# Patient Record
Sex: Male | Born: 1940
Health system: Southern US, Community
[De-identification: ages and names within clinical notes are randomized; demographics above are authoritative.]

## PROBLEM LIST (undated history)

## (undated) DIAGNOSIS — C4491 Basal cell carcinoma of skin, unspecified: Secondary | ICD-10-CM

## (undated) DIAGNOSIS — Z87891 Personal history of nicotine dependence: Secondary | ICD-10-CM

## (undated) DIAGNOSIS — N529 Male erectile dysfunction, unspecified: Secondary | ICD-10-CM

## (undated) DIAGNOSIS — I10 Essential (primary) hypertension: Secondary | ICD-10-CM

## (undated) DIAGNOSIS — N200 Calculus of kidney: Secondary | ICD-10-CM

## (undated) DIAGNOSIS — K219 Gastro-esophageal reflux disease without esophagitis: Secondary | ICD-10-CM

## (undated) DIAGNOSIS — M1990 Unspecified osteoarthritis, unspecified site: Secondary | ICD-10-CM

## (undated) DIAGNOSIS — E785 Hyperlipidemia, unspecified: Secondary | ICD-10-CM

## (undated) HISTORY — PX: COLONOSCOPY: SHX174

## (undated) HISTORY — DX: Male erectile dysfunction, unspecified: N52.9

## (undated) HISTORY — DX: Unspecified osteoarthritis, unspecified site: M19.90

## (undated) HISTORY — DX: Personal history of nicotine dependence: Z87.891

## (undated) HISTORY — PX: KNEE ARTHROSCOPY: SUR90

## (undated) HISTORY — PX: UPPER GASTROINTESTINAL ENDOSCOPY: SHX188

## (undated) HISTORY — DX: Calculus of kidney: N20.0

## (undated) HISTORY — PX: APPENDECTOMY: SHX54

## (undated) HISTORY — DX: Hyperlipidemia, unspecified: E78.5

## (undated) HISTORY — DX: Essential (primary) hypertension: I10

## (undated) HISTORY — DX: Basal cell carcinoma of skin, unspecified: C44.91

## (undated) HISTORY — DX: Gastro-esophageal reflux disease without esophagitis: K21.9

---

## 2002-12-26 ENCOUNTER — Ambulatory Visit (HOSPITAL_COMMUNITY): Admission: RE | Admit: 2002-12-26 | Discharge: 2002-12-26 | Payer: Self-pay | Admitting: Gastroenterology

## 2006-05-31 ENCOUNTER — Ambulatory Visit: Payer: Self-pay | Admitting: Family Medicine

## 2006-10-03 ENCOUNTER — Ambulatory Visit: Payer: Self-pay | Admitting: Family Medicine

## 2007-01-24 ENCOUNTER — Ambulatory Visit: Payer: Self-pay | Admitting: Family Medicine

## 2007-05-24 ENCOUNTER — Ambulatory Visit: Payer: Self-pay | Admitting: Family Medicine

## 2007-10-01 ENCOUNTER — Ambulatory Visit: Payer: Self-pay | Admitting: Family Medicine

## 2008-01-08 ENCOUNTER — Ambulatory Visit: Payer: Self-pay | Admitting: Family Medicine

## 2008-04-07 ENCOUNTER — Ambulatory Visit: Payer: Self-pay | Admitting: Family Medicine

## 2008-07-08 ENCOUNTER — Ambulatory Visit: Payer: Self-pay | Admitting: Family Medicine

## 2008-11-02 ENCOUNTER — Ambulatory Visit: Payer: Self-pay | Admitting: Family Medicine

## 2009-02-11 DEATH — deceased

## 2009-02-17 ENCOUNTER — Ambulatory Visit: Payer: Self-pay | Admitting: Family Medicine

## 2009-07-20 ENCOUNTER — Ambulatory Visit: Payer: Self-pay | Admitting: Family Medicine

## 2009-08-25 ENCOUNTER — Ambulatory Visit: Payer: Self-pay | Admitting: Family Medicine

## 2009-10-25 ENCOUNTER — Ambulatory Visit: Payer: Self-pay | Admitting: Family Medicine

## 2009-10-26 ENCOUNTER — Ambulatory Visit: Payer: Self-pay | Admitting: Vascular Surgery

## 2009-11-03 ENCOUNTER — Ambulatory Visit: Payer: Self-pay | Admitting: Family Medicine

## 2010-02-23 ENCOUNTER — Ambulatory Visit: Payer: Self-pay | Admitting: Family Medicine

## 2010-06-24 ENCOUNTER — Ambulatory Visit: Payer: Self-pay | Admitting: Family Medicine

## 2010-07-14 DEATH — deceased

## 2010-09-28 ENCOUNTER — Emergency Department (HOSPITAL_COMMUNITY): Admission: EM | Admit: 2010-09-28 | Discharge: 2010-09-28 | Payer: Self-pay | Admitting: Family Medicine

## 2010-10-26 ENCOUNTER — Ambulatory Visit: Payer: Self-pay | Admitting: Family Medicine

## 2011-02-21 ENCOUNTER — Encounter (INDEPENDENT_AMBULATORY_CARE_PROVIDER_SITE_OTHER): Payer: 59 | Admitting: Family Medicine

## 2011-02-21 DIAGNOSIS — E785 Hyperlipidemia, unspecified: Secondary | ICD-10-CM

## 2011-02-21 DIAGNOSIS — I10 Essential (primary) hypertension: Secondary | ICD-10-CM

## 2011-02-21 DIAGNOSIS — E119 Type 2 diabetes mellitus without complications: Secondary | ICD-10-CM

## 2011-03-31 NOTE — Op Note (Signed)
   NAME:  Jim Clarke, Jim Clarke                           ACCOUNT NO.:  [id number]   MEDICAL RECORD NO.:  [id number]                   PATIENT TYPE:  AMB   LOCATION:  ENDO                                 FACILITY:  [facility]   PHYSICIAN:  John C. Madilyn Fireman, M.D.                 DATE OF BIRTH:  11-03-1941   DATE OF PROCEDURE:  12/26/2002  DATE OF DISCHARGE:                                 OPERATIVE REPORT   PROCEDURE PERFORMED:  Colonoscopy.   ENDOSCOPIST:  Barrie Folk, M.D.   INDICATIONS FOR PROCEDURE:  Family history of colon cancer in a first degree  relative.   DESCRIPTION OF PROCEDURE:  The patient was placed in the left lateral  decubitus position and placed on the pulse monitor with continuous low-flow  oxygen delivered by nasal cannula.  The patient was sedated with  100 mcg of  IV fentanyl and 10 mg of IV Versed.  The Olympus video colonoscope was  inserted into the rectum and advanced to the cecum, confirmed by  transillumination of McBurney's point and visualization of the ileocecal  valve and appendiceal orifice.  The prep was excellent.  The cecum,  ascending, transverse, descending and sigmoid colon all appeared normal with  no masses, polyps, diverticula or other mucosal abnormalities.  The rectum  likewise appeared normal and retroflex view of the anus revealed no obvious  internal hemorrhoids.  The colonoscope was then withdrawn and the patient  returned to the recovery room in stable condition.  He tolerated the  procedure well.  There were no immediate complications.   IMPRESSION:  Normal colonoscopy.   PLAN:  Based on his family history, will repeat study in 5 years.                                                John C. Madilyn Fireman, M.D.    JCH/MEDQ  D:  12/26/2002  T:  12/26/2002  Job:  [id number]   cc:   Sharlot Gowda, M.D.  [address] [place], [place] [address]  Fax: [phone]

## 2011-06-26 ENCOUNTER — Encounter: Payer: Self-pay | Admitting: Family Medicine

## 2011-06-27 ENCOUNTER — Telehealth: Payer: Self-pay | Admitting: Family Medicine

## 2011-06-27 ENCOUNTER — Telehealth: Payer: Self-pay

## 2011-06-27 ENCOUNTER — Ambulatory Visit (INDEPENDENT_AMBULATORY_CARE_PROVIDER_SITE_OTHER): Payer: 59 | Admitting: Family Medicine

## 2011-06-27 ENCOUNTER — Encounter: Payer: Self-pay | Admitting: Family Medicine

## 2011-06-27 DIAGNOSIS — E785 Hyperlipidemia, unspecified: Secondary | ICD-10-CM

## 2011-06-27 DIAGNOSIS — E1169 Type 2 diabetes mellitus with other specified complication: Secondary | ICD-10-CM

## 2011-06-27 DIAGNOSIS — E119 Type 2 diabetes mellitus without complications: Secondary | ICD-10-CM

## 2011-06-27 DIAGNOSIS — I152 Hypertension secondary to endocrine disorders: Secondary | ICD-10-CM | POA: Insufficient documentation

## 2011-06-27 DIAGNOSIS — Z79899 Other long term (current) drug therapy: Secondary | ICD-10-CM

## 2011-06-27 DIAGNOSIS — I10 Essential (primary) hypertension: Secondary | ICD-10-CM

## 2011-06-27 DIAGNOSIS — N529 Male erectile dysfunction, unspecified: Secondary | ICD-10-CM | POA: Insufficient documentation

## 2011-06-27 DIAGNOSIS — M720 Palmar fascial fibromatosis [Dupuytren]: Secondary | ICD-10-CM

## 2011-06-27 DIAGNOSIS — E1159 Type 2 diabetes mellitus with other circulatory complications: Secondary | ICD-10-CM | POA: Insufficient documentation

## 2011-06-27 MED ORDER — NIACIN ER (ANTIHYPERLIPIDEMIC) 750 MG PO TBCR: 750.0000 mg | EXTENDED_RELEASE_TABLET | Freq: Every day | ORAL | Status: DC

## 2011-06-27 MED ORDER — ATORVASTATIN CALCIUM 80 MG PO TABS: 80.0000 mg | ORAL_TABLET | Freq: Every day | ORAL | Status: DC

## 2011-06-27 MED ORDER — LISINOPRIL 10 MG PO TABS: 10.0000 mg | ORAL_TABLET | Freq: Every day | ORAL | Status: DC

## 2011-06-27 MED ORDER — METFORMIN HCL 850 MG PO TABS: 850.0000 mg | ORAL_TABLET | Freq: Two times a day (BID) | ORAL | Status: DC

## 2011-06-27 NOTE — Telephone Encounter (Signed)
Jim Clarke called he said what he ment about the niaspan was there something similar to it  He said please don't call a RX in for niaspan to [facility] please write an RX so he can find some where cheaper

## 2011-06-27 NOTE — Telephone Encounter (Signed)
Left message for pt that there is no gen. For nispan

## 2011-06-27 NOTE — Telephone Encounter (Signed)
Let him know there is no generic for Niaspan

## 2011-06-27 NOTE — Progress Notes (Signed)
  Subjective:    Patient ID: Jim Clarke, male    DOB: 12/06/1940, 70 y.o.   MRN: [id number]  HPI He is here for recheck. He does state he checks his blood sugars and they run in the low 100s before meals and in the 180 range after meals. He has had an eye exam. He continues on medications listed in the chart. Presently he is on no is the medications. He does not smoke but does drink socially. His exercise is minimal .He would like some of his medications readjusted to help with cost. He also complains of some lesions in the palm of both hands.   Review of Systems  negative except as above    Objective:   Physical Exam Hemoglobin A1c is 8.1. Exam of his feet shows poor pulses. Ankle reflexes are diminished. Skin is normal. Sensation and vibratory is normal. Early contractures are noted on the palmar surface of both hands.       Assessment & Plan:  Diabetes. Hypertension. Hyperlipidemia. ED. Early Dupuytren's contracture. Recommend followup on the hands if he notes more Contracture causing difficulty with full use of his hands. I will switch him to metformin 850 twice a day and lisinopril. He is not interested in any EEG medications. Encouraged him to get his blood sugars at least 30 points lower. New glucometer was given. Recheck here 4 months

## 2011-06-27 NOTE — Telephone Encounter (Signed)
Just wanted to make sure you got this. 

## 2011-06-27 NOTE — Telephone Encounter (Signed)
OK 

## 2011-06-27 NOTE — Patient Instructions (Signed)
Work on getting those blood sugars down for least 30 points lower on average. Make some changes in your carbohydrate intake(white food)

## 2011-06-28 ENCOUNTER — Telehealth: Payer: Self-pay | Admitting: Family Medicine

## 2011-06-28 LAB — COMPREHENSIVE METABOLIC PANEL
Albumin: 3.5 g/dL (ref 3.5–5.2)
BUN: 16 mg/dL (ref 6–23)
CO2: 26 mEq/L (ref 19–32)
Calcium: 9 mg/dL (ref 8.4–10.5)
Chloride: 99 mEq/L (ref 96–112)
Glucose, Bld: 153 mg/dL — ABNORMAL HIGH (ref 70–99)
Potassium: 3.9 mEq/L (ref 3.5–5.3)
Sodium: 135 mEq/L (ref 135–145)
Total Protein: 6.3 g/dL (ref 6.0–8.3)

## 2011-06-28 LAB — CBC WITH DIFFERENTIAL/PLATELET
Eosinophils Absolute: 0.1 10*3/uL (ref 0.0–0.7)
Eosinophils Relative: 2 % (ref 0–5)
Hemoglobin: 14.9 g/dL (ref 13.0–17.0)
Lymphocytes Relative: 29 % (ref 12–46)
Lymphs Abs: 1.6 10*3/uL (ref 0.7–4.0)
MCH: 33.9 pg (ref 26.0–34.0)
MCV: 99.1 fL (ref 78.0–100.0)
Monocytes Relative: 7 % (ref 3–12)
Neutrophils Relative %: 61 % (ref 43–77)
RBC: 4.39 MIL/uL (ref 4.22–5.81)

## 2011-06-28 LAB — LIPID PANEL
Cholesterol: 103 mg/dL (ref 0–200)
HDL: 35 mg/dL — ABNORMAL LOW (ref >39)
LDL Cholesterol: 47 mg/dL (ref 0–99)
Triglycerides: 104 mg/dL (ref <150)

## 2011-06-28 NOTE — Telephone Encounter (Signed)
No equivalent med is available

## 2011-06-28 NOTE — Telephone Encounter (Signed)
Left message there is nothing compretable

## 2011-06-28 NOTE — Progress Notes (Signed)
Mailed results as pt request

## 2011-07-10 ENCOUNTER — Other Ambulatory Visit: Payer: Self-pay

## 2011-07-10 MED ORDER — PRAVASTATIN SODIUM 40 MG PO TABS: 40.0000 mg | ORAL_TABLET | Freq: Every evening | ORAL | Status: DC

## 2011-07-18 ENCOUNTER — Telehealth: Payer: Self-pay | Admitting: Family Medicine

## 2011-07-18 NOTE — Telephone Encounter (Signed)
Had to send notes per medicare to [facility]

## 2011-07-24 ENCOUNTER — Telehealth: Payer: Self-pay | Admitting: Family Medicine

## 2011-07-24 MED ORDER — NIACIN ER (ANTIHYPERLIPIDEMIC) 750 MG PO TBCR: 750.0000 mg | EXTENDED_RELEASE_TABLET | Freq: Every day | ORAL | Status: DC

## 2011-07-24 NOTE — Telephone Encounter (Signed)
PT STATES HE HAS BEEN TESTING HIS BS THREE TIMES A DAY.  FIRST THING IN THE AM, 2 HOURS AFTER LUNCH AND RIGHT BEFORE BED.  HE STATES THAT THE LAST TIME HE WAS IN HIS BS WERE HIGH AND YOU GAVE HIM A NEW METER AND TOLD HIM TO TEST.  THE PHARMACY NOW WANTS A LETTER STATED WHY IT IS NECESSARY TO TEST THREE TIMES A DAY.  pT STATES HE IS TRYING TO KEEP A CLOSE CHECK ON HIS BS SO HE CAN KEEP IN UNDER CONTROL.   PT ALSO NEEDS A REFILL SENT TO [facility] FOR NIASPAN. NEEDS 90 DAY SUPPLY. THIS PHARM IS FOR THIS MEDICATION ONLY. FOR RIGHT NOW ANYWAY.

## 2011-07-24 NOTE — Telephone Encounter (Signed)
I called the niacin in. Let him know that he can cut back on his blood sugar readings to one per day but alternate when he tests them

## 2011-07-25 ENCOUNTER — Telehealth: Payer: Self-pay

## 2011-07-25 ENCOUNTER — Telehealth: Payer: Self-pay | Admitting: Family Medicine

## 2011-07-25 NOTE — Telephone Encounter (Signed)
Called pt to inform him to alternate times when testing and just do it 1 time a day talked to his wife and him

## 2011-07-25 NOTE — Telephone Encounter (Signed)
Pt called and wanted to know if form was ready from yesterday.  Please advise

## 2011-08-07 ENCOUNTER — Telehealth: Payer: Self-pay | Admitting: Family Medicine

## 2011-08-08 ENCOUNTER — Other Ambulatory Visit: Payer: Self-pay

## 2011-08-08 NOTE — Telephone Encounter (Signed)
Was looking to see if jcl had aswered the ? For pt

## 2011-08-09 NOTE — Telephone Encounter (Signed)
DONE PER JCL-LM

## 2011-08-14 ENCOUNTER — Other Ambulatory Visit: Payer: Self-pay | Admitting: Family Medicine

## 2011-08-14 MED ORDER — NIACIN ER (ANTIHYPERLIPIDEMIC) 750 MG PO TBCR: 750.0000 mg | EXTENDED_RELEASE_TABLET | Freq: Every day | ORAL | Status: DC

## 2011-09-26 ENCOUNTER — Telehealth: Payer: Self-pay | Admitting: Family Medicine

## 2011-09-26 MED ORDER — PRAVASTATIN SODIUM 40 MG PO TABS: 40.0000 mg | ORAL_TABLET | Freq: Every evening | ORAL | Status: AC

## 2011-09-26 NOTE — Telephone Encounter (Signed)
Pravachol called in.

## 2011-10-27 ENCOUNTER — Encounter: Payer: Medicare Other | Admitting: Family Medicine

## 2011-11-08 ENCOUNTER — Telehealth: Payer: Self-pay | Admitting: Internal Medicine

## 2011-11-08 MED ORDER — GLUCOSE BLOOD VI STRP: ORAL_STRIP | Status: DC

## 2011-11-08 NOTE — Telephone Encounter (Signed)
Sent in

## 2011-11-08 NOTE — Telephone Encounter (Signed)
refill request came in from [facility] for one touch ultra blue test strip # 50, they need the exact direction so they can have the right days supple. pt is out and insurance wont pay until 11/17/11. wants to know if you can have a rx for #100 with diagnosis code

## 2011-12-14 ENCOUNTER — Ambulatory Visit (INDEPENDENT_AMBULATORY_CARE_PROVIDER_SITE_OTHER): Payer: Self-pay | Admitting: Family Medicine

## 2011-12-14 VITALS — BP 114/70 | HR 102 | Ht 66.5 in | Wt 200.0 lb

## 2011-12-14 DIAGNOSIS — J209 Acute bronchitis, unspecified: Secondary | ICD-10-CM

## 2011-12-14 DIAGNOSIS — H1030 Unspecified acute conjunctivitis, unspecified eye: Secondary | ICD-10-CM

## 2011-12-14 DIAGNOSIS — N529 Male erectile dysfunction, unspecified: Secondary | ICD-10-CM

## 2011-12-14 DIAGNOSIS — H1033 Unspecified acute conjunctivitis, bilateral: Secondary | ICD-10-CM

## 2011-12-14 MED ORDER — AMOXICILLIN 875 MG PO TABS: 875.0000 mg | ORAL_TABLET | Freq: Two times a day (BID) | ORAL | Status: AC

## 2011-12-14 MED ORDER — VARDENAFIL HCL 20 MG PO TABS: 20.0000 mg | ORAL_TABLET | Freq: Every day | ORAL | Status: AC | PRN

## 2011-12-14 NOTE — Progress Notes (Signed)
  Subjective:    Patient ID: Jim Clarke, male    DOB: [birth year], 71 y.o.   MRN: [id number]  HPI He complains of a several week history of cough, congestion but no sore throat, earache. In the last several days he has also had some drainage from both eyes as well as now redness in both eyes. He also has erectile dysfunction and would like to try another ED medicine.  Review of Systems     Objective:   Physical Exam alert and in no distress. Tympanic membranes and canals are normal. Throat is clear. Tonsils are normal. Neck is supple without adenopathy or thyromegaly. Cardiac exam shows a regular sinus rhythm without murmurs or gallops. Lungs are clear to auscultation. Both conjunctivae are injected.       Assessment & Plan:   1. Conjunctivitis, acute, bilateral   2. Bronchitis, acute   3. ED (erectile dysfunction)    I will place him on Amoxil. Also wrote a prescription for Levitra. He will call if further difficulty.

## 2011-12-14 NOTE — Patient Instructions (Signed)
Take all the antibiotic and if not totally back to normal call me 

## 2011-12-20 ENCOUNTER — Telehealth: Payer: Self-pay | Admitting: Internal Medicine

## 2011-12-21 ENCOUNTER — Other Ambulatory Visit: Payer: Self-pay

## 2011-12-21 MED ORDER — GLUCOSE BLOOD VI STRP: ORAL_STRIP | Status: DC

## 2011-12-21 NOTE — Telephone Encounter (Signed)
Sent in 90 days  

## 2011-12-21 NOTE — Telephone Encounter (Signed)
Sent in for 90 days strips

## 2012-01-29 ENCOUNTER — Telehealth: Payer: Self-pay | Admitting: Family Medicine

## 2012-01-29 MED ORDER — METFORMIN HCL 1000 MG PO TABS: 1000.0000 mg | ORAL_TABLET | Freq: Two times a day (BID) | ORAL | Status: DC

## 2012-01-29 NOTE — Telephone Encounter (Signed)
Metformin 90 day supply called in

## 2012-01-29 NOTE — Telephone Encounter (Signed)
Pt needs refill on metformin. Pt takes 1000 mg twice a day. [facility] uses [facility] [address]. Please call in 90 days worth much cheaper for him that way.

## 2012-05-28 ENCOUNTER — Encounter (HOSPITAL_COMMUNITY): Payer: Self-pay | Admitting: *Deleted

## 2012-05-28 ENCOUNTER — Emergency Department (HOSPITAL_COMMUNITY)
Admission: EM | Admit: 2012-05-28 | Discharge: 2012-05-28 | Disposition: A | Discharge status: Home / Self Care | Payer: Medicare Other | Attending: Emergency Medicine | Admitting: Emergency Medicine

## 2012-05-28 DIAGNOSIS — I10 Essential (primary) hypertension: Secondary | ICD-10-CM | POA: Insufficient documentation

## 2012-05-28 DIAGNOSIS — E119 Type 2 diabetes mellitus without complications: Secondary | ICD-10-CM | POA: Insufficient documentation

## 2012-05-28 DIAGNOSIS — Z79899 Other long term (current) drug therapy: Secondary | ICD-10-CM | POA: Insufficient documentation

## 2012-05-28 DIAGNOSIS — K529 Noninfective gastroenteritis and colitis, unspecified: Secondary | ICD-10-CM

## 2012-05-28 DIAGNOSIS — R5381 Other malaise: Secondary | ICD-10-CM | POA: Insufficient documentation

## 2012-05-28 DIAGNOSIS — R112 Nausea with vomiting, unspecified: Secondary | ICD-10-CM | POA: Insufficient documentation

## 2012-05-28 DIAGNOSIS — R109 Unspecified abdominal pain: Secondary | ICD-10-CM | POA: Insufficient documentation

## 2012-05-28 DIAGNOSIS — K219 Gastro-esophageal reflux disease without esophagitis: Secondary | ICD-10-CM | POA: Insufficient documentation

## 2012-05-28 DIAGNOSIS — E785 Hyperlipidemia, unspecified: Secondary | ICD-10-CM | POA: Insufficient documentation

## 2012-05-28 LAB — CARDIAC PANEL(CRET KIN+CKTOT+MB+TROPI)
CK, MB: 2.7 ng/mL (ref 0.3–4.0)
Troponin I: <0.3 ng/mL (ref <0.30)

## 2012-05-28 LAB — CBC WITH DIFFERENTIAL/PLATELET
Basophils Relative: 0 % (ref 0–1)
Eosinophils Absolute: 0 10*3/uL (ref 0.0–0.7)
Eosinophils Relative: 0 % (ref 0–5)
Hemoglobin: 13.9 g/dL (ref 13.0–17.0)
MCH: 33.7 pg (ref 26.0–34.0)
MCHC: 34.9 g/dL (ref 30.0–36.0)
Monocytes Relative: 8 % (ref 3–12)
Neutrophils Relative %: 85 % — ABNORMAL HIGH (ref 43–77)
Platelets: 126 10*3/uL — ABNORMAL LOW (ref 150–400)

## 2012-05-28 LAB — URINALYSIS, ROUTINE W REFLEX MICROSCOPIC
Hgb urine dipstick: NEGATIVE
Leukocytes, UA: NEGATIVE
Nitrite: NEGATIVE
Protein, ur: NEGATIVE mg/dL
Specific Gravity, Urine: 1.018 (ref 1.005–1.030)
Urobilinogen, UA: 0.2 mg/dL (ref 0.0–1.0)

## 2012-05-28 LAB — GLUCOSE, CAPILLARY: Glucose-Capillary: 127 mg/dL — ABNORMAL HIGH (ref 70–99)

## 2012-05-28 LAB — COMPREHENSIVE METABOLIC PANEL
Albumin: 3.8 g/dL (ref 3.5–5.2)
Alkaline Phosphatase: 50 U/L (ref 39–117)
BUN: 29 mg/dL — ABNORMAL HIGH (ref 6–23)
Calcium: 9.4 mg/dL (ref 8.4–10.5)
Potassium: 4.4 mEq/L (ref 3.5–5.1)
Total Protein: 6.6 g/dL (ref 6.0–8.3)

## 2012-05-28 LAB — LIPASE, BLOOD: Lipase: 54 U/L (ref 11–59)

## 2012-05-28 LAB — LACTIC ACID, PLASMA
Lactic Acid, Venous: 2.7 mmol/L — ABNORMAL HIGH (ref 0.5–2.2)
Lactic Acid, Venous: 1.4 mmol/L (ref 0.5–2.2)

## 2012-05-28 MED ORDER — SODIUM CHLORIDE 0.9 % IV BOLUS (SEPSIS)
1000.0000 mL | Freq: Once | INTRAVENOUS | Status: AC
  Administered 2012-05-28: 1000 mL via INTRAVENOUS

## 2012-05-28 MED ORDER — SPHYGMOMANOMETER MISC: 1.0000 | Freq: Every morning | Status: AC

## 2012-05-28 MED ORDER — ONDANSETRON HCL 4 MG/2ML IJ SOLN: 4.0000 mg | Freq: Once | INTRAMUSCULAR | Status: DC

## 2012-05-28 MED ORDER — ONDANSETRON HCL 4 MG PO TABS: 4.0000 mg | ORAL_TABLET | Freq: Four times a day (QID) | ORAL | Status: AC

## 2012-05-28 NOTE — ED Notes (Signed)
Family at bedside. 

## 2012-05-28 NOTE — ED Notes (Signed)
Pt. Ambulate with assit when he stood up he was alittle  Unsteady.but walkin to cdu he was steady

## 2012-05-28 NOTE — ED Notes (Signed)
Pt woke this am with c/o nausea/vomiting x 3, c/o stomach cramping, BP 90/46, CBG 95 per EMS. IV started per EMS, #20g left [facility]

## 2012-05-28 NOTE — ED Notes (Signed)
Report received, assumed care.  

## 2012-05-28 NOTE — ED Provider Notes (Signed)
History     CSN: [id number]  Arrival date & time 05/28/12  4098   First MD Initiated Contact with Patient 05/28/12 [phone]      Chief Complaint  Patient presents with  . Emesis    (Consider location/radiation/quality/duration/timing/severity/associated sxs/prior treatment) HPI Comments: Patient presents from home after episode of weakness, nausea, vomiting, diarrhea stomach cramping this morning. Patient states he woke feeling well and as he was leaving his house he began to feel lightheaded, nauseated and sweaty. He vomited once, had some abdominal cramping and 3 loose bowel movements. Denies any blood in the stool. No chest pain or shortness of breath. No focal weakness, numbness, tingling. Patient is a non-insulin-dependent diabetic and felt like her sugar was low.  He checked it and it was 95.  He states he had loose stools but is not: Diarrhea. Denies any blood in stool. His abdominal cramping has resolved. His nausea and vomiting have resolved.  The history is provided by the patient, the EMS personnel and the spouse.    Past Medical History  Diagnosis Date  . Arthritis   . Hypertension   . GERD (gastroesophageal reflux disease)   . Smoker   . Dyslipidemia   . Calcium oxalate renal stones   . ED (erectile dysfunction)   . Diabetes mellitus     Past Surgical History  Procedure Date  . Appendectomy   . Knee arthroscopy     RIGHT  (CARTER MD)    Family History  Problem Relation Age of Onset  . Arthritis Mother   . Mental illness Mother   . Seizures Mother   . Arthritis Father   . Cancer Father   . Diabetes Father     History  Substance Use Topics  . Smoking status: Former Games developer  . Smokeless tobacco: Never Used  . Alcohol Use: 0.6 oz/week    1 Cans of beer per week      Review of Systems  Constitutional: Positive for activity change, appetite change and fatigue. Negative for fever.  HENT: Negative for congestion and rhinorrhea.   Respiratory: Negative for  cough, chest tightness and shortness of breath.   Cardiovascular: Negative for chest pain.  Gastrointestinal: Positive for nausea, vomiting, abdominal pain and diarrhea.  Genitourinary: Negative for dysuria and hematuria.  Musculoskeletal: Negative for back pain.  Skin: Negative for rash.  Neurological: Positive for weakness. Negative for dizziness, syncope and light-headedness.    Allergies  Sulfa antibiotics  Home Medications   Current Outpatient Rx  Name Route Sig Dispense Refill  . ASPIRIN 81 MG PO TABS Oral Take 81 mg by mouth daily.      Marland Kitchen GLIPIZIDE 5 MG PO TABS Oral Take 5 mg by mouth daily.    Marland Kitchen LISINOPRIL 10 MG PO TABS Oral Take 1 tablet (10 mg total) by mouth daily. 90 tablet 4  . METFORMIN HCL 1000 MG PO TABS Oral Take 1 tablet (1,000 mg total) by mouth 2 (two) times daily with a meal. 180 tablet 1  . MULTI-VITAMIN/MINERALS PO TABS Oral Take 1 tablet by mouth daily.      Marland Kitchen NIACIN ER (ANTIHYPERLIPIDEMIC) 750 MG PO TBCR Oral Take 750 mg by mouth 2 (two) times daily.    Marland Kitchen PRAVASTATIN SODIUM 40 MG PO TABS Oral Take 1 tablet (40 mg total) by mouth every evening. 30 tablet 5  . GLUCOSE BLOOD VI STRP  Use as instructed pt is to test two times daily 250.00 300 each prn    One  touch ultra    BP 95/43  Pulse 87  Temp 97.7 F (36.5 C) (Oral)  Resp 16  Ht 5\' 7"  (1.702 m)  Wt 187 lb (84.823 kg)  BMI 29.29 kg/m2  SpO2 96%  Physical Exam  Constitutional: He is oriented to person, place, and time. He appears well-developed and well-nourished. No distress.  HENT:  Head: Normocephalic and atraumatic.  Mouth/Throat: Oropharynx is clear and moist. No oropharyngeal exudate.  Eyes: Conjunctivae are normal. Pupils are equal, round, and reactive to light.  Neck: Normal range of motion. Neck supple.  Cardiovascular: Normal rate, regular rhythm and normal heart sounds.   Pulmonary/Chest: Breath sounds normal. No respiratory distress.  Abdominal: Soft. There is no tenderness. There is  no rebound and no guarding.  Musculoskeletal: Normal range of motion. He exhibits no edema and no tenderness.  Neurological: He is alert and oriented to person, place, and time. No cranial nerve deficit.       5 out of 5 strength throughout, no cranial nerve deficit, no ataxia on finger to Nose, no nystagmus  Skin: Skin is warm.    ED Course  Procedures (including critical care time)  Labs Reviewed  CBC WITH DIFFERENTIAL - Abnormal; Notable for the following:    WBC 14.2 (*)     RBC 4.13 (*)     Platelets 126 (*)     Neutrophils Relative 85 (*)     Neutro Abs 12.1 (*)     Lymphocytes Relative 6 (*)     Monocytes Absolute 1.2 (*)     All other components within normal limits  COMPREHENSIVE METABOLIC PANEL - Abnormal; Notable for the following:    Glucose, Bld 127 (*)     BUN 29 (*)     GFR calc non Af Amer 61 (*)     GFR calc Af Amer 71 (*)     All other components within normal limits  LACTIC ACID, PLASMA - Abnormal; Notable for the following:    Lactic Acid, Venous 2.7 (*)     All other components within normal limits  URINALYSIS, ROUTINE W REFLEX MICROSCOPIC - Abnormal; Notable for the following:    Ketones, ur 15 (*)     All other components within normal limits  GLUCOSE, CAPILLARY - Abnormal; Notable for the following:    Glucose-Capillary 127 (*)     All other components within normal limits  CARDIAC PANEL(CRET KIN+CKTOT+MB+TROPI)  LIPASE, BLOOD  LACTIC ACID, PLASMA  CARDIAC PANEL(CRET KIN+CKTOT+MB+TROPI)   No results found.   No diagnosis found.    MDM  Nausea vomiting abdominal cramping and loose stools. Now resolved. No chest pain or shortness of breath. Patient normoglycemic.  ACS on differential the low suspicion given diarrhea and abdominal cramping. EKG nonischemic with PVCs. Labs remarkable for leukocytosis, ketones in urine Orthostatic vitals positive. Patient states his normal blood pressure is in high 80-90 range. There's been no change in his  medications. He denies any chest pain, dizziness, lightheadedness, nausea or vomiting.  Continue IVF, repeat lactate, cardiac enzymes in CDU.   Date: 05/28/2012  Rate: 86  Rhythm: normal sinus rhythm and premature ventricular contractions (PVC)  QRS Axis: normal  Intervals: normal  ST/T Wave abnormalities: normal  Conduction Disutrbances:none  Narrative Interpretation:   Old EKG Reviewed: none available    Glynn Octave, MD 05/28/12 1631

## 2012-05-28 NOTE — ED Notes (Signed)
Patient ambulated from pod a to cdu . Gait steady. Pt talkative and laughing with tech ida.

## 2012-05-28 NOTE — ED Notes (Signed)
MEAL ORDERED 

## 2012-05-28 NOTE — ED Notes (Signed)
Checked patient blood sugar it was 127 notified [employer] of [facility]

## 2012-05-28 NOTE — ED Provider Notes (Signed)
Medical screening examination/treatment/procedure(s) were performed by non-physician practitioner and as supervising physician I was immediately available for consultation/collaboration.  Cheri Guppy, MD 05/28/12 1550

## 2012-05-28 NOTE — ED Notes (Addendum)
Sudden onset n/v, diaphoresis, "stomach cramping", BM x3. Denies diarrhea.  Reports CBG 95 this am but normally it runs in the 150's. States did not take diabetes meds this morning

## 2012-05-28 NOTE — ED Provider Notes (Signed)
Feeling better, tolerating PO fluid. Orthostatics, lactic acid, and second trop are negative. Discussed discharge and patient and family are comfortable.   Rodena Medin, PA-C 05/28/12 1322

## 2012-05-29 ENCOUNTER — Encounter: Payer: Self-pay | Admitting: Medical

## 2012-05-29 ENCOUNTER — Ambulatory Visit (INDEPENDENT_AMBULATORY_CARE_PROVIDER_SITE_OTHER): Payer: Medicare Other | Admitting: Medical

## 2012-05-29 VITALS — BP 110/58 | HR 60 | Temp 98.1°F | Resp 16 | Wt 196.0 lb

## 2012-05-29 DIAGNOSIS — I959 Hypotension, unspecified: Secondary | ICD-10-CM

## 2012-05-29 DIAGNOSIS — R110 Nausea: Secondary | ICD-10-CM

## 2012-05-29 DIAGNOSIS — K5289 Other specified noninfective gastroenteritis and colitis: Secondary | ICD-10-CM

## 2012-05-29 DIAGNOSIS — K529 Noninfective gastroenteritis and colitis, unspecified: Secondary | ICD-10-CM

## 2012-05-29 NOTE — Progress Notes (Signed)
Subjective: Here for hospital f/u.  Prior to yesterday morning had been in his normal state of health.  Went to the ED yesterday after he was walking down the stairs in his house, and suddenly felt sweats, nausea, and chills. He had a few episodes of vomiting and loose stools yesterday.   He felt so bad that EMS was called.  After 2.5 liters of IV fluids, they discharged him home with planned f/u here today.   He feels fine today.  Denies any similar symptoms today.   He is concerned about his BP medication Lisinopril 10mg  begin too strong.  He notes a 50lb weight loss in the last year intentional through diet and exercise.  He has been checking his BP at the [facility] often in the past weeks and routinely getting 80-90 SBP, and 50-60 DBPs.  He checked his glucose yesterday when he felt the episode of sweats and chills, but sugar was 95.  He has been checking glucose twice daily, morning fasting and either after lunch or dinner.  Typically gets 120-160s in the mornings.  2 hours post prandial 90-160.  He has not taken his BP medication today and thinks it should be changed.  No other c/o.  In general he exercises 5 days per week with 30 min on the stationary bike, 1 hour of treadmill with no chest pain, DOE, SOB, or palpitations.  No prior heart problems.   No other c/o.  The following portions of the patient's history were reviewed and updated as appropriate: allergies, current medications, past family history, past medical history, past social history, past surgical history and problem list.  Past Medical History  Diagnosis Date  . Arthritis   . Hypertension   . GERD (gastroesophageal reflux disease)   . Smoker   . Dyslipidemia   . Calcium oxalate renal stones   . ED (erectile dysfunction)   . Diabetes mellitus     Allergies  Allergen Reactions  . Sulfa Antibiotics    Review of Systems Constitutional: -fever, -chills, -sweats, -unexpected -weight change,-fatigue ENT: -runny nose, -ear pain,  -sore throat Cardiology:  -chest pain, -palpitations, -edema Respiratory: -cough, -shortness of breath, -wheezing Gastroenterology: -abdominal pain, -nausea, -vomiting, -diarrhea, -constipation Musculoskeletal: -arthralgias, -myalgias, -joint swelling, -back pain Ophthalmology: -vision changes Urology: -dysuria, -difficulty urinating, -hematuria, -urinary frequency, -urgency Neurology: -headache, -weakness, -tingling, -numbness       Objective:   Physical Exam  General appearance: alert, no distress, WD/WN Eyes: mild pallor of conjunctiva Skin; slight decreased skin turgor Oral cavity: MMM, no lesions Neck: supple, no lymphadenopathy, no thyromegaly, no masses Heart: RRR, normal S1, S2, no murmurs Lungs: CTA bilaterally, no wheezes, rhonchi, or rales Abdomen: +bs, soft, non tender, non distended, no masses, no hepatomegaly, no splenomegaly Pulses: 2+ symmetric, upper and lower extremities, normal cap refill Ext: no edema   Assessment and Plan :     Encounter Diagnoses  Name Primary?  . Hypotension Yes  . Gastroenteritis   . Nausea    Reviewed hospital ED notes from yesterday, labs from yesterday.   Advised that he may have just had a 24 hours viral gastroenteritis.  His nausea has resolved, he feels fine today.  I advised that given recent low BP readings in the last month or so, and given intentional 50+ lb weight loss in the last year, we will back off to Lisinopril 5mg  daily or 1/2 of the 10mg  daily.  Advised if any new symptoms in the next week, call or return.  Otherwise,  hydrate well, check BP and glucose, log these numbers and recheck in 16mo.

## 2012-05-30 ENCOUNTER — Telehealth: Payer: Self-pay | Admitting: Medical

## 2012-05-30 NOTE — Telephone Encounter (Signed)
If he normally takes the 5 mg pill, bats flying. We just need to document this so check with him to verify this.

## 2012-05-30 NOTE — Telephone Encounter (Signed)
Called pt to let him know its ok

## 2012-05-30 NOTE — Telephone Encounter (Signed)
Left message for pt to call me back if he wants 5mg  pill called in

## 2012-06-12 ENCOUNTER — Encounter: Payer: Self-pay | Admitting: Medical

## 2012-06-27 ENCOUNTER — Encounter: Payer: Self-pay | Admitting: Internal Medicine

## 2012-07-01 ENCOUNTER — Encounter: Payer: Self-pay | Admitting: Medical

## 2012-07-01 ENCOUNTER — Ambulatory Visit (INDEPENDENT_AMBULATORY_CARE_PROVIDER_SITE_OTHER): Payer: Medicare Other | Admitting: Medical

## 2012-07-01 VITALS — BP 118/70 | HR 78 | Temp 98.0°F | Wt 190.0 lb

## 2012-07-01 DIAGNOSIS — R05 Cough: Secondary | ICD-10-CM

## 2012-07-01 DIAGNOSIS — E785 Hyperlipidemia, unspecified: Secondary | ICD-10-CM

## 2012-07-01 DIAGNOSIS — I10 Essential (primary) hypertension: Secondary | ICD-10-CM

## 2012-07-01 DIAGNOSIS — E118 Type 2 diabetes mellitus with unspecified complications: Secondary | ICD-10-CM | POA: Insufficient documentation

## 2012-07-01 DIAGNOSIS — R059 Cough, unspecified: Secondary | ICD-10-CM

## 2012-07-01 DIAGNOSIS — E119 Type 2 diabetes mellitus without complications: Secondary | ICD-10-CM

## 2012-07-01 DIAGNOSIS — E1169 Type 2 diabetes mellitus with other specified complication: Secondary | ICD-10-CM | POA: Insufficient documentation

## 2012-07-01 DIAGNOSIS — H6120 Impacted cerumen, unspecified ear: Secondary | ICD-10-CM

## 2012-07-01 NOTE — Progress Notes (Signed)
Subjective:   HPI  Jim Clarke is a 71 y.o. male who presents for general recheck.  He has been taking Lisinopril 5mg  without c/o.  Feeling fine . He notes 52lb weight loss over the past year intentionally.  He uses stationary bike daily and 60 min walking or running daily.  Eats healthy, no white bread.  He brought his recent BP and sugar readings with him.  BP numbers fine, but lately due to stressors - family member with health scare, his numbers were elevated some.  Overall though he tries to maintaining a healthy lifestyle.  He goes to the [place] [facility] next month for labs.  Here mainly for recheck on BP and cholesterol and diabetes.  No other c/o.  The following portions of the patient's history were reviewed and updated as appropriate: allergies, current medications, past family history, past medical history, past social history, past surgical history and problem list.  Past Medical History  Diagnosis Date  . Arthritis   . Hypertension   . GERD (gastroesophageal reflux disease)   . Dyslipidemia   . Calcium oxalate renal stones   . ED (erectile dysfunction)   . Diabetes mellitus   . Former smoker     quit years ago as of 2013    Allergies  Allergen Reactions  . Sulfa Antibiotics      Review of Systems ROS reviewed and was negative other than noted in HPI or above.    Objective:   Physical Exam  General appearance: alert, no distress, WD/WN HEENT: normocephalic, sclerae anicteric, impacted cermun in both ear canals, nares patent, no discharge or erythema, pharynx normal Oral cavity: MMM, no lesions Neck: supple, no lymphadenopathy, no thyromegaly, no masses Heart: RRR, normal S1, S2, no murmurs Lungs: CTA bilaterally, no wheezes, rhonchi, or rales Abdomen: +bs, soft, non tender, non distended, no masses, no hepatomegaly, no splenomegaly Pulses: 2+ symmetric, upper and lower extremities, normal cap refill Ext: no edema   Assessment and Plan :     Encounter  Diagnoses  Name Primary?  . Essential hypertension, benign Yes  . Type II or unspecified type diabetes mellitus without mention of complication, not stated as uncontrolled   . Hyperlipidemia   . Cough   . Impacted cerumen     HTN - controlled, doing well on Lisinopril 5mg  daily. C/t his exercise regimen and eat healthy diabetic low fat diet.   DM type II - reviewed home numbers.  C/t Metformin 1000 BID, c/t Glipizide 5mg  daily.    Hyperlipidemia - taking Niaspan and Pravastatin.  He will have labs in early September at the [place].  Script for labs given.  Cough - begin Zyrtec OTC. If cough lingering another 2-3 wk, may need CXR.  Impacted cerumen - offered ear lavage, but he will use home remedy

## 2012-08-22 ENCOUNTER — Telehealth: Payer: Self-pay | Admitting: Internal Medicine

## 2012-08-22 MED ORDER — GLUCOSE BLOOD VI STRP: ORAL_STRIP | Status: AC

## 2012-08-22 NOTE — Telephone Encounter (Signed)
Sent test strips in 

## 2012-08-22 NOTE — Telephone Encounter (Signed)
Refill request for one touch ultra blue test strep to [facility]

## 2012-12-24 ENCOUNTER — Other Ambulatory Visit: Payer: Self-pay | Admitting: Family Medicine

## 2013-02-20 ENCOUNTER — Telehealth: Payer: Self-pay | Admitting: Family Medicine

## 2013-02-20 MED ORDER — ONETOUCH ULTRA 2 W/DEVICE KIT: 1.0000 | PACK | Freq: Every day | Status: DC

## 2013-02-20 NOTE — Telephone Encounter (Signed)
SENT IN NEW METER

## 2013-02-26 ENCOUNTER — Telehealth: Payer: Self-pay | Admitting: Family Medicine

## 2013-02-26 NOTE — Telephone Encounter (Signed)
Pt called and states he has been trying to get meter, lancets and strips for 4 days now.  I called [facility] [phone] spoke with Shawna Orleans and gave her dx code and she will refill.

## 2013-07-07 NOTE — Progress Notes (Signed)
[facility] HAS BEEN GOING THERE FOR HIS REGULAR APPOINTMENT TO FU ON HIS BP SINCE 07/01/12. PT CAME HERE IN July 2013 AND AUGUST 2013 BECAUSE HE WAS HOSPITALIZED AND NEEDED TO SEE HIS LOCAL DOCTOR. PT DOES PLAN TO SCHEDULE A CPE WITH OUR SOON. HE WILL CALL BACK TO SCHEDULE THAT APPT

## 2013-07-28 ENCOUNTER — Telehealth: Payer: Self-pay | Admitting: Family Medicine

## 2013-07-28 MED ORDER — GLUCOSE BLOOD VI STRP: 1.0000 | ORAL_STRIP | Freq: Two times a day (BID) | Status: DC

## 2013-07-28 NOTE — Telephone Encounter (Signed)
Refills on test strips sent to the pharmacy. CLS

## 2013-07-28 NOTE — Telephone Encounter (Signed)
Pt called and needs refill for the One Touch Ultra II test strips #300 he test twice daily to [facility] [address]

## 2013-07-31 ENCOUNTER — Telehealth: Payer: Self-pay | Admitting: Medical

## 2013-07-31 NOTE — Telephone Encounter (Signed)
Done

## 2013-07-31 NOTE — Telephone Encounter (Signed)
Pharmacy is requesting a new script with correct sig, and diagnosis code for Medicare on One Touch Ultra Blue Test Strips #300

## 2013-08-07 ENCOUNTER — Encounter: Payer: Self-pay | Admitting: Family Medicine

## 2013-08-07 ENCOUNTER — Ambulatory Visit (INDEPENDENT_AMBULATORY_CARE_PROVIDER_SITE_OTHER): Payer: Medicare Other | Admitting: Family Medicine

## 2013-08-07 VITALS — BP 120/70 | HR 81 | Wt 179.0 lb

## 2013-08-07 DIAGNOSIS — E1159 Type 2 diabetes mellitus with other circulatory complications: Secondary | ICD-10-CM

## 2013-08-07 DIAGNOSIS — J209 Acute bronchitis, unspecified: Secondary | ICD-10-CM

## 2013-08-07 DIAGNOSIS — E1169 Type 2 diabetes mellitus with other specified complication: Secondary | ICD-10-CM

## 2013-08-07 DIAGNOSIS — I10 Essential (primary) hypertension: Secondary | ICD-10-CM

## 2013-08-07 DIAGNOSIS — E785 Hyperlipidemia, unspecified: Secondary | ICD-10-CM

## 2013-08-07 DIAGNOSIS — I152 Hypertension secondary to endocrine disorders: Secondary | ICD-10-CM

## 2013-08-07 DIAGNOSIS — E119 Type 2 diabetes mellitus without complications: Secondary | ICD-10-CM

## 2013-08-07 DIAGNOSIS — Z23 Encounter for immunization: Secondary | ICD-10-CM

## 2013-08-07 MED ORDER — AMOXICILLIN 875 MG PO TABS: 875.0000 mg | ORAL_TABLET | Freq: Two times a day (BID) | ORAL | Status: DC

## 2013-08-07 NOTE — Progress Notes (Signed)
  Subjective:    Patient ID: Jim Clarke, male    DOB: 03-15-1941, 72 y.o.   MRN: [id number]  HPI Margo Aye he has a nine-day history with nasal congestion and dry cough followed by a productive cough, sore throat no fever, chills or earache. He does not smoke and does not have allergies. Goes to the [place] and has been followed there. He has lost over 40 pounds to try and get his diabetes/hypertension and lipids under better control. They have readjusted his medications significantly. Especially in regard to his hypertensive meds and his lipids.   Review of Systems     Objective:   Physical Exam alert and in no distress. Tympanic membranes and canals are normal. Throat is clear. Tonsils are normal. Neck is supple without adenopathy or thyromegaly. Cardiac exam shows a regular sinus rhythm without murmurs or gallops. Lungs are clear to auscultation.        Assessment & Plan:  Type II or unspecified type diabetes mellitus without mention of complication, not stated as uncontrolled  Hypertension associated with diabetes  Hyperlipidemia  Diabetes mellitus  Need for prophylactic vaccination and inoculation against influenza - Plan: Flu Vaccine QUAD 36+ mos IM  Acute bronchitis - Plan: amoxicillin (AMOXIL) 875 MG tablet  I congratulated him on the blood work that he is doing with all the weight loss and change in his medications. He will continue to be followed by the [place]. I will place him on Amoxil. He is to call me if he has further difficulties.

## 2013-08-07 NOTE — Patient Instructions (Addendum)
Try NyQuil for cough and if that doesn't work call back. Take all the antibiotic and if not totally back to normal let me know. Use Afrin at night to help with breathing.

## 2013-11-21 ENCOUNTER — Telehealth: Payer: Self-pay | Admitting: Internal Medicine

## 2013-11-21 MED ORDER — GLUCOSE BLOOD VI STRP: 1.0000 | ORAL_STRIP | Freq: Two times a day (BID) | Status: DC

## 2013-11-21 NOTE — Telephone Encounter (Signed)
Pt needs a refill on one touch ultra test strips

## 2013-11-24 ENCOUNTER — Telehealth: Payer: Self-pay | Admitting: Internal Medicine

## 2013-11-24 MED ORDER — GLUCOSE BLOOD VI STRP
1.0000 | ORAL_STRIP | Freq: Two times a day (BID) | Status: DC
Start: 2013-11-24 — End: 2014-09-29

## 2013-11-24 NOTE — Telephone Encounter (Signed)
Refill request for one touch ultra blue test strips to [facility]

## 2013-11-24 NOTE — Telephone Encounter (Signed)
Strips sent into pharm. Pt aware

## 2013-12-11 ENCOUNTER — Telehealth: Payer: Self-pay | Admitting: Family Medicine

## 2013-12-11 NOTE — Telephone Encounter (Signed)
lm

## 2014-01-05 ENCOUNTER — Telehealth: Payer: Self-pay | Admitting: Family Medicine

## 2014-01-05 NOTE — Telephone Encounter (Signed)
bcbs chart review

## 2014-01-26 LAB — HM DIABETES EYE EXAM

## 2014-02-16 ENCOUNTER — Encounter: Payer: Self-pay | Admitting: Family Medicine

## 2014-02-17 ENCOUNTER — Encounter: Payer: Self-pay | Admitting: Internal Medicine

## 2014-03-10 ENCOUNTER — Ambulatory Visit (INDEPENDENT_AMBULATORY_CARE_PROVIDER_SITE_OTHER): Payer: Medicare Other | Admitting: Family Medicine

## 2014-03-10 ENCOUNTER — Encounter: Payer: Self-pay | Admitting: Family Medicine

## 2014-03-10 VITALS — BP 138/74 | HR 72 | Temp 97.7°F | Wt 198.0 lb

## 2014-03-10 DIAGNOSIS — I951 Orthostatic hypotension: Secondary | ICD-10-CM

## 2014-03-10 DIAGNOSIS — H8110 Benign paroxysmal vertigo, unspecified ear: Secondary | ICD-10-CM

## 2014-03-10 DIAGNOSIS — IMO0002 Reserved for concepts with insufficient information to code with codable children: Secondary | ICD-10-CM

## 2014-03-10 MED ORDER — MECLIZINE HCL 12.5 MG PO TABS
12.5000 mg | ORAL_TABLET | Freq: Two times a day (BID) | ORAL | Status: DC | PRN
Start: 2014-03-10 — End: 2015-01-06

## 2014-03-10 NOTE — Progress Notes (Signed)
   Subjective:    Patient ID: Jim Clarke, male    DOB: [birth year], 73 y.o.   MRN: [id number]  HPI  Mr. AIKEN WITHEM is a very pleasant 73 y.o. yo male who has a PMH significant for HTN, type II diabetes and hyperlipidemia. He presents today for acute onset vertigo.  The patient was in his usual state of health until he got up from bench pressing yesterday afternoon and had an acute episode of dizziness. He also had two more episodes of dizziness, one late last night and another this morning after exiting the shower. The patient describes the sensation as one of not being able to get his balance. The symptoms last a few seconds to a minute.   The dizziness is made worse with moving his head side to side or looking up suddenly while standing. The dizziness resolves after a couple seconds to a minute. After the dizziness resolves, movement of the head causes no symptoms. The most recent episode of dizziness occurred this morning and he still having some residual difficulties.  He denies any sensation of spinning, loss of consciousness, history of heart disease or rapid heart rate. The patient notes some nausea and sweating with the last episode of dizziness this morning.   The patient has been diagnosed with vertigo in the past and states that this feels exactly like that and feels distinctly different from his episodes of hypoglycemia which he has also experienced. The patient also checked his blood sugar after the episode of dizziness this morning and it was 100.  [facility] Glyburide to 5mg  in the pm from 10mg , based on his most recent HgA1c of 5.8  Review of Systems is negative except per HPI.   Objective:   Physical Exam  Heart: RRR, No M/R/G Lungs: CTAB, No W /R Neurologic: Head tilt does not illicit dizziness.EOMI.Cerebellar: FTN wnl. Dizziness not elicited with moving her head in any direction.    Assessment & Plan:  Postural  hypotension  Positional vertigo - Plan: meclizine (ANTIVERT) 12.5 MG tablet  Given the patient's benign symptomology, quick onset and short duration with a lack of neurologic findings, this most likely represents benign postural hypotension and some positional vertigo. The patient was counciled on standing up more slowly, pausing a second or two before further movement and sitting on the side of the bed prior to standing up after laying down. Meclizine also prescribed for as needed dizziness relief.

## 2014-03-10 NOTE — Patient Instructions (Signed)
Go slow with position changes. When you get out of bed, set on the edge of the bed then stand and walk.

## 2014-05-21 ENCOUNTER — Telehealth: Payer: Self-pay

## 2014-05-21 NOTE — Telephone Encounter (Signed)
LEFT MESSAGE THAT HE WAY PAST DUE ON DIABETES CHECK TO PLEASE CALL AND MAKE APPOINTMENT UNLESS HE IS SEEING ENDOCRINOLOGY BUT WE DO NEED TO KNOW THIS

## 2014-06-01 ENCOUNTER — Telehealth: Payer: Self-pay | Admitting: Internal Medicine

## 2014-06-01 NOTE — Telephone Encounter (Signed)
[facility]

## 2014-08-18 LAB — HM DIABETES EYE EXAM

## 2014-09-03 ENCOUNTER — Encounter: Payer: Self-pay | Admitting: Family Medicine

## 2014-09-07 ENCOUNTER — Encounter: Payer: Self-pay | Admitting: Family Medicine

## 2014-09-29 ENCOUNTER — Other Ambulatory Visit: Payer: Self-pay

## 2014-09-29 ENCOUNTER — Telehealth: Payer: Self-pay | Admitting: Internal Medicine

## 2014-09-29 MED ORDER — GLUCOSE BLOOD VI STRP: 1.0000 | ORAL_STRIP | Freq: Two times a day (BID) | Status: DC

## 2014-09-29 NOTE — Telephone Encounter (Signed)
i have sent in test strips with DX code E11.9

## 2014-09-29 NOTE — Telephone Encounter (Signed)
Take care of this 

## 2014-09-29 NOTE — Telephone Encounter (Signed)
Pt called stating that he needs to one ultra test strips. He test twice a day. [facility] but didn't have a diagnose code on it so they wont fill it for him and the doctor is out this week from the office. Pt needs this refilled with icd-10 code on it. Send to [facility]. Pt had A1c checked in august at [place] and it was 5.8%

## 2014-11-13 HISTORY — PX: CATARACT EXTRACTION W/ INTRAOCULAR LENS  IMPLANT, BILATERAL: SHX1307

## 2014-11-19 ENCOUNTER — Other Ambulatory Visit (INDEPENDENT_AMBULATORY_CARE_PROVIDER_SITE_OTHER): Payer: Medicare HMO

## 2014-11-19 DIAGNOSIS — Z23 Encounter for immunization: Secondary | ICD-10-CM

## 2015-01-06 ENCOUNTER — Ambulatory Visit (INDEPENDENT_AMBULATORY_CARE_PROVIDER_SITE_OTHER): Payer: Medicare HMO | Admitting: Family Medicine

## 2015-01-06 ENCOUNTER — Encounter: Payer: Self-pay | Admitting: Family Medicine

## 2015-01-06 VITALS — BP 118/80 | HR 78 | Wt 204.0 lb

## 2015-01-06 DIAGNOSIS — M7022 Olecranon bursitis, left elbow: Secondary | ICD-10-CM

## 2015-01-06 NOTE — Progress Notes (Signed)
   Subjective:    Patient ID: Jim Clarke, male    DOB: 1941/09/02, 74 y.o.   MRN: [id number]  HPI  He complains of swelling in the left elbow area. He has been working out on [employer] and applying a lot of pressure to that area. No pain, tenderness.   Review of Systems     Objective:   Physical Exam  full motion of the left elbow. The  Olecranon bursa is larger but it is not hot red or tender.      Assessment & Plan:   Olecranon bursitis, left  I explained that the inflammation is probably from irritation applying direct pressure over that. Explained that there is no intervention necessary. He was comfortable with that.

## 2015-01-28 ENCOUNTER — Other Ambulatory Visit: Payer: Self-pay | Admitting: *Deleted

## 2015-01-28 ENCOUNTER — Encounter: Payer: Self-pay | Admitting: Family Medicine

## 2015-01-28 ENCOUNTER — Ambulatory Visit (INDEPENDENT_AMBULATORY_CARE_PROVIDER_SITE_OTHER): Payer: Medicare HMO | Admitting: Family Medicine

## 2015-01-28 VITALS — BP 140/80 | HR 64 | Ht 67.0 in | Wt 209.4 lb

## 2015-01-28 DIAGNOSIS — M7022 Olecranon bursitis, left elbow: Secondary | ICD-10-CM

## 2015-01-28 DIAGNOSIS — L255 Unspecified contact dermatitis due to plants, except food: Secondary | ICD-10-CM

## 2015-01-28 DIAGNOSIS — L237 Allergic contact dermatitis due to plants, except food: Secondary | ICD-10-CM

## 2015-01-28 MED ORDER — ATORVASTATIN CALCIUM 20 MG PO TABS: 20.0000 mg | ORAL_TABLET | Freq: Every day | ORAL | Status: DC

## 2015-01-28 MED ORDER — METHYLPREDNISOLONE ACETATE 40 MG/ML IJ SUSP
40.0000 mg | Freq: Once | INTRAMUSCULAR | Status: AC
  Administered 2015-01-28: 40 mg via INTRAMUSCULAR

## 2015-01-28 NOTE — Progress Notes (Signed)
Chief Complaint  Patient presents with  . Poison Oak    patient states that he has poison oak. Just had eye surgery last month and he is worried that it may spread to his eyes. Would like a shot today if possible.    He was helping his daughter in her yard 5 days ago.  He first noticed the rash 3 nights ago on both forearms, and has also noticed it on his face.  He is worried about it getting to his eyes, as he had cataract surgery 1 month ago. It has been spreading more on the arms and face (started on forehead, spread to nose and cheeks), but not spreading elsewhere on the body.  He used topical benadryl cream and calamine lotion.  It helps just a little.  Daughter also got rash, and went to doctor and was diagnosed with poison oak. He believes he saw poison oak leaves when clearing the yard.  Sugars have been well controlled with his current regimen for diabetes.  He recently saw Dr. Redmond School for his left elbow. He states there is still some swelling at the elbow.  Denies any pain.  PMH, PSH, SH reviewed.  Outpatient Encounter Prescriptions as of 01/28/2015  Medication Sig  . aspirin 81 MG tablet Take 81 mg by mouth daily.    Marland Kitchen atorvastatin (LIPITOR) 20 MG tablet Take 1 tablet (20 mg total) by mouth daily.  . Blood Glucose Monitoring Suppl (ONE TOUCH ULTRA 2) W/DEVICE KIT 1 each by Does not apply route daily. Pt testing twice daily  . Blood Pressure Monitoring (SPHYGMOMANOMETER) MISC 1 Device by Does not apply route every morning.  Marland Kitchen glipiZIDE (GLUCOTROL) 5 MG tablet Take 5 mg by mouth daily.   Marland Kitchen glucose blood (ONE TOUCH ULTRA TEST) test strip 1 each by Other route 2 (two) times daily. Use as instructed Patient is to test bid [id number]:E11.9  . glucose blood test strip Use as instructed pt is to test two times daily 250.00  . metFORMIN (GLUCOPHAGE) 1000 MG tablet Take 1 tablet (1,000 mg total) by mouth 2 (two) times daily with a meal.  . Multiple Vitamins-Minerals (MULTIVITAMIN WITH MINERALS)  tablet Take 1 tablet by mouth daily.    . [DISCONTINUED] atorvastatin (LIPITOR) 20 MG tablet Take 20 mg by mouth daily.   Allergies  Allergen Reactions  . Sulfa Antibiotics    ROS: no fevers, chills, URI symptoms, nausea, vomiting, diarrhea, joint pains (just knee pain from arthritis), chest pain, palpitations, shortness of breath. No headaches, dizziness, edema or other concerns, just the rash as noted in HPI.  PHYSICAL EXAM: BP 140/80 mmHg  Pulse 64  Ht $R'5\' 7"'fI$  (1.702 m)  Wt 209 lb 6.4 oz (94.983 kg)  BMI 32.79 kg/m2 Well developed, pleasant male in no distress Skin: curvilinear pattern of vesicles on both forearms, L>R.   Face: areas involved include nose, especially at R nares; some vesicles also noted on forehead and cheeks.  Eyes are clear, OP is clear. L elbow--slight inflammation of olecranon bursa--nontender, no erythema or warmth Neuro: alert and oriented. Cranial nerves intact. Normal strength, gait Psych: normal mood, affect, hygiene and grooming  ASSESSMENT/PLAN:  Plant allergic contact dermatitis - Plan: methylPREDNISolone acetate (DEPO-MEDROL) injection 40 mg  Olecranon bursitis, left - mild, without significant inflammation.  likely will further improve with steroids given for dermatitis. avoid direct pressure   Plant dermatitis (pt suspects poison oak). Pt prefers injection.  Given ongoing spread on face, steroids are indicated, rather than topical.  Given his diabetes, and fairly mild course so far, will use low dose ($RemoveB'40mg'EMLwhmop$  depo medrol).  Advised to contact us for additional oral steroids if needed.  Risks and side effects of steroids (including elevated sugars) were reviewed in detail.  Your sugars will go up from the steroids--this is expected, and should be temporary. As long as you don't feel bad, don't worry about it. The steroids might also help with the inflammation at your elbow (and other arthritis pain, temporarily). It may also cause you to have insomnia,  mood changes, and weight gain/hunger.  You may continue to use calamine lotion, or hydrocortisone cream as needed for itching. You can also use oral antihistamine such as claritin, zyrtec or benadryl

## 2015-01-28 NOTE — Patient Instructions (Addendum)
  Your sugars will go up from the steroids--this is expected, and should be temporary. As long as you don't feel bad, don't worry about it. The steroids might also help with the inflammation at your elbow (and other arthritis pain, temporarily). It may also cause you to have insomnia, mood changes, and weight gain/hunger.  You may continue to use calamine lotion, or hydrocortisone cream as needed for itching. You can also use oral antihistamine such as claritin, zyrtec or benadryl  Poison [facility] is an inflammation of the skin (contact dermatitis). It is caused by contact with the allergens on the leaves of the oak (toxicodendron) plants. Depending on your sensitivity, the rash may consist simply of redness and itching, or it may also progress to blisters which may break open (rupture). These must be well cared for to prevent secondary germ (bacterial) infection as these infections can lead to scarring. The eyes may also get puffy. The puffiness is worst in the morning and gets better as the day progresses. Healing is best accomplished by keeping any open areas dry, clean, covered with a bandage, and covered with an antibacterial ointment if needed. Without secondary infection, this dermatitis usually heals without scarring within 2 to 3 weeks without treatment. HOME CARE INSTRUCTIONS When you have been exposed to poison oak, it is very important to thoroughly wash with soap and water as soon as the exposure has been discovered. You have about one half hour to remove the plant resin before it will cause the rash. This cleaning will quickly destroy the oil or antigen on the skin (the antigen is what causes the rash). Wash aggressively under the fingernails as any plant resin still there will continue to spread the rash. Do not rub skin vigorously when washing affected area. Poison oak cannot spread if no oil from the plant remains on your body. Rash that has progressed to weeping sores (lesions) will  not spread the rash unless you have not washed thoroughly. It is also important to clean any clothes you have been wearing as they may carry active allergens which will spread the rash, even several days later. Avoidance of the plant in the future is the best measure. Poison oak plants can be recognized by the number of leaves. Generally, poison oak has three leaves with flowering branches on a single stem. Diphenhydramine may be purchased over the counter and used as needed for itching. Do not drive with this medication if it makes you drowsy. Ask your caregiver about medication for children. SEEK IMMEDIATE MEDICAL CARE IF:   Open areas of the rash develop.  You notice redness extending beyond the area of the rash.  There is a pus like discharge.  There is increased pain.  Other signs of infection develop (such as fever). Document Released: 05/06/2003 Document Revised: 01/22/2012 Document Reviewed: 09/15/2009 [facility] Patient Information 2015 [place], [place]. This information is not intended to replace advice given to you by your health care provider. Make sure you discuss any questions you have with your health care provider.

## 2015-02-01 ENCOUNTER — Encounter: Payer: Self-pay | Admitting: Family Medicine

## 2015-02-01 ENCOUNTER — Ambulatory Visit (INDEPENDENT_AMBULATORY_CARE_PROVIDER_SITE_OTHER): Payer: Medicare HMO | Admitting: Family Medicine

## 2015-02-01 VITALS — BP 130/78 | HR 72 | Temp 98.5°F | Ht 67.0 in | Wt 207.6 lb

## 2015-02-01 DIAGNOSIS — L255 Unspecified contact dermatitis due to plants, except food: Secondary | ICD-10-CM | POA: Diagnosis not present

## 2015-02-01 MED ORDER — PREDNISONE 20 MG PO TABS: ORAL_TABLET | ORAL | Status: DC

## 2015-02-01 MED ORDER — FLUOCINONIDE 0.05 % EX CREA: 1.0000 "application " | TOPICAL_CREAM | Freq: Two times a day (BID) | CUTANEOUS | Status: DC

## 2015-02-01 MED ORDER — MUPIROCIN CALCIUM 2 % EX CREA: 1.0000 "application " | TOPICAL_CREAM | Freq: Three times a day (TID) | CUTANEOUS | Status: DC

## 2015-02-01 NOTE — Progress Notes (Signed)
Chief Complaint  Patient presents with  . Poison Oak    shot that was given did not help at all-spreading. Shot did not make sugars go up. Also states that his daughter has this as well and she was given dos pak and it did not help her.    He was seen 3/17 with poison oak.  Areas of involvement were limited to the forearms and face.  He was given a low dose of steroid ($RemoveBef'40mg'WSGTHaRnSM$  depo medrol) due to his diabetes, limited involvement.  The face lesions are much better--no longer itchy, and less red, not spreading. However, the forearms continue to have very itchy rash, spreading further up the arm, and now has a new area behind both legs, worse on the left, and has two areas on his abdomen as well.  He notes some intermittent drainage/weeping from behind the knee.  He admits there was one night where it was very itchy, and he might have scratched at it.  He has been using alcohol after washing with soap and water, and using calamine lotion, which helps with the itching.  DM--Max sugar was 161 2 hrs after eating a fried meal.  Fasting high was 126.  PMH, PSH, SH reviewed. Current Outpatient Prescriptions on File Prior to Visit  Medication Sig Dispense Refill  . aspirin 81 MG tablet Take 81 mg by mouth daily.      Marland Kitchen atorvastatin (LIPITOR) 20 MG tablet Take 1 tablet (20 mg total) by mouth daily. 90 tablet 0  . Blood Glucose Monitoring Suppl (ONE TOUCH ULTRA 2) W/DEVICE KIT 1 each by Does not apply route daily. Pt testing twice daily    . Blood Pressure Monitoring (SPHYGMOMANOMETER) MISC 1 Device by Does not apply route every morning. 1 each 0  . glipiZIDE (GLUCOTROL) 5 MG tablet Take 5 mg by mouth daily.     Marland Kitchen glucose blood (ONE TOUCH ULTRA TEST) test strip 1 each by Other route 2 (two) times daily. Use as instructed Patient is to test bid DX:E11.9 300 each 1  . glucose blood test strip Use as instructed pt is to test two times daily 250.00 300 each prn  . metFORMIN (GLUCOPHAGE) 1000 MG tablet Take 1  tablet (1,000 mg total) by mouth 2 (two) times daily with a meal. 180 tablet 1  . Multiple Vitamins-Minerals (MULTIVITAMIN WITH MINERALS) tablet Take 1 tablet by mouth daily.       No current facility-administered medications on file prior to visit.   Allergies  Allergen Reactions  . Sulfa Antibiotics    ROS: no fevers, chills, headaches, dizziness, chest pain, URI symptoms, shortness of breath or other concerns, except as noted in HPI   PHYSICAL EXAM: BP 130/78 mmHg  Pulse 72  Temp(Src) 98.5 F (36.9 C) (Tympanic)  Ht $R'5\' 7"'bh$  (1.702 m)  Wt 207 lb 9.6 oz (94.167 kg)  BMI 32.51 kg/m2 Well developed, pleasant male, in no distress Skin:  Left popliteal fossa--large patch of erythema, excoriations, dry (with calamine lotion covering). +scabbing.  No streaks or significant soft tissue swelling. Forearms--mostly distally (near wrist) there is significant rash--erythema, papules.  Left mid- forearm there is a linear area of erythema that is raised.  No true vesicles are present currently. Face, just a small hint of redness on his cheek and forehead--significantly improved.  ASSESSMENT/PLAN:  Plant dermatitis - Plan: fluocinonide cream (LIDEX) 0.05 %, mupirocin cream (BACTROBAN) 2 %, predniSONE (DELTASONE) 20 MG tablet  Poison oak--with further spread. Face has improved.  Area  behind L knee almost looks eczematous. Concern for possible early infection due to scratching/excoriations on the back of his left knee.   bactroban to scabbed areas, especially behind the left knee. Use fluocinonide cream twice daily to affected areas of leg, abdomen and forearms. Do not use on face. If rash isn't healing, or if new areas keep popping up, then start the prednisone prescription.   Written prescription for steroid taper with $RemoveBe'20mg'WRqUDFZAG$  prednisone tablets (2/2/1.5/1.5/1/1/1/1/0.5/05./0.5/0.5)--to start in a few days if not improving with the topical strong steroid alone. Advised of risks/side effects, and  to expect high blood sugars.

## 2015-02-01 NOTE — Patient Instructions (Signed)
Use the bactroban to scabbed areas, especially behind the left knee. This is an antibiotic. Use fluocinonide cream twice daily to affected areas of leg, abdomen and forearms. Do not use on face. This is a steroid cream--use sparingly, wash your hands after applying.    If rash isn't healing, or if new areas keep popping up, then start the prednisone prescription. Take as directed on the second sheet given.

## 2015-08-23 ENCOUNTER — Other Ambulatory Visit: Payer: Self-pay

## 2015-08-23 ENCOUNTER — Telehealth: Payer: Self-pay | Admitting: Family Medicine

## 2015-08-23 MED ORDER — GLIPIZIDE 5 MG PO TABS: 5.0000 mg | ORAL_TABLET | Freq: Every day | ORAL | Status: DC

## 2015-08-23 MED ORDER — METFORMIN HCL 1000 MG PO TABS: 1000.0000 mg | ORAL_TABLET | Freq: Two times a day (BID) | ORAL | Status: DC

## 2015-08-23 MED ORDER — ATORVASTATIN CALCIUM 20 MG PO TABS: 20.0000 mg | ORAL_TABLET | Freq: Every day | ORAL | Status: DC

## 2015-08-23 NOTE — Telephone Encounter (Signed)
[facility] I explained we have no lab work on him since 2013  [facility]  For each of his meds from now on

## 2015-08-23 NOTE — Telephone Encounter (Signed)
Dr.Lalonde I have put those labs in the blue folder

## 2015-08-23 NOTE — Telephone Encounter (Signed)
Pt called requesting a refill for his RX metformin,atorvastatin,glipizide, [facility] [place] pt can be reached at [phone] [facility],

## 2015-08-23 NOTE — Telephone Encounter (Signed)
Pt dropped labs off, said also that  Needs diagnosis code when you put the refill in so the insurance will pay for the RX thanks so much, said if you need anything else to please give him a call

## 2015-08-29 NOTE — Telephone Encounter (Signed)
It looks like he will need a 90 day supply on these so call them in and give the pharmacy whatever diagnostic code they need

## 2015-08-31 DIAGNOSIS — C44519 Basal cell carcinoma of skin of other part of trunk: Secondary | ICD-10-CM | POA: Diagnosis not present

## 2015-08-31 DIAGNOSIS — Z85828 Personal history of other malignant neoplasm of skin: Secondary | ICD-10-CM | POA: Diagnosis not present

## 2015-08-31 DIAGNOSIS — D485 Neoplasm of uncertain behavior of skin: Secondary | ICD-10-CM | POA: Diagnosis not present

## 2015-08-31 DIAGNOSIS — L570 Actinic keratosis: Secondary | ICD-10-CM | POA: Diagnosis not present

## 2015-08-31 DIAGNOSIS — L821 Other seborrheic keratosis: Secondary | ICD-10-CM | POA: Diagnosis not present

## 2015-08-31 DIAGNOSIS — C4491 Basal cell carcinoma of skin, unspecified: Secondary | ICD-10-CM

## 2015-08-31 DIAGNOSIS — Z85820 Personal history of malignant melanoma of skin: Secondary | ICD-10-CM | POA: Diagnosis not present

## 2015-08-31 HISTORY — DX: Basal cell carcinoma of skin, unspecified: C44.91

## 2015-09-02 ENCOUNTER — Encounter: Payer: Self-pay | Admitting: Family Medicine

## 2015-09-03 DIAGNOSIS — E113291 Type 2 diabetes mellitus with mild nonproliferative diabetic retinopathy without macular edema, right eye: Secondary | ICD-10-CM | POA: Diagnosis not present

## 2015-09-03 DIAGNOSIS — E119 Type 2 diabetes mellitus without complications: Secondary | ICD-10-CM | POA: Diagnosis not present

## 2015-09-03 LAB — HM DIABETES EYE EXAM

## 2015-09-08 ENCOUNTER — Encounter: Payer: Self-pay | Admitting: Family Medicine

## 2015-09-09 ENCOUNTER — Encounter: Payer: Self-pay | Admitting: Family Medicine

## 2015-09-10 ENCOUNTER — Encounter: Payer: Self-pay | Admitting: Internal Medicine

## 2015-10-01 DIAGNOSIS — Z23 Encounter for immunization: Secondary | ICD-10-CM | POA: Diagnosis not present

## 2015-10-09 DIAGNOSIS — J209 Acute bronchitis, unspecified: Secondary | ICD-10-CM | POA: Diagnosis not present

## 2015-10-21 DIAGNOSIS — R69 Illness, unspecified: Secondary | ICD-10-CM | POA: Diagnosis not present

## 2015-11-25 ENCOUNTER — Other Ambulatory Visit: Payer: Medicare HMO

## 2015-11-25 DIAGNOSIS — R69 Illness, unspecified: Secondary | ICD-10-CM | POA: Diagnosis not present

## 2015-11-25 MED ORDER — ONETOUCH ULTRASOFT LANCETS MISC: Status: AC

## 2015-11-25 MED ORDER — GLUCOSE BLOOD VI STRP: 1.0000 | ORAL_STRIP | Freq: Two times a day (BID) | Status: AC

## 2015-11-25 MED ORDER — ONETOUCH ULTRA 2 W/DEVICE KIT: 1.0000 | PACK | Freq: Every day | Status: DC

## 2016-01-07 DIAGNOSIS — R69 Illness, unspecified: Secondary | ICD-10-CM | POA: Diagnosis not present

## 2016-02-02 ENCOUNTER — Telehealth: Payer: Self-pay | Admitting: Family Medicine

## 2016-02-02 NOTE — Telephone Encounter (Signed)
Called and spoke to pt. Informed that he needed a CPE. Last one was in 2012. Pt stated he was busy and would call me back.

## 2016-02-11 ENCOUNTER — Ambulatory Visit (INDEPENDENT_AMBULATORY_CARE_PROVIDER_SITE_OTHER): Payer: Medicare HMO | Admitting: Family Medicine

## 2016-02-11 ENCOUNTER — Encounter: Payer: Self-pay | Admitting: Family Medicine

## 2016-02-11 VITALS — BP 116/76 | HR 86 | Resp 14 | Ht 67.0 in | Wt 216.2 lb

## 2016-02-11 DIAGNOSIS — E1169 Type 2 diabetes mellitus with other specified complication: Secondary | ICD-10-CM

## 2016-02-11 DIAGNOSIS — E785 Hyperlipidemia, unspecified: Secondary | ICD-10-CM | POA: Diagnosis not present

## 2016-02-11 DIAGNOSIS — I152 Hypertension secondary to endocrine disorders: Secondary | ICD-10-CM

## 2016-02-11 DIAGNOSIS — E1159 Type 2 diabetes mellitus with other circulatory complications: Secondary | ICD-10-CM | POA: Diagnosis not present

## 2016-02-11 DIAGNOSIS — I10 Essential (primary) hypertension: Secondary | ICD-10-CM | POA: Diagnosis not present

## 2016-02-11 DIAGNOSIS — M1990 Unspecified osteoarthritis, unspecified site: Secondary | ICD-10-CM | POA: Diagnosis not present

## 2016-02-11 DIAGNOSIS — E118 Type 2 diabetes mellitus with unspecified complications: Secondary | ICD-10-CM

## 2016-02-11 NOTE — Progress Notes (Signed)
Jim Clarke is a 75 y.o. male who presents for annual wellness visit and follow-up on chronic medical conditions.  He has the following concerns:He does have underlying diabetes and is followed for that as well as his high blood pressure, lipids. His last hemoglobin A1c was 7.1.He has had recent eye exam and does check his feet regularly. He does not smoke. He has had bilateral cataract surgery. He also has remote history of smoking and has had a limited CT of his chest done. They also did an ultrasound for AAA and found nothing. He has had ABIs done as well. He does complain of left knee pain and recently was seen in a different clinic. They recommended injections, knee brace and routine follow-up on this. He is not happy with that. He does have underlying ED but presently is not treating this. His medications were reviewed.   Immunization History  Administered Date(s) Administered  . Influenza Whole 10/01/2007, 08/25/2009  . Influenza, High Dose Seasonal PF 11/19/2014  . Influenza,inj,Quad PF,36+ Mos 08/07/2013  . Pneumococcal Conjugate-13 05/17/2006  . Zoster 05/17/2006   Last colonoscopy: 2010 Last PSA: 2014 Dentist:11/2015 Ophtho:09/2015 Exercise:Not currently exercising Due to knee pain.  Other doctors caring for patient include: Dr Naaman Plummer at [facility] who is mainly taking care of all his medical needs.   Depression screen:  See questionnaire below.     Depression screen PHQ 2/9 02/11/2016  Decreased Interest 0  Down, Depressed, Hopeless 0  PHQ - 2 Score 0    Fall Screen: See Questionaire below.   Fall Risk  02/11/2016  Falls in the past year? No    ADL screen:  See questionnaire below.  Functional Status Survey: Is the patient deaf or have difficulty hearing?: No Does the patient have difficulty seeing, even when wearing glasses/contacts?: No Does the patient have difficulty concentrating, remembering, or making decisions?: No Does the patient have difficulty walking or climbing  stairs?: No Does the patient have difficulty dressing or bathing?: No Does the patient have difficulty doing errands alone such as visiting a doctor's office or shopping?: No   End of Life Discussion:  Patient does not have a living will and medical power of attorney. Information given.   Review of Systems Negative PHYSICAL EXAM:  BP 116/76 mmHg  Pulse 86  Resp 14  Ht 5\' 7"  (1.702 m)  Wt 216 lb 3.2 oz (98.068 kg)  BMI 33.85 kg/m2  General Appearance: Alert, cooperative, no distress, appears stated age Head: Normocephalic, without obvious abnormality, atraumatic Psych: Normal mood, affect, hygiene and grooming  ASSESSMENT/PLAN: Arthritis - Plan: DG Knee Complete 4 Views Left  Type 2 diabetes mellitus with complication, without long-term current use of insulin ([place])  Hypertension associated with diabetes ([place])  Hyperlipidemia associated with type 2 diabetes mellitus ([facility])  Hyperlipidemia LDL goal <70    Discussed PSA screening (risks/benefits), recommended at least 30 minutes of aerobic activity at least 5 days/week; proper sunscreen use reviewed; healthy diet and alcohol recommendations (less than or equal to 2 drinks/day) reviewed; regular seatbelt use; changing batteries in smoke detectors. Immunization recommendations discussed.  Colonoscopy recommendations reviewed.   Medicare Attestation I have personally reviewed: The patient's medical and social history Their use of alcohol, tobacco or illicit drugs Their current medications and supplements The patient's functional ability including ADLs,fall risks, home safety risks, cognitive, and hearing and visual impairment Diet and physical activities Evidence for depression or mood disorders Overall he is being cared for quite well at the [place]. I  will get follow-up x-ray on his knee. Discussed possible interventions including having a partial knee replacement based on his symptoms. The patient's weight, height, and BMI have  been recorded in the chart.  I have made referrals, counseling, and provided education to the patient based on review of the above and I have provided the patient with a written personalized care plan for preventive services.     [name], MD   02/11/2016

## 2016-02-11 NOTE — Patient Instructions (Signed)
  Jim Clarke , Thank you for taking time to come for your Medicare Wellness Visit. I appreciate your ongoing commitment to your health goals. Please review the following plan we discussed and let me know if I can assist you in the future.   These are the goals we discussed: X-ray on the end follow-up pending results of this. Also continue the excellent care through the [place].  This is a list of the screening recommended for you and due dates:  Health Maintenance  Topic Date Due  . Complete foot exam   04/04/1952  . Tetanus Vaccine  04/04/1961  . Pneumonia vaccines (2 of 2 - PPSV23) 05/18/2007  . Hemoglobin A1C  12/28/2011  . Urine Protein Check  06/26/2012  . Flu Shot  06/14/2015  . Eye exam for diabetics  09/02/2016  . Colon Cancer Screening  10/27/2018  . Shingles Vaccine  Completed

## 2016-04-12 DIAGNOSIS — R69 Illness, unspecified: Secondary | ICD-10-CM | POA: Diagnosis not present

## 2016-06-07 DIAGNOSIS — I10 Essential (primary) hypertension: Secondary | ICD-10-CM | POA: Diagnosis not present

## 2016-06-07 DIAGNOSIS — E118 Type 2 diabetes mellitus with unspecified complications: Secondary | ICD-10-CM | POA: Diagnosis not present

## 2016-06-07 DIAGNOSIS — E7800 Pure hypercholesterolemia, unspecified: Secondary | ICD-10-CM | POA: Diagnosis not present

## 2016-06-07 DIAGNOSIS — Z0000 Encounter for general adult medical examination without abnormal findings: Secondary | ICD-10-CM | POA: Diagnosis not present

## 2016-08-07 DIAGNOSIS — R69 Illness, unspecified: Secondary | ICD-10-CM | POA: Diagnosis not present

## 2016-08-30 DIAGNOSIS — Z85828 Personal history of other malignant neoplasm of skin: Secondary | ICD-10-CM | POA: Diagnosis not present

## 2016-09-04 DIAGNOSIS — E119 Type 2 diabetes mellitus without complications: Secondary | ICD-10-CM | POA: Diagnosis not present

## 2016-09-25 DIAGNOSIS — Z23 Encounter for immunization: Secondary | ICD-10-CM | POA: Diagnosis not present

## 2016-09-27 ENCOUNTER — Telehealth: Payer: Self-pay | Admitting: Family Medicine

## 2016-09-27 NOTE — Telephone Encounter (Signed)
Pt blood pressure cuff quit working and states that if you write a rx that his insurance will pay for it.  He will pick up in the morning.  Pt ph [phone]

## 2016-11-08 DIAGNOSIS — R69 Illness, unspecified: Secondary | ICD-10-CM | POA: Diagnosis not present

## 2016-12-28 ENCOUNTER — Telehealth: Payer: Self-pay | Admitting: Family Medicine

## 2016-12-28 NOTE — Telephone Encounter (Signed)
Called pt to set up his Medicare Well Visit. His last one was 02/11/2016. Pt states he will call back to make.

## 2017-02-01 ENCOUNTER — Ambulatory Visit (INDEPENDENT_AMBULATORY_CARE_PROVIDER_SITE_OTHER): Payer: Medicare HMO | Admitting: Family Medicine

## 2017-02-01 ENCOUNTER — Encounter: Payer: Self-pay | Admitting: Family Medicine

## 2017-02-01 ENCOUNTER — Ambulatory Visit
Admission: RE | Admit: 2017-02-01 | Discharge: 2017-02-01 | Disposition: A | Discharge status: Home / Self Care | Payer: Self-pay | Source: Ambulatory Visit | Attending: Family Medicine | Admitting: Family Medicine

## 2017-02-01 VITALS — BP 110/60 | HR 73 | Wt 213.0 lb

## 2017-02-01 DIAGNOSIS — M1990 Unspecified osteoarthritis, unspecified site: Secondary | ICD-10-CM

## 2017-02-01 DIAGNOSIS — R040 Epistaxis: Secondary | ICD-10-CM | POA: Diagnosis not present

## 2017-02-01 DIAGNOSIS — M25562 Pain in left knee: Secondary | ICD-10-CM | POA: Diagnosis not present

## 2017-02-01 DIAGNOSIS — G8929 Other chronic pain: Secondary | ICD-10-CM

## 2017-02-01 DIAGNOSIS — I7090 Unspecified atherosclerosis: Secondary | ICD-10-CM

## 2017-02-01 DIAGNOSIS — M1712 Unilateral primary osteoarthritis, left knee: Secondary | ICD-10-CM | POA: Diagnosis not present

## 2017-02-01 NOTE — Progress Notes (Signed)
   Subjective:    Patient ID: Jim Clarke, male    DOB: 05/12/1941, 76 y.o.   MRN: [id number]  HPI He is here for evaluation of 2 issues. He has noted since he had URI symptoms several months ago, that he is having occasional bleeding from the right nares especially when he gets up in the morning. No sneezing, itchy watery eyes, rhinorrhea at the present time. Also he complains of a several year history of left knee pain and notes that this is especially getting worse in the last several months. He was recently seen at [facility] area he was told that he needed a brace as well as a series of injections. He is here for further consult concerning that.   Review of Systems     Objective:   Physical Exam Alert and in no distress. Nasal mucosa is slightly erythematous on the right but no evidence of recent bleeding.M of his mouth was normal. Left knee exam does show an effusion. Negative anterior drawer. McMurray's testing negative. Medial and lateral collateral ligaments intact. Questionable lipping is noted. 3 does show degenerative changes as well as atherosclerosis.      Assessment & Plan:  Chronic pain of left knee  Epistaxis, recurrent  Atherosclerosis Recommend conservative care for the epistaxis and if recurs again, let me look. He is also to schedule a follow-up appointment for injection with steroids. I explained to the course of therapy for his knee in regard to steroid injections followed by further joint injections based on his response and potentially ending up needing joint replacement. Will also discuss the diagnosis of atherosclerosis with him.

## 2017-02-06 ENCOUNTER — Ambulatory Visit (INDEPENDENT_AMBULATORY_CARE_PROVIDER_SITE_OTHER): Payer: Medicare HMO | Admitting: Family Medicine

## 2017-02-06 DIAGNOSIS — M06062 Rheumatoid arthritis without rheumatoid factor, left knee: Secondary | ICD-10-CM

## 2017-02-06 DIAGNOSIS — E785 Hyperlipidemia, unspecified: Secondary | ICD-10-CM

## 2017-02-06 DIAGNOSIS — E1169 Type 2 diabetes mellitus with other specified complication: Secondary | ICD-10-CM

## 2017-02-06 DIAGNOSIS — M25562 Pain in left knee: Secondary | ICD-10-CM | POA: Diagnosis not present

## 2017-02-06 MED ORDER — LIDOCAINE HCL 2 % IJ SOLN
3.0000 mL | Freq: Once | INTRAMUSCULAR | Status: AC
  Administered 2017-02-06: 60 mg via INTRADERMAL

## 2017-02-06 MED ORDER — TRIAMCINOLONE ACETONIDE 40 MG/ML IJ SUSP
40.0000 mg | Freq: Once | INTRAMUSCULAR | Status: AC
  Administered 2017-02-06: 40 mg via INTRAMUSCULAR

## 2017-02-06 MED ORDER — ATORVASTATIN CALCIUM 20 MG PO TABS: 20.0000 mg | ORAL_TABLET | Freq: Every day | ORAL | 2 refills | Status: AC

## 2017-02-06 NOTE — Progress Notes (Signed)
   Subjective:    Patient ID: Jim Clarke, male    DOB: 08-22-1941, 76 y.o.   MRN: [id number]  HPI He is here for follow-up on recent x-rays did show evidence of degenerative joint disease. He does have medial compartment narrowing and does have significant discomfort with this. He would like to have an injection. So goes to the [place] and would like a refill on his atorvastatin. His last blood work in November was apparently quite good.  Review of Systems     Objective:   Physical Exam Alert and in no distress otherwise not examined.       Assessment & Plan:  Hyperlipidemia associated with type 2 diabetes mellitus ([place]) - Plan: atorvastatin (LIPITOR) 20 MG tablet  Left knee pain, unspecified chronicity - Plan: lidocaine (XYLOCAINE) 2 % (with pres) injection 60 mg, triamcinolone acetonide (KENALOG-40) injection 40 mg  Rheumatoid arthritis involving left knee with negative rheumatoid factor (HCC) The left knee was prepped with Betadine. The joint was identified. 40 mg of Kenalog and 3 mL of Xylocaine was injected into the joint space minimal discomfort. He did note an improvement in his symptoms within the next several minutes. He will let me know how long this lasts. Discussed the fact that the longer lasts, the best would be for him. With continued difficulty I will refer to orthopedics for further injection of other materials.

## 2017-02-07 DIAGNOSIS — R69 Illness, unspecified: Secondary | ICD-10-CM | POA: Diagnosis not present

## 2017-02-20 ENCOUNTER — Ambulatory Visit: Payer: Self-pay | Admitting: Family Medicine

## 2017-03-08 ENCOUNTER — Telehealth: Payer: Self-pay | Admitting: Family Medicine

## 2017-03-08 NOTE — Telephone Encounter (Signed)
Left  Message with his spouse. Pt needs a medicare well visit. Last one was 02/11/2016.

## 2017-04-20 ENCOUNTER — Ambulatory Visit (INDEPENDENT_AMBULATORY_CARE_PROVIDER_SITE_OTHER): Payer: Medicare HMO | Admitting: Family Medicine

## 2017-04-20 ENCOUNTER — Encounter: Payer: Self-pay | Admitting: Family Medicine

## 2017-04-20 VITALS — BP 130/80 | Wt 210.0 lb

## 2017-04-20 DIAGNOSIS — M25462 Effusion, left knee: Secondary | ICD-10-CM

## 2017-04-20 DIAGNOSIS — M1990 Unspecified osteoarthritis, unspecified site: Secondary | ICD-10-CM | POA: Diagnosis not present

## 2017-04-20 NOTE — Progress Notes (Signed)
   Subjective:    Patient ID: Jim Clarke, male    DOB: October 19, 1941, 76 y.o.   MRN: [id number]  HPI He is here for evaluation of left knee pain and swelling. He states that this occurred after he did a lot of morning of a yard. Prior to this his knee was doing quite nicely and since mowing the yard, he did note swelling and discomfort the next day. Approximately 2 months ago he did get a knee injection which did help until this last episode.   Review of Systems     Objective:   Physical Exam Alert and in no distress. Left knee exam does show an effusion. McMurray's testing was uncomfortable medially. Negative anterior drawer.       Assessment & Plan:  Arthritis  Effusion of left knee I explained that this is essentially an overuse injury and recommended conservative care. Explained that I did not really want to give him a shot at the present time but would do it down the road if continued difficulty. Also discussed the fact that eventually he will need to be seen by orthopedics and discuss partial versus total knee replacement.

## 2017-04-20 NOTE — Patient Instructions (Signed)
Take 2 Aleve twice a day for the next 10 days. You can also take Tylenol with it for pain relief. It hurts don't do it

## 2017-06-26 DIAGNOSIS — H43813 Vitreous degeneration, bilateral: Secondary | ICD-10-CM | POA: Diagnosis not present

## 2017-06-26 DIAGNOSIS — E119 Type 2 diabetes mellitus without complications: Secondary | ICD-10-CM | POA: Diagnosis not present

## 2017-06-26 DIAGNOSIS — D3131 Benign neoplasm of right choroid: Secondary | ICD-10-CM | POA: Diagnosis not present

## 2017-07-11 DIAGNOSIS — R69 Illness, unspecified: Secondary | ICD-10-CM | POA: Diagnosis not present

## 2017-08-27 DIAGNOSIS — L720 Epidermal cyst: Secondary | ICD-10-CM | POA: Diagnosis not present

## 2017-08-27 DIAGNOSIS — Z85820 Personal history of malignant melanoma of skin: Secondary | ICD-10-CM | POA: Diagnosis not present

## 2017-08-27 DIAGNOSIS — L570 Actinic keratosis: Secondary | ICD-10-CM | POA: Diagnosis not present

## 2017-08-27 DIAGNOSIS — Z85828 Personal history of other malignant neoplasm of skin: Secondary | ICD-10-CM | POA: Diagnosis not present

## 2017-08-27 DIAGNOSIS — L821 Other seborrheic keratosis: Secondary | ICD-10-CM | POA: Diagnosis not present

## 2017-08-27 DIAGNOSIS — D485 Neoplasm of uncertain behavior of skin: Secondary | ICD-10-CM | POA: Diagnosis not present

## 2017-08-27 DIAGNOSIS — D225 Melanocytic nevi of trunk: Secondary | ICD-10-CM | POA: Diagnosis not present

## 2017-10-09 ENCOUNTER — Encounter: Payer: Self-pay | Admitting: Family Medicine

## 2017-10-09 ENCOUNTER — Ambulatory Visit: Payer: Medicare HMO | Admitting: Family Medicine

## 2017-10-09 VITALS — BP 112/68 | HR 92 | Temp 98.2°F | Wt 202.6 lb

## 2017-10-09 DIAGNOSIS — J209 Acute bronchitis, unspecified: Secondary | ICD-10-CM

## 2017-10-09 MED ORDER — AMOXICILLIN 875 MG PO TABS: 875.0000 mg | ORAL_TABLET | Freq: Two times a day (BID) | ORAL | 0 refills | Status: DC

## 2017-10-09 NOTE — Patient Instructions (Signed)
She take all the antibiotic and if not totally back to normal when you finish call me

## 2017-10-09 NOTE — Progress Notes (Signed)
   Subjective:    Patient ID: Jim Clarke, male    DOB: 04-21-1941, 76 y.o.   MRN: [id number]  HPI He complains of a 12-day history of chest congestion followed by cough has now become productive but no shortness of breath or chest pain.  No fever, chills, sore throat or earache.  He does not smoke.  He does have underlying diabetes handled at the [place].  His last A1c was 7.   Review of Systems     Objective:   Physical Exam Alert and in no distress. Tympanic membranes and canals are normal. Pharyngeal area is normal. Neck is supple without adenopathy or thyromegaly. Cardiac exam shows a regular sinus rhythm without murmurs or gallops. Lungs are clear to auscultation.        Assessment & Plan:  Acute bronchitis, unspecified organism - Plan: amoxicillin (AMOXIL) 875 MG tablet  I encouraged him to call me if not entirely better when he finishes the antibiotic. At the end of the encounter he mentions difficulty with knee pain.  He has had previous injections.  I asked him to return here if his symptoms worsen for possible reinjection.

## 2017-10-10 ENCOUNTER — Telehealth: Payer: Self-pay | Admitting: Family Medicine

## 2017-10-10 MED ORDER — HYDROCOD POLST-CPM POLST ER 10-8 MG/5ML PO SUER: 5.0000 mL | Freq: Two times a day (BID) | ORAL | 0 refills | Status: DC | PRN

## 2017-10-10 NOTE — Telephone Encounter (Signed)
[facility] pt's wife called and stated that cough medicine that was called in for him is not covered by his insurance and is too expensive. She would like something else call in to a DIFFERENT PHARMACY. Please send to [facility] [address]. She can be reached at [phone].

## 2017-10-10 NOTE — Telephone Encounter (Signed)
Let him know that I called the medication in. 

## 2017-10-10 NOTE — Telephone Encounter (Signed)
Medicare does not pay for any cough medicines, I called [facility] & cash price is $8.26.  Wife informed

## 2017-10-10 NOTE — Telephone Encounter (Signed)
Pt coughed all night and the coughing kept him awake. Requesting a script for cough med that he can take given the fact that he is a diabetic.

## 2017-10-10 NOTE — Telephone Encounter (Signed)
Call in Phenergan with codeine. 1 teaspoon every 4 hours. 4 ounces

## 2017-10-10 NOTE — Telephone Encounter (Signed)
Pt aware rx called in.

## 2017-10-11 MED ORDER — PROMETHAZINE-CODEINE 6.25-10 MG/5ML PO SYRP: 5.0000 mL | ORAL_SOLUTION | Freq: Four times a day (QID) | ORAL | 0 refills | Status: DC | PRN

## 2017-10-28 DIAGNOSIS — R69 Illness, unspecified: Secondary | ICD-10-CM | POA: Diagnosis not present

## 2018-01-02 DIAGNOSIS — R69 Illness, unspecified: Secondary | ICD-10-CM | POA: Diagnosis not present

## 2018-02-25 ENCOUNTER — Ambulatory Visit (INDEPENDENT_AMBULATORY_CARE_PROVIDER_SITE_OTHER): Payer: Medicare HMO | Admitting: Medical

## 2018-02-25 ENCOUNTER — Encounter: Payer: Self-pay | Admitting: Medical

## 2018-02-25 ENCOUNTER — Ambulatory Visit
Admission: RE | Admit: 2018-02-25 | Discharge: 2018-02-25 | Disposition: A | Discharge status: Home / Self Care | Payer: Medicare HMO | Source: Ambulatory Visit | Attending: Medical | Admitting: Medical

## 2018-02-25 VITALS — BP 118/62 | HR 105 | Temp 98.2°F | Ht 66.5 in | Wt 198.8 lb

## 2018-02-25 DIAGNOSIS — R509 Fever, unspecified: Secondary | ICD-10-CM

## 2018-02-25 DIAGNOSIS — R062 Wheezing: Secondary | ICD-10-CM

## 2018-02-25 DIAGNOSIS — Z87891 Personal history of nicotine dependence: Secondary | ICD-10-CM | POA: Diagnosis not present

## 2018-02-25 DIAGNOSIS — R059 Cough, unspecified: Secondary | ICD-10-CM

## 2018-02-25 DIAGNOSIS — R05 Cough: Secondary | ICD-10-CM

## 2018-02-25 LAB — BASIC METABOLIC PANEL
BUN/Creatinine Ratio: 13 (ref 10–24)
BUN: 26 mg/dL (ref 8–27)
CALCIUM: 8.8 mg/dL (ref 8.6–10.2)
CHLORIDE: 92 mmol/L — AB (ref 96–106)
CO2: 22 mmol/L (ref 20–29)
Creatinine, Ser: 2.07 mg/dL — ABNORMAL HIGH (ref 0.76–1.27)
GFR calc non Af Amer: 30 mL/min/{1.73_m2} — ABNORMAL LOW (ref >59)
GFR, EST AFRICAN AMERICAN: 35 mL/min/{1.73_m2} — AB (ref >59)
Glucose: 297 mg/dL — ABNORMAL HIGH (ref 65–99)
Potassium: 4.2 mmol/L (ref 3.5–5.2)
Sodium: 133 mmol/L — ABNORMAL LOW (ref 134–144)

## 2018-02-25 LAB — CBC WITH DIFFERENTIAL/PLATELET
BASOS: 0 %
Basophils Absolute: 0 10*3/uL (ref 0.0–0.2)
EOS (ABSOLUTE): 0 10*3/uL (ref 0.0–0.4)
EOS: 0 %
HEMATOCRIT: 43.3 % (ref 37.5–51.0)
Hemoglobin: 14.5 g/dL (ref 13.0–17.7)
IMMATURE GRANS (ABS): 0 10*3/uL (ref 0.0–0.1)
IMMATURE GRANULOCYTES: 0 %
LYMPHS: 7 %
Lymphocytes Absolute: 0.8 10*3/uL (ref 0.7–3.1)
MCH: 33 pg (ref 26.6–33.0)
MCHC: 33.5 g/dL (ref 31.5–35.7)
MCV: 99 fL — AB (ref 79–97)
Monocytes Absolute: 1.3 10*3/uL — ABNORMAL HIGH (ref 0.1–0.9)
Monocytes: 12 %
NEUTROS PCT: 81 %
Neutrophils Absolute: 9.2 10*3/uL — ABNORMAL HIGH (ref 1.4–7.0)
PLATELETS: 157 10*3/uL (ref 150–379)
RBC: 4.39 x10E6/uL (ref 4.14–5.80)
RDW: 13 % (ref 12.3–15.4)
WBC: 11.4 10*3/uL — ABNORMAL HIGH (ref 3.4–10.8)

## 2018-02-25 MED ORDER — AZITHROMYCIN 250 MG PO TABS: ORAL_TABLET | ORAL | 0 refills | Status: DC

## 2018-02-25 MED ORDER — PROMETHAZINE-CODEINE 6.25-10 MG/5ML PO SYRP: 5.0000 mL | ORAL_SOLUTION | Freq: Four times a day (QID) | ORAL | 0 refills | Status: DC | PRN

## 2018-02-25 MED ORDER — ALBUTEROL SULFATE HFA 108 (90 BASE) MCG/ACT IN AERS: 2.0000 | INHALATION_SPRAY | Freq: Four times a day (QID) | RESPIRATORY_TRACT | 0 refills | Status: DC | PRN

## 2018-02-25 NOTE — Patient Instructions (Signed)
Recommendations:  Begin Albuterol inhaler, 2 puffs every 4 -6 hours for wheezing, shortness of breath, or cough spells  You can use the Promethazine DM cough syrup.   I sent this to the pharmacy  Begin Zpak antibiotic  Go for chest xray today  We will also check your labs for blood counts and lytes  We will call with lab results tomorrow, and will hopefully get chest xray results today  Drink plenty of water throughout the day  If not much improved in the next 2-3 days, call back

## 2018-02-25 NOTE — Progress Notes (Signed)
Subjective:  Jim Clarke is a 77 y.o. male who presents for  Chief Complaint  Patient presents with  . Acute Visit    had to go to ER with daughter, coughing, congestion, fever, sneezing   Symptoms include 1 week  hx/o cough, congestion.   Was around sick people in the ED a week ago.  He had taken daughter to the ED.   Head stopped up, yellow thick nasal discharge, cough, feels wheezy and SOB, has had mild fever 101 as well.  Has sore throat.    No ear pain.  Sweats, but no body aches .  Has had some chills.   Using tylenol and some cough syrup.   Patient is not a smoker.  No hx/o asthma, no prior inhaler use.  Was outside mowing last week in the pollen.  Former smoker, but quit 2013.  He is concerned about the fact he has had a few different similar episodes of cough and congestion in recent months.  No other aggravating or relieving factors.  No other complaint.    Past Medical History:  Diagnosis Date  . Arthritis   . Calcium oxalate renal stones   . Diabetes mellitus   . Dyslipidemia   . ED (erectile dysfunction)   . Former smoker    quit years ago as of 2013  . GERD (gastroesophageal reflux disease)   . Hypertension   . Superficial basal cell carcinoma 08/31/15    Current Outpatient Medications on File Prior to Visit  Medication Sig Dispense Refill  . aspirin 81 MG tablet Take 81 mg by mouth daily.      Marland Kitchen atorvastatin (LIPITOR) 20 MG tablet Take 1 tablet (20 mg total) by mouth daily. 90 tablet 2  . Blood Glucose Monitoring Suppl (ONE TOUCH ULTRA 2) w/Device KIT 1 each by Does not apply route daily. Pt testing twice daily 1 each 0  . Blood Pressure Monitoring (SPHYGMOMANOMETER) MISC 1 Device by Does not apply route every morning. 1 each 0  . glipiZIDE (GLUCOTROL) 5 MG tablet Take 1 tablet (5 mg total) by mouth daily. 90 tablet 0  . glucose blood (ONE TOUCH ULTRA TEST) test strip 1 each by Other route 2 (two) times daily. Use as instructed Patient is to test bid [id number] 300 each  1  . glucose blood test strip Use as instructed pt is to test two times daily 250.00 300 each prn  . Lancets (ONETOUCH ULTRASOFT) lancets Use as instructed 100 each 12  . lisinopril (PRINIVIL,ZESTRIL) 5 MG tablet     . magnesium 30 MG tablet Take 30 mg by mouth 2 (two) times daily.    . metFORMIN (GLUCOPHAGE) 1000 MG tablet Take 1 tablet (1,000 mg total) by mouth 2 (two) times daily with a meal. 180 tablet 0  . Multiple Vitamins-Minerals (MULTIVITAMIN WITH MINERALS) tablet Take 1 tablet by mouth daily.       No current facility-administered medications on file prior to visit.     ROS as in subjective   Objective: BP 118/62 (BP Location: Right Arm, Patient Position: Sitting, Cuff Size: Normal)   Pulse (!) 105   Temp 98.2 F (36.8 C) (Oral)   Ht 5' 6.5" (1.689 m)   Wt 198 lb 12.8 oz (90.2 kg)   SpO2 93%   BMI 31.61 kg/m   General appearance: Alert, well developed, well nourished, no distress  Skin: warm, no rash                           Head: mild sinus tenderness,                            Eyes: conjunctiva pink, corneas clear                            Ears: flat left tympanic membrane, flat right tympanic membrane, external ear canals normal                          Nose: septum midline, turbinates swollen, with erythema and clear discharge             Mouth/throat: MMM, tongue normal, mild pharyngeal erythema                           Neck: supple, no adenopathy, no thyromegaly, non tender                         Lungs: decreased breath sounds, mild exp wheezes, no rales, few rhonchi        Assessment  Encounter Diagnoses  Name Primary?  . Cough Yes  . Wheezing   . Fever and chills   . Former smoker       Plan: Discussed symptoms and exam findings.   Discussed usual time frame to see improvement. Discussed possible complications or symptoms that would prompt call back or recheck within the next few days.     Medications prescribed:   Begin Promethazine DM for cough suppression if OTC cough medication isn't helping.   Caution advised as this may cause drowsiness.  Complete the course of Zpak antibiotic prescribed today.    Go for CXR, labs today.  Specific home care recommendations discussed:  Pain/fever relief: You may use over-the-counter Tylenol for pain or fever  Drink extra fluids. Fluids help thin the mucus so your sinuses can drain more easily.   Begin Albuterol inaler as discussed  Patient was advised to call or return if worse or not improving in the next few days.    Patient voiced understanding of diagnosis, recommendations, and treatment plan.  Sora was seen today for acute visit.  Diagnoses and all orders for this visit:  Cough -     Pulse oximetry (single); Future -     DG Chest 2 View; Future -     CBC with Differential/Platelet -     Basic metabolic panel  Wheezing -     Pulse oximetry (single); Future -     DG Chest 2 View; Future -     CBC with Differential/Platelet -     Basic metabolic panel  Fever and chills -     DG Chest 2 View; Future -     CBC with Differential/Platelet -     Basic metabolic panel  Former smoker -     DG Chest 2 View; Future -     CBC with Differential/Platelet -     Basic metabolic panel  Other orders -     promethazine-codeine (PHENERGAN WITH CODEINE) 6.25-10 MG/5ML syrup; Take 5 mLs by mouth every 6 (six) hours as needed for cough. -     azithromycin (ZITHROMAX)  250 MG tablet; 2 tablets day 1, then 1 tablet days 2-4

## 2018-02-25 NOTE — Addendum Note (Signed)
Addended by: Carlena Hurl on: 02/25/2018 10:18 AM   Modules accepted: Orders

## 2018-02-26 ENCOUNTER — Other Ambulatory Visit: Payer: Self-pay | Admitting: Medical

## 2018-03-04 ENCOUNTER — Encounter: Payer: Self-pay | Admitting: Family Medicine

## 2018-03-04 ENCOUNTER — Ambulatory Visit (INDEPENDENT_AMBULATORY_CARE_PROVIDER_SITE_OTHER): Payer: Medicare HMO | Admitting: Family Medicine

## 2018-03-04 ENCOUNTER — Telehealth: Payer: Self-pay

## 2018-03-04 VITALS — BP 140/62 | HR 60 | Temp 97.4°F | Ht 67.0 in | Wt 196.2 lb

## 2018-03-04 DIAGNOSIS — E118 Type 2 diabetes mellitus with unspecified complications: Secondary | ICD-10-CM

## 2018-03-04 DIAGNOSIS — J209 Acute bronchitis, unspecified: Secondary | ICD-10-CM

## 2018-03-04 LAB — POCT GLYCOSYLATED HEMOGLOBIN (HGB A1C): HEMOGLOBIN A1C: 7.8

## 2018-03-04 LAB — GLUCOSE, POCT (MANUAL RESULT ENTRY): POC GLUCOSE: 201 mg/dL — AB (ref 70–99)

## 2018-03-04 MED ORDER — PROMETHAZINE-CODEINE 6.25-10 MG/5ML PO SYRP: 5.0000 mL | ORAL_SOLUTION | Freq: Four times a day (QID) | ORAL | 0 refills | Status: DC | PRN

## 2018-03-04 MED ORDER — AMOXICILLIN 875 MG PO TABS: 875.0000 mg | ORAL_TABLET | Freq: Two times a day (BID) | ORAL | 0 refills | Status: DC

## 2018-03-04 NOTE — Progress Notes (Signed)
   Subjective:    Patient ID: Jim Clarke, male    DOB: 1941/05/22, 77 y.o.   MRN: [id number]  HPI He was seen on April 15 and treated with a Z-Pak.  He states that he is roughly 50% better but still having difficulty with a cough mainly in the evening.  He also states that his blood sugar earlier today was quite elevated.  He is concerned over that.  He usually gets his care through the [place] and states his last hemoglobin A1c was around 7.5.   Review of Systems     Objective:   Physical Exam Alert and in no distress. Tympanic membranes and canals are normal. Pharyngeal area is normal. Neck is supple without adenopathy or thyromegaly. Cardiac exam shows a regular sinus rhythm without murmurs or gallops. Lungs are clear to auscultation. A1c is 7.8.  Blood sugars 201.       Assessment & Plan:  Acute bronchitis, unspecified organism - Plan: amoxicillin (AMOXIL) 875 MG tablet, DISCONTINUED: promethazine-codeine (PHENERGAN WITH CODEINE) 6.25-10 MG/5ML syrup  Type 2 diabetes mellitus with complication, without long-term current use of insulin (HCC) - Plan: POCT glycosylated hemoglobin (Hb A1C), POCT glucose (manual entry) I reassured him that his diabetes is doing fairly well.  I will switch him to Amoxil which he states works much better than azithromycin.

## 2018-03-04 NOTE — Telephone Encounter (Signed)
Patients wife called stating [facility] [address] because she cane get it for $8 there. Please advise. Pharmacy has been updated to where medication should go.

## 2018-03-07 ENCOUNTER — Ambulatory Visit: Payer: Medicare HMO | Admitting: Family Medicine

## 2018-03-19 ENCOUNTER — Telehealth: Payer: Self-pay | Admitting: Family Medicine

## 2018-03-19 NOTE — Telephone Encounter (Signed)
Left message for pt to call. Needs a medicare well visit. Please advise Juliann Pulse if appt made.

## 2018-04-03 DIAGNOSIS — R69 Illness, unspecified: Secondary | ICD-10-CM | POA: Diagnosis not present

## 2018-04-18 ENCOUNTER — Telehealth: Payer: Self-pay | Admitting: Family Medicine

## 2018-04-18 NOTE — Telephone Encounter (Signed)
Left message for pt to call. Needs a medicare well visit.

## 2018-05-27 ENCOUNTER — Telehealth: Payer: Self-pay | Admitting: Family Medicine

## 2018-05-27 NOTE — Telephone Encounter (Signed)
Called pt and offered an appt for annual wellness visit. Pt declined to schedule. States he will call back.

## 2018-06-26 DIAGNOSIS — R69 Illness, unspecified: Secondary | ICD-10-CM | POA: Diagnosis not present

## 2018-06-27 DIAGNOSIS — E119 Type 2 diabetes mellitus without complications: Secondary | ICD-10-CM | POA: Diagnosis not present

## 2018-06-27 LAB — HM DIABETES EYE EXAM

## 2018-07-25 ENCOUNTER — Ambulatory Visit (INDEPENDENT_AMBULATORY_CARE_PROVIDER_SITE_OTHER): Payer: Medicare HMO | Admitting: Family Medicine

## 2018-07-25 ENCOUNTER — Encounter: Payer: Self-pay | Admitting: Family Medicine

## 2018-07-25 DIAGNOSIS — J209 Acute bronchitis, unspecified: Secondary | ICD-10-CM | POA: Diagnosis not present

## 2018-07-25 MED ORDER — PROMETHAZINE-CODEINE 6.25-10 MG/5ML PO SYRP: 5.0000 mL | ORAL_SOLUTION | Freq: Four times a day (QID) | ORAL | 0 refills | Status: DC | PRN

## 2018-07-25 MED ORDER — AMOXICILLIN-POT CLAVULANATE 875-125 MG PO TABS: 1.0000 | ORAL_TABLET | Freq: Two times a day (BID) | ORAL | 0 refills | Status: DC

## 2018-07-25 NOTE — Progress Notes (Signed)
   Subjective:    Patient ID: Jim Clarke, male    DOB: Aug 26, 1941, 77 y.o.   MRN: [id number]  HPI [place] of a 6-week history started with URI symptoms of runny nose, slight sore throat and sneezing.  He then developed chest congestion, cough that is intermittently productive and PND but no fever, chills and now no sore throat earache.  He does not smoke and has no underlying allergies   Review of Systems     Objective:   Physical Exam Alert and in no distress. Tympanic membranes and canals are normal. Pharyngeal area is normal. Neck is supple without adenopathy or thyromegaly. Cardiac exam shows a regular sinus rhythm without murmurs or gallops. Lungs show scattered rhonchi he will call if not entirely better when he finishes the antibiotic.      Assessment & Plan:  Acute bronchitis, unspecified organism - Plan: amoxicillin-clavulanate (AUGMENTIN) 875-125 MG tablet, promethazine-codeine (PHENERGAN WITH CODEINE) 6.25-10 MG/5ML syrup He will call if not entirely better when he finishes the antibiotic.

## 2018-08-05 ENCOUNTER — Telehealth: Payer: Self-pay | Admitting: Family Medicine

## 2018-08-05 MED ORDER — LEVOFLOXACIN 500 MG PO TABS: 500.0000 mg | ORAL_TABLET | Freq: Every day | ORAL | 0 refills | Status: DC

## 2018-08-05 NOTE — Telephone Encounter (Signed)
Called pt and advised. [place]

## 2018-08-05 NOTE — Telephone Encounter (Signed)
Pt says he has taken the antibiotic that he was given and now he is only about 40% better

## 2018-08-05 NOTE — Telephone Encounter (Signed)
Let him know that I called a different medication in and if this does not take care of it, he needs to set up another appointment

## 2018-08-14 DIAGNOSIS — R69 Illness, unspecified: Secondary | ICD-10-CM | POA: Diagnosis not present

## 2018-08-15 DIAGNOSIS — K648 Other hemorrhoids: Secondary | ICD-10-CM | POA: Diagnosis not present

## 2018-08-15 DIAGNOSIS — Z800 Family history of malignant neoplasm of digestive organs: Secondary | ICD-10-CM | POA: Diagnosis not present

## 2018-08-15 DIAGNOSIS — K635 Polyp of colon: Secondary | ICD-10-CM | POA: Diagnosis not present

## 2018-08-15 DIAGNOSIS — K5730 Diverticulosis of large intestine without perforation or abscess without bleeding: Secondary | ICD-10-CM | POA: Diagnosis not present

## 2018-08-15 DIAGNOSIS — Z1211 Encounter for screening for malignant neoplasm of colon: Secondary | ICD-10-CM | POA: Diagnosis not present

## 2018-08-15 LAB — HM COLONOSCOPY

## 2018-08-16 ENCOUNTER — Ambulatory Visit (INDEPENDENT_AMBULATORY_CARE_PROVIDER_SITE_OTHER): Payer: Medicare HMO | Admitting: Family Medicine

## 2018-08-16 ENCOUNTER — Encounter: Payer: Self-pay | Admitting: Family Medicine

## 2018-08-16 ENCOUNTER — Ambulatory Visit
Admission: RE | Admit: 2018-08-16 | Discharge: 2018-08-16 | Disposition: A | Discharge status: Home / Self Care | Payer: Medicare HMO | Source: Ambulatory Visit | Attending: Family Medicine | Admitting: Family Medicine

## 2018-08-16 DIAGNOSIS — J209 Acute bronchitis, unspecified: Secondary | ICD-10-CM | POA: Diagnosis not present

## 2018-08-16 DIAGNOSIS — R05 Cough: Secondary | ICD-10-CM | POA: Diagnosis not present

## 2018-08-16 MED ORDER — AMOXICILLIN-POT CLAVULANATE 875-125 MG PO TABS: 1.0000 | ORAL_TABLET | Freq: Two times a day (BID) | ORAL | 0 refills | Status: DC

## 2018-08-16 MED ORDER — ALBUTEROL SULFATE HFA 108 (90 BASE) MCG/ACT IN AERS: 2.0000 | INHALATION_SPRAY | Freq: Four times a day (QID) | RESPIRATORY_TRACT | 0 refills | Status: DC | PRN

## 2018-08-16 NOTE — Progress Notes (Signed)
   Subjective:    Patient ID: Jim Clarke, male    DOB: 1941/04/03, 77 y.o.   MRN: [id number]  HPI He is here for consult concerning continued difficulty with cough and congestion.  In mid April he was given a Z-Pak which helped only about 50%.  He was then switched to amoxicillin but still had difficulty.  September 12 he was switched to Augmentin however instead of taking it twice per day he was only taking it once per day but does state that it has helped with the cough and chest congestion but he is only roughly 60 to 65% better. No fever, chills, sore throat, earache. Review of Systems     Objective:   Physical Exam Alert and in no distress. Tympanic membranes and canals are normal. Pharyngeal area is normal. Neck is supple without adenopathy or thyromegaly. Cardiac exam shows a regular sinus rhythm without murmurs or gallops. Lungs show scattered rhonchi        Assessment & Plan:  Acute bronchitis, unspecified organism - Plan: DG Chest 2 View, amoxicillin-clavulanate (AUGMENTIN) 875-125 MG tablet, albuterol (PROVENTIL HFA;VENTOLIN HFA) 108 (90 Base) MCG/ACT inhaler Recommend he check the prescription labeling to see if NSAID daily or twice daily.  If it is daily then the pharmacy gave him the wrong dosing regimen.  If not then he made a mistake and nothing further needs to be done other than taking it twice per day.  Also gave him Ventolin to help with the coughing.  Since he has had it for this long, a chest x-ray is certainly reasonable

## 2018-08-20 DIAGNOSIS — K635 Polyp of colon: Secondary | ICD-10-CM | POA: Diagnosis not present

## 2018-08-27 ENCOUNTER — Other Ambulatory Visit: Payer: Self-pay

## 2018-08-27 ENCOUNTER — Telehealth: Payer: Self-pay | Admitting: Family Medicine

## 2018-08-27 DIAGNOSIS — D225 Melanocytic nevi of trunk: Secondary | ICD-10-CM | POA: Diagnosis not present

## 2018-08-27 DIAGNOSIS — Z85820 Personal history of malignant melanoma of skin: Secondary | ICD-10-CM | POA: Diagnosis not present

## 2018-08-27 DIAGNOSIS — D1801 Hemangioma of skin and subcutaneous tissue: Secondary | ICD-10-CM | POA: Diagnosis not present

## 2018-08-27 DIAGNOSIS — R059 Cough, unspecified: Secondary | ICD-10-CM

## 2018-08-27 DIAGNOSIS — L720 Epidermal cyst: Secondary | ICD-10-CM | POA: Diagnosis not present

## 2018-08-27 DIAGNOSIS — L821 Other seborrheic keratosis: Secondary | ICD-10-CM | POA: Diagnosis not present

## 2018-08-27 DIAGNOSIS — D692 Other nonthrombocytopenic purpura: Secondary | ICD-10-CM | POA: Diagnosis not present

## 2018-08-27 DIAGNOSIS — L723 Sebaceous cyst: Secondary | ICD-10-CM | POA: Diagnosis not present

## 2018-08-27 DIAGNOSIS — Z85828 Personal history of other malignant neoplasm of skin: Secondary | ICD-10-CM | POA: Diagnosis not present

## 2018-08-27 DIAGNOSIS — R05 Cough: Secondary | ICD-10-CM

## 2018-08-27 DIAGNOSIS — R062 Wheezing: Secondary | ICD-10-CM

## 2018-08-27 DIAGNOSIS — D485 Neoplasm of uncertain behavior of skin: Secondary | ICD-10-CM | POA: Diagnosis not present

## 2018-08-27 NOTE — Telephone Encounter (Signed)
He says he has tried 3 different antibiotics and has a negative chest x-ray, go ahead and refer him to pulmonary.

## 2018-08-27 NOTE — Telephone Encounter (Signed)
Pt called and states he is no better he is still congested in his chest, head and is still coughing, and has clear and yellow mucous, he has finished taking his antibiotic, that was given, states this is his 3rd round, please advise what is next, if he needs another anabiotic  pt can be reached at   [phone] and pt uses [place], [place] - [address]

## 2018-08-27 NOTE — Telephone Encounter (Signed)
Order put in to [place]. [place]

## 2018-08-30 ENCOUNTER — Telehealth: Payer: Self-pay

## 2018-08-30 DIAGNOSIS — K635 Polyp of colon: Secondary | ICD-10-CM | POA: Insufficient documentation

## 2018-08-30 NOTE — Telephone Encounter (Signed)
Pt called and wanted to know about his referral to pulmonary and was advised  By VM that we have no way of knowing when they will pick it up and a call will be placed Monday to see how far he is on their list. [facility]

## 2018-09-02 ENCOUNTER — Encounter: Payer: Self-pay | Admitting: Family Medicine

## 2018-09-09 ENCOUNTER — Encounter: Payer: Self-pay | Admitting: Pulmonary Disease

## 2018-09-09 ENCOUNTER — Ambulatory Visit: Payer: Medicare HMO | Admitting: Pulmonary Disease

## 2018-09-09 ENCOUNTER — Other Ambulatory Visit (INDEPENDENT_AMBULATORY_CARE_PROVIDER_SITE_OTHER): Payer: Medicare HMO

## 2018-09-09 VITALS — BP 124/74 | HR 86 | Ht 66.0 in | Wt 194.4 lb

## 2018-09-09 DIAGNOSIS — R05 Cough: Secondary | ICD-10-CM

## 2018-09-09 DIAGNOSIS — R059 Cough, unspecified: Secondary | ICD-10-CM

## 2018-09-09 LAB — CBC WITH DIFFERENTIAL/PLATELET
BASOS PCT: 0.9 % (ref 0.0–3.0)
Basophils Absolute: 0.1 10*3/uL (ref 0.0–0.1)
EOS PCT: 1.9 % (ref 0.0–5.0)
Eosinophils Absolute: 0.2 10*3/uL (ref 0.0–0.7)
HEMATOCRIT: 44.2 % (ref 39.0–52.0)
HEMOGLOBIN: 15.3 g/dL (ref 13.0–17.0)
LYMPHS PCT: 26.5 % (ref 12.0–46.0)
Lymphs Abs: 2.2 10*3/uL (ref 0.7–4.0)
MCHC: 34.5 g/dL (ref 30.0–36.0)
MCV: 96.8 fl (ref 78.0–100.0)
MONOS PCT: 6.1 % (ref 3.0–12.0)
Monocytes Absolute: 0.5 10*3/uL (ref 0.1–1.0)
NEUTROS ABS: 5.3 10*3/uL (ref 1.4–7.7)
Neutrophils Relative %: 64.6 % (ref 43.0–77.0)
Platelets: 191 10*3/uL (ref 150.0–400.0)
RBC: 4.57 Mil/uL (ref 4.22–5.81)
RDW: 12.4 % (ref 11.5–15.5)
WBC: 8.2 10*3/uL (ref 4.0–10.5)

## 2018-09-09 LAB — NITRIC OXIDE: NITRIC OXIDE: 45

## 2018-09-09 MED ORDER — METHYLPREDNISOLONE ACETATE 80 MG/ML IJ SUSP
80.0000 mg | Freq: Once | INTRAMUSCULAR | Status: AC
  Administered 2018-09-09: 80 mg via INTRAMUSCULAR

## 2018-09-09 MED ORDER — BUDESONIDE-FORMOTEROL FUMARATE 160-4.5 MCG/ACT IN AERO: 2.0000 | INHALATION_SPRAY | Freq: Two times a day (BID) | RESPIRATORY_TRACT | 6 refills | Status: AC

## 2018-09-09 MED ORDER — PREDNISONE 10 MG PO TABS: ORAL_TABLET | ORAL | 0 refills | Status: DC

## 2018-09-09 MED ORDER — IPRATROPIUM-ALBUTEROL 0.5-2.5 (3) MG/3ML IN SOLN
3.0000 mL | Freq: Once | RESPIRATORY_TRACT | Status: AC
  Administered 2018-09-09: 3 mL via RESPIRATORY_TRACT

## 2018-09-09 NOTE — Addendum Note (Signed)
Addended byMarshell Garfinkel on: 09/09/2018 11:14 AM   Modules accepted: Level of Service

## 2018-09-09 NOTE — Progress Notes (Signed)
Jim Clarke    [id number]    1940/12/05  Primary Care Physician:Lalonde, Elyse Jarvis, MD  Referring Physician: Denita Lung, [place], [place] [address]  Chief complaint: Consult for cough  HPI: 77 year old with history of diabetes, GERD, hypertension, hyperlipidemia Complains of productive cough with clear mucus, wheezing and nasal congestion for the past 3 months.  He was treated with 3 different courses of antibiotic including Z-Pak, Augmentin, levofloxacin with no improvement in symptoms.  Chest x-ray did not show any acute abnormality.  He has been referred to pulmonary for further evaluation.  Denies any seasonal allergies, rhinitis, postnasal drip, GERD symptoms Occasionally snores, denies any daytime sleepiness He is on lisinopril for hypertension but has been on this for decades without any issue.  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 Exposures: Reports remote exposure to asbestos. Exposure to asbestos, no mold, hot tub, Jacuzzi. Smoking history: 105-pack-year smoker, quit in 1999. Travel history: No significant travel history Relevant family history: No significant history of lung disease.  Outpatient Encounter Medications as of 09/09/2018  Medication Sig  . albuterol (PROVENTIL HFA;VENTOLIN HFA) 108 (90 Base) MCG/ACT inhaler Inhale 2 puffs into the lungs every 6 (six) hours as needed for wheezing or shortness of breath.  Marland Kitchen aspirin 81 MG tablet Take 81 mg by mouth daily.    Marland Kitchen atorvastatin (LIPITOR) 20 MG tablet Take 1 tablet (20 mg total) by mouth daily.  . Blood Glucose Monitoring Suppl (ONE TOUCH ULTRA 2) w/Device KIT 1 each by Does not apply route daily. Pt testing twice daily  . Blood Pressure Monitoring (SPHYGMOMANOMETER) MISC 1 Device by Does not apply route every morning.  Marland Kitchen glipiZIDE (GLUCOTROL) 5 MG tablet Take 1 tablet (5 mg total) by mouth daily.  Marland Kitchen glucose blood  (ONE TOUCH ULTRA TEST) test strip 1 each by Other route 2 (two) times daily. Use as instructed Patient is to test bid [id number]  . glucose blood test strip Use as instructed pt is to test two times daily 250.00  . Lancets (ONETOUCH ULTRASOFT) lancets Use as instructed  . lisinopril (PRINIVIL,ZESTRIL) 5 MG tablet   . magnesium 30 MG tablet Take 30 mg by mouth 2 (two) times daily.  . metFORMIN (GLUCOPHAGE) 1000 MG tablet Take 1 tablet (1,000 mg total) by mouth 2 (two) times daily with a meal.  . Multiple Vitamins-Minerals (MULTIVITAMIN WITH MINERALS) tablet Take 1 tablet by mouth daily.    . promethazine-codeine (PHENERGAN WITH CODEINE) 6.25-10 MG/5ML syrup Take 5 mLs by mouth every 6 (six) hours as needed for cough.  . [DISCONTINUED] amoxicillin (AMOXIL) 875 MG tablet Take 1 tablet (875 mg total) by mouth 2 (two) times daily.  . [DISCONTINUED] amoxicillin-clavulanate (AUGMENTIN) 875-125 MG tablet Take 1 tablet by mouth 2 (two) times daily.  . [DISCONTINUED] azithromycin (ZITHROMAX) 250 MG tablet 2 tablets day 1, then 1 tablet days 2-4  . [DISCONTINUED] levofloxacin (LEVAQUIN) 500 MG tablet Take 1 tablet (500 mg total) by mouth daily.   No facility-administered encounter medications on file as of 09/09/2018.     Allergies as of 09/09/2018 - Review Complete 09/09/2018  Allergen Reaction Noted  . Sulfa antibiotics  06/26/2011    Past Medical History:  Diagnosis Date  . Arthritis   . Calcium oxalate renal stones   . Diabetes mellitus   . Dyslipidemia   . ED (erectile dysfunction)   . Former smoker  quit years ago as of 2013  . GERD (gastroesophageal reflux disease)   . Hypertension   . Superficial basal cell carcinoma 08/31/15    Past Surgical History:  Procedure Laterality Date  . APPENDECTOMY    . CATARACT EXTRACTION W/ INTRAOCULAR LENS  IMPLANT, BILATERAL Bilateral 2016  . COLONOSCOPY  2004,2009,2011  . KNEE ARTHROSCOPY     RIGHT  Eulas Post MD)  . UPPER GASTROINTESTINAL  ENDOSCOPY     schatzki ring,  [place] Grade A reflux esophagitis    Family History  Problem Relation Age of Onset  . Arthritis Mother   . Mental illness Mother   . Seizures Mother   . Arthritis Father   . Cancer Father   . Diabetes Father     Social History   Socioeconomic History  . Marital status: Married    Spouse name: Not on file  . Number of children: Not on file  . Years of education: Not on file  . Highest education level: Not on file  Occupational History  . Not on file  Social Needs  . Financial resource strain: Not on file  . Food insecurity:    Worry: Not on file    Inability: Not on file  . Transportation needs:    Medical: Not on file    Non-medical: Not on file  Tobacco Use  . Smoking status: Former Smoker    Packs/day: 3.00    Years: 35.00    Pack years: 105.00    Last attempt to quit: 1999    Years since quitting: 20.8  . Smokeless tobacco: Never Used  Substance and Sexual Activity  . Alcohol use: Yes    Alcohol/week: 1.0 standard drinks    Types: 1 Cans of beer per week  . Drug use: No  . Sexual activity: Not Currently  Lifestyle  . Physical activity:    Days per week: Not on file    Minutes per session: Not on file  . Stress: Not on file  Relationships  . Social connections:    Talks on phone: Not on file    Gets together: Not on file    Attends religious service: Not on file    Active member of club or organization: Not on file    Attends meetings of clubs or organizations: Not on file    Relationship status: Not on file  . Intimate partner violence:    Fear of current or ex partner: Not on file    Emotionally abused: Not on file    Physically abused: Not on file    Forced sexual activity: Not on file  Other Topics Concern  . Not on file  Social History Narrative  . Not on file    Review of systems: Review of Systems  Constitutional: Negative for fever and chills.  HENT: Negative.   Eyes: Negative for blurred vision.    Respiratory: as per HPI  Cardiovascular: Negative for chest pain and palpitations.  Gastrointestinal: Negative for vomiting, diarrhea, blood per rectum. Genitourinary: Negative for dysuria, urgency, frequency and hematuria.  Musculoskeletal: Negative for myalgias, back pain and joint pain.  Skin: Negative for itching and rash.  Neurological: Negative for dizziness, tremors, focal weakness, seizures and loss of consciousness.  Endo/Heme/Allergies: Negative for environmental allergies.  Psychiatric/Behavioral: Negative for depression, suicidal ideas and hallucinations.  All other systems reviewed and are negative.  Physical Exam: Blood pressure 124/74, pulse 86, height '5\' 6"'$  (1.676 m), weight 194 lb 6.4 oz (88.2 kg), SpO2 95 %.  Gen:      No acute distress HEENT:  EOMI, sclera anicteric Neck:     No masses; no thyromegaly Lungs: bilateral expiratory wheeze, scattered crackles. CV:         Regular rate and rhythm; no murmurs Abd:      + bowel sounds; soft, non-tender; no palpable masses, no distension Ext:    No edema; adequate peripheral perfusion Skin:      Warm and dry; no rash Neuro: alert and oriented x 3 Psych: normal mood and affect  Data Reviewed: Imaging: Chest x-ray 08/16/2018- no active cardiopulmonary disease.  I have reviewed the images personally.  PFTs: Pending  FENO 09/01/2018-45  Labs: CBC 02/25/2018-WBC 11.4, eos 0%  Assessment:  Evaluation for cough Likely has bronchitis with acute asthma exacerbation Examination notable for diffuse wheezing. We will give a depo injection, neb treatment in the office Start prednisone taper at 40 mg.  Reduce dose by 10 mg every 3 days Start Symbicort 160/4.5 Check CBC differential, IgE level  He has already received adequate antibiotic therapy and will not need additional treatment Get pulmonary function tests on return visit to evaluate for underlying COPD.  Plan/Recommendations: - DuoNeb, depo injection - Prednisone  taper, start Symbicort - Check CBC differential, IgE - PFTs on return visit.  Marshell Garfinkel MD [facility] 09/09/2018, 10:30 AM  CC: Denita Lung, MD

## 2018-09-09 NOTE — Addendum Note (Signed)
Addended by: Parke Poisson E on: 09/09/2018 11:29 AM   Modules accepted: Orders

## 2018-09-09 NOTE — Patient Instructions (Addendum)
We will give you a depot injection today and start a prednisone taper starting at 40 mg.  Reduce dose by 10 mg every 3 days We will start you on Symbicort 160/4.5 twice a day.  Use this every day Check blood tests including CBC differential, IgE Follow-up in 1 to 2 months with pulmonary function test.

## 2018-09-10 LAB — IGE: IgE (Immunoglobulin E), Serum: 88 kU/L (ref <=114)

## 2018-09-19 DIAGNOSIS — L720 Epidermal cyst: Secondary | ICD-10-CM | POA: Diagnosis not present

## 2018-09-19 DIAGNOSIS — D485 Neoplasm of uncertain behavior of skin: Secondary | ICD-10-CM | POA: Diagnosis not present

## 2018-09-19 DIAGNOSIS — Z85828 Personal history of other malignant neoplasm of skin: Secondary | ICD-10-CM | POA: Diagnosis not present

## 2018-09-19 DIAGNOSIS — L821 Other seborrheic keratosis: Secondary | ICD-10-CM | POA: Diagnosis not present

## 2018-09-19 HISTORY — PX: SKIN BIOPSY: SHX1

## 2018-09-26 DIAGNOSIS — Z4802 Encounter for removal of sutures: Secondary | ICD-10-CM | POA: Diagnosis not present

## 2018-09-27 ENCOUNTER — Encounter: Payer: Self-pay | Admitting: Family Medicine

## 2018-10-29 ENCOUNTER — Ambulatory Visit: Payer: Medicare HMO | Admitting: Pulmonary Disease

## 2018-10-29 ENCOUNTER — Encounter: Payer: Self-pay | Admitting: Pulmonary Disease

## 2018-10-29 ENCOUNTER — Ambulatory Visit (INDEPENDENT_AMBULATORY_CARE_PROVIDER_SITE_OTHER): Payer: Medicare HMO | Admitting: Pulmonary Disease

## 2018-10-29 VITALS — BP 124/74 | HR 80 | Ht 67.0 in | Wt 197.0 lb

## 2018-10-29 DIAGNOSIS — R05 Cough: Secondary | ICD-10-CM

## 2018-10-29 DIAGNOSIS — R059 Cough, unspecified: Secondary | ICD-10-CM

## 2018-10-29 LAB — PULMONARY FUNCTION TEST
DL/VA % PRED: 125 %
DL/VA: 5.53 ml/min/mmHg/L
DLCO cor % pred: 93 %
DLCO cor: 26.48 ml/min/mmHg
DLCO unc % pred: 95 %
DLCO unc: 26.99 ml/min/mmHg
FEF 25-75 Post: 2.6 L/sec
FEF 25-75 Pre: 2.28 L/sec
FEF2575-%CHANGE-POST: 13 %
FEF2575-%PRED-POST: 136 %
FEF2575-%Pred-Pre: 119 %
FEV1-%Change-Post: 4 %
FEV1-%Pred-Post: 90 %
FEV1-%Pred-Pre: 86 %
FEV1-PRE: 2.3 L
FEV1-Post: 2.39 L
FEV1FVC-%CHANGE-POST: 0 %
FEV1FVC-%PRED-PRE: 111 %
FEV6-%Change-Post: 4 %
FEV6-%PRED-PRE: 82 %
FEV6-%Pred-Post: 86 %
FEV6-PRE: 2.85 L
FEV6-Post: 2.98 L
FEV6FVC-%PRED-POST: 107 %
FEV6FVC-%Pred-Pre: 107 %
FVC-%CHANGE-POST: 4 %
FVC-%PRED-POST: 80 %
FVC-%PRED-PRE: 76 %
FVC-POST: 2.98 L
FVC-Pre: 2.85 L
PRE FEV6/FVC RATIO: 100 %
Post FEV1/FVC ratio: 80 %
Post FEV6/FVC ratio: 100 %
Pre FEV1/FVC ratio: 81 %
RV % PRED: 89 %
RV: 2.16 L
TLC % pred: 80 %
TLC: 5.16 L

## 2018-10-29 MED ORDER — MOMETASONE FUROATE 50 MCG/ACT NA SUSP: 2.0000 | Freq: Every day | NASAL | 11 refills | Status: AC

## 2018-10-29 NOTE — Progress Notes (Signed)
pft completed 10/29/18

## 2018-10-29 NOTE — Patient Instructions (Signed)
We will start you on chlorpheniramine 8 mg 3 times daily and nasonex nasal spray We will also start Prilosec 20 mg twice daily for acid reflux All these medications are available over-the-counter  Follow-up in 1 month for review of symptoms.

## 2018-10-29 NOTE — Progress Notes (Signed)
Jim Clarke    [id number]    07-20-1941  Primary Care Physician:Lalonde, Elyse Jarvis, MD  Referring Physician: Denita Lung, [address] [place], [place] [address]  Chief complaint: Follow up for cough  HPI: 77 year old with history of diabetes, GERD, hypertension, hyperlipidemia Complains of productive cough with clear mucus, wheezing and nasal congestion for the past 3 months.  He was treated with 3 different courses of antibiotic including Z-Pak, Augmentin, levofloxacin with no improvement in symptoms.  Chest x-ray did not show any acute abnormality.  He has been referred to pulmonary for further evaluation.  Denies any seasonal allergies, rhinitis, postnasal drip, GERD symptoms Occasionally snores, denies any daytime sleepiness He is on lisinopril for hypertension but has been on this for decades without any issue.  Pets: No pets, farm animals Occupation: Used to work as a Museum/gallery curator. Exposures: Reports remote exposure to asbestos. Exposure to asbestos, no mold, hot tub, Jacuzzi. Smoking history: 105-pack-year smoker, quit in 1999. Travel history: No significant travel history Relevant family history: No significant history of lung disease.  Interim history: Given a prednisone taper at last visit on Symbicort.  He feels that this did not help with his symptoms at all and he stopped using the inhaler Continues to have cough with nasal congestion, sinus pressure.  Outpatient Encounter Medications as of 10/29/2018  Medication Sig  . aspirin 81 MG tablet Take 81 mg by mouth daily.    Marland Kitchen atorvastatin (LIPITOR) 20 MG tablet Take 1 tablet (20 mg total) by mouth daily.  . Blood Glucose Monitoring Suppl (ONE TOUCH ULTRA 2) w/Device KIT 1 each by Does not apply route daily. Pt testing twice daily  . Blood Pressure Monitoring (SPHYGMOMANOMETER) MISC 1 Device by Does not apply route every morning.  Marland Kitchen glipiZIDE (GLUCOTROL) 5 MG tablet  Take 1 tablet (5 mg total) by mouth daily. (Patient taking differently: Take 5 mg by mouth 2 (two) times daily before a meal. )  . glucose blood (ONE TOUCH ULTRA TEST) test strip 1 each by Other route 2 (two) times daily. Use as instructed Patient is to test bid DX:E11.9  . glucose blood test strip Use as instructed pt is to test two times daily 250.00  . Lancets (ONETOUCH ULTRASOFT) lancets Use as instructed  . lisinopril (PRINIVIL,ZESTRIL) 5 MG tablet   . metFORMIN (GLUCOPHAGE) 1000 MG tablet Take 1 tablet (1,000 mg total) by mouth 2 (two) times daily with a meal.  . Multiple Vitamins-Minerals (MULTIVITAMIN WITH MINERALS) tablet Take 1 tablet by mouth daily.    Marland Kitchen albuterol (PROVENTIL HFA;VENTOLIN HFA) 108 (90 Base) MCG/ACT inhaler Inhale 2 puffs into the lungs every 6 (six) hours as needed for wheezing or shortness of breath. (Patient not taking: Reported on 10/29/2018)  . budesonide-formoterol (SYMBICORT) 160-4.5 MCG/ACT inhaler Inhale 2 puffs into the lungs 2 (two) times daily for 1 day. (Patient not taking: Reported on 10/29/2018)  . [DISCONTINUED] magnesium 30 MG tablet Take 30 mg by mouth 2 (two) times daily.  . [DISCONTINUED] predniSONE (DELTASONE) 10 MG tablet 4 tabs x 3 days, 3 tabs x 3 days, 2 tabs x 3 days, 1 tab x 3 days then stop  . [DISCONTINUED] promethazine-codeine (PHENERGAN WITH CODEINE) 6.25-10 MG/5ML syrup Take 5 mLs by mouth every 6 (six) hours as needed for cough. (Patient not taking: Reported on 10/29/2018)   No facility-administered encounter medications on file as of 10/29/2018.    Physical Exam: Blood  pressure 124/74, pulse 80, height _0  (1.702 m), weight 197 lb (89.4 kg), SpO2 94 %. Gen:      No acute distress HEENT:  EOMI, sclera anicteric Neck:     No masses; no thyromegaly Lungs:    Clear to auscultation bilaterally; normal respiratory effort CV:         Regular rate and rhythm; no murmurs Abd:      + bowel sounds; soft, non-tender; no palpable masses, no  distension Ext:    No edema; adequate peripheral perfusion Skin:      Warm and dry; no rash Neuro: alert and oriented x 3 Psych: normal mood and affect  Data Reviewed: Imaging: Chest x-ray 08/16/2018- no active cardiopulmonary disease.  I have reviewed the images personally.  PFTs: 10/19/2018-FVC 2.98 [80%], FEV1 2.39 [90%], F/F 80, TLC 80%, DLCO 95% Normal test  FENO 09/01/2018-45  Labs: CBC 02/25/2018-WBC 11.4, eos 0% CBC 09/09/2018-WBC 8.2, eos 1.9%, absolute eosinophil count 156 IgE 09/09/18-88  Assessment:  Evaluation for cough PFTs reviewed with no significant obstruction He has not responded to prednisone and Symbicort making diagnosis of asthma less likely  Elevated FeNO may be coming from sinusitis. We will treat with chlorpheniramine and Nasonex spray Suspect GERD is playing a role in symptom as well.  We will also start Prilosec 20 mg twice daily  I have recommended stopping the lisinopril but he is reluctant to do so as it has worked well for him for many years If his symptoms do not improve with the above regimen then we will reconsider.  Plan/Recommendations: - Chlorphentermine, Nasonex - Prilosec 20 mg twice daily  Marshell Garfinkel MD [name] 10/29/2018, 11:16 AM  CC: Denita Lung, MD

## 2018-12-05 ENCOUNTER — Encounter: Payer: Self-pay | Admitting: Pulmonary Disease

## 2018-12-05 ENCOUNTER — Ambulatory Visit: Payer: Medicare HMO | Admitting: Pulmonary Disease

## 2018-12-05 VITALS — BP 144/68 | HR 108 | Ht 67.0 in | Wt 198.4 lb

## 2018-12-05 DIAGNOSIS — R05 Cough: Secondary | ICD-10-CM | POA: Diagnosis not present

## 2018-12-05 DIAGNOSIS — R059 Cough, unspecified: Secondary | ICD-10-CM

## 2018-12-05 DIAGNOSIS — J329 Chronic sinusitis, unspecified: Secondary | ICD-10-CM

## 2018-12-05 LAB — NITRIC OXIDE: Nitric Oxide: 19

## 2018-12-05 NOTE — Progress Notes (Addendum)
Jim Clarke    [id number]    1940/12/11  Primary Care Physician:Lalonde, Elyse Jarvis, MD  Referring Physician: Denita Lung, [place] [place], [place] [address]  Chief complaint: Follow up for cough  HPI: 78 year old with history of diabetes, GERD, hypertension, hyperlipidemia Complains of productive cough with clear mucus, wheezing and nasal congestion for the past 3 months.  He was treated with 3 different courses of antibiotic including Z-Pak, Augmentin, levofloxacin with no improvement in symptoms.  Chest x-ray did not show any acute abnormality.  He has been referred to pulmonary for further evaluation.  Denies any seasonal allergies, rhinitis, postnasal drip, GERD symptoms Occasionally snores, denies any daytime sleepiness He is on lisinopril for hypertension but has been on this for decades without any issue.  Pets: No pets, farm animals Occupation: Used to work as a Museum/gallery curator. Exposures: Reports remote exposure to asbestos. Exposure to asbestos, no mold, hot tub, Jacuzzi. Smoking history: 105-pack-year smoker, quit in 1999. Travel history: No significant travel history Relevant family history: No significant history of lung disease.  Interim history: States that cough is improved overall.  Reports 75 to 80% improvement His last paroxysms of cough Has noticed clear nasal discharge with occasional blood.  Outpatient Encounter Medications as of 12/05/2018  Medication Sig  . aspirin 81 MG tablet Take 81 mg by mouth daily.    Marland Kitchen atorvastatin (LIPITOR) 20 MG tablet Take 1 tablet (20 mg total) by mouth daily.  . Blood Glucose Monitoring Suppl (ONE TOUCH ULTRA 2) w/Device KIT 1 each by Does not apply route daily. Pt testing twice daily  . Blood Pressure Monitoring (SPHYGMOMANOMETER) MISC 1 Device by Does not apply route every morning.  Marland Kitchen glipiZIDE (GLUCOTROL) 5 MG tablet Take 1 tablet (5 mg total) by mouth daily. (Patient  taking differently: Take 5 mg by mouth 2 (two) times daily before a meal. )  . glucose blood (ONE TOUCH ULTRA TEST) test strip 1 each by Other route 2 (two) times daily. Use as instructed Patient is to test bid DX:E11.9  . glucose blood test strip Use as instructed pt is to test two times daily 250.00  . Lancets (ONETOUCH ULTRASOFT) lancets Use as instructed  . lisinopril (PRINIVIL,ZESTRIL) 5 MG tablet   . metFORMIN (GLUCOPHAGE) 1000 MG tablet Take 1 tablet (1,000 mg total) by mouth 2 (two) times daily with a meal.  . mometasone (NASONEX) 50 MCG/ACT nasal spray Place 2 sprays into the nose daily.  . Multiple Vitamins-Minerals (MULTIVITAMIN WITH MINERALS) tablet Take 1 tablet by mouth daily.    Marland Kitchen albuterol (PROVENTIL HFA;VENTOLIN HFA) 108 (90 Base) MCG/ACT inhaler Inhale 2 puffs into the lungs every 6 (six) hours as needed for wheezing or shortness of breath. (Patient not taking: Reported on 10/29/2018)  . budesonide-formoterol (SYMBICORT) 160-4.5 MCG/ACT inhaler Inhale 2 puffs into the lungs 2 (two) times daily for 1 day. (Patient not taking: Reported on 10/29/2018)   No facility-administered encounter medications on file as of 12/05/2018.    Physical Exam: Blood pressure (!) 144/68, pulse (!) 108, height '5\' 7"'$  (1.702 m), weight 198 lb 6.4 oz (90 kg), SpO2 94 %. Gen:      No acute distress HEENT:  EOMI, sclera anicteric Neck:     No masses; no thyromegaly Lungs:    Scattered expiratory wheeze. CV:         Regular rate and rhythm; no murmurs Abd:      +  bowel sounds; soft, non-tender; no palpable masses, no distension Ext:    No edema; adequate peripheral perfusion Skin:      Warm and dry; no rash Neuro: alert and oriented x 3 Psych: normal mood and affect  Data Reviewed: Imaging: Chest x-ray 08/16/2018- no active cardiopulmonary disease.  I have reviewed the images personally.  PFTs: 10/19/2018-FVC 2.98 [80%], FEV1 2.39 [90%], F/F 80, TLC 80%, DLCO 95% Normal test  FENO  09/01/2018-45 FENO 12/05/2018-19  Labs: CBC 02/25/2018-WBC 11.4, eos 0% CBC 09/09/2018-WBC 8.2, eos 1.9%, absolute eosinophil count 156 IgE 09/09/18-88  Assessment:  Evaluation for cough PFTs reviewed with no significant obstruction He has not responded to prednisone making diagnosis of asthma less likely  Elevated FeNO may be coming from sinusitis. I have instructed him to stop the Nasonex since he has some blood in nasal discharge We will make referral to ENT We will continue Symbicort for now since he has some wheeze on examination I have recommended stopping the lisinopril but he is reluctant to do so as it has worked well for him for many years  GERD Use Prilosec over-the-counter as needed.  Plan/Recommendations: - Continue Symbicort - ENT referral  Marshell Garfinkel MD [facility] 12/05/2018, 9:08 AM  CC: Denita Lung, MD

## 2018-12-05 NOTE — Patient Instructions (Signed)
Continue the Symbicort for now We will reevaluate at next visit We will make a referral to ENT to evaluate the sinusitis Follow-up in 6 months.

## 2018-12-09 ENCOUNTER — Telehealth: Payer: Self-pay | Admitting: Pulmonary Disease

## 2018-12-09 NOTE — Telephone Encounter (Signed)
LMTCB

## 2018-12-09 NOTE — Telephone Encounter (Signed)
Called and spoke with Patient. He was requesting a print out of his last OV, 12/05/18, with diagnosis listed for his insurance for reimbursement.  OV printed and placed in envelope to be mailed to Patient at his request.  Nothing further at this time.

## 2018-12-09 NOTE — Telephone Encounter (Signed)
Patient returning phone call.  Phone number is [phone].

## 2018-12-12 DIAGNOSIS — J329 Chronic sinusitis, unspecified: Secondary | ICD-10-CM | POA: Diagnosis not present

## 2018-12-12 DIAGNOSIS — K219 Gastro-esophageal reflux disease without esophagitis: Secondary | ICD-10-CM | POA: Diagnosis not present

## 2018-12-24 DIAGNOSIS — R69 Illness, unspecified: Secondary | ICD-10-CM | POA: Diagnosis not present

## 2019-02-09 DIAGNOSIS — R69 Illness, unspecified: Secondary | ICD-10-CM | POA: Diagnosis not present

## 2019-03-27 DIAGNOSIS — R69 Illness, unspecified: Secondary | ICD-10-CM | POA: Diagnosis not present

## 2019-04-24 IMAGING — CR DG CHEST 2V
2 series · 2 of 2 positions shown · non-contrast
Comparison: 02/25/2018

CLINICAL DATA: Cough and congestion for 1 month.

EXAM:
CHEST - 2 VIEW

[w chest pa]
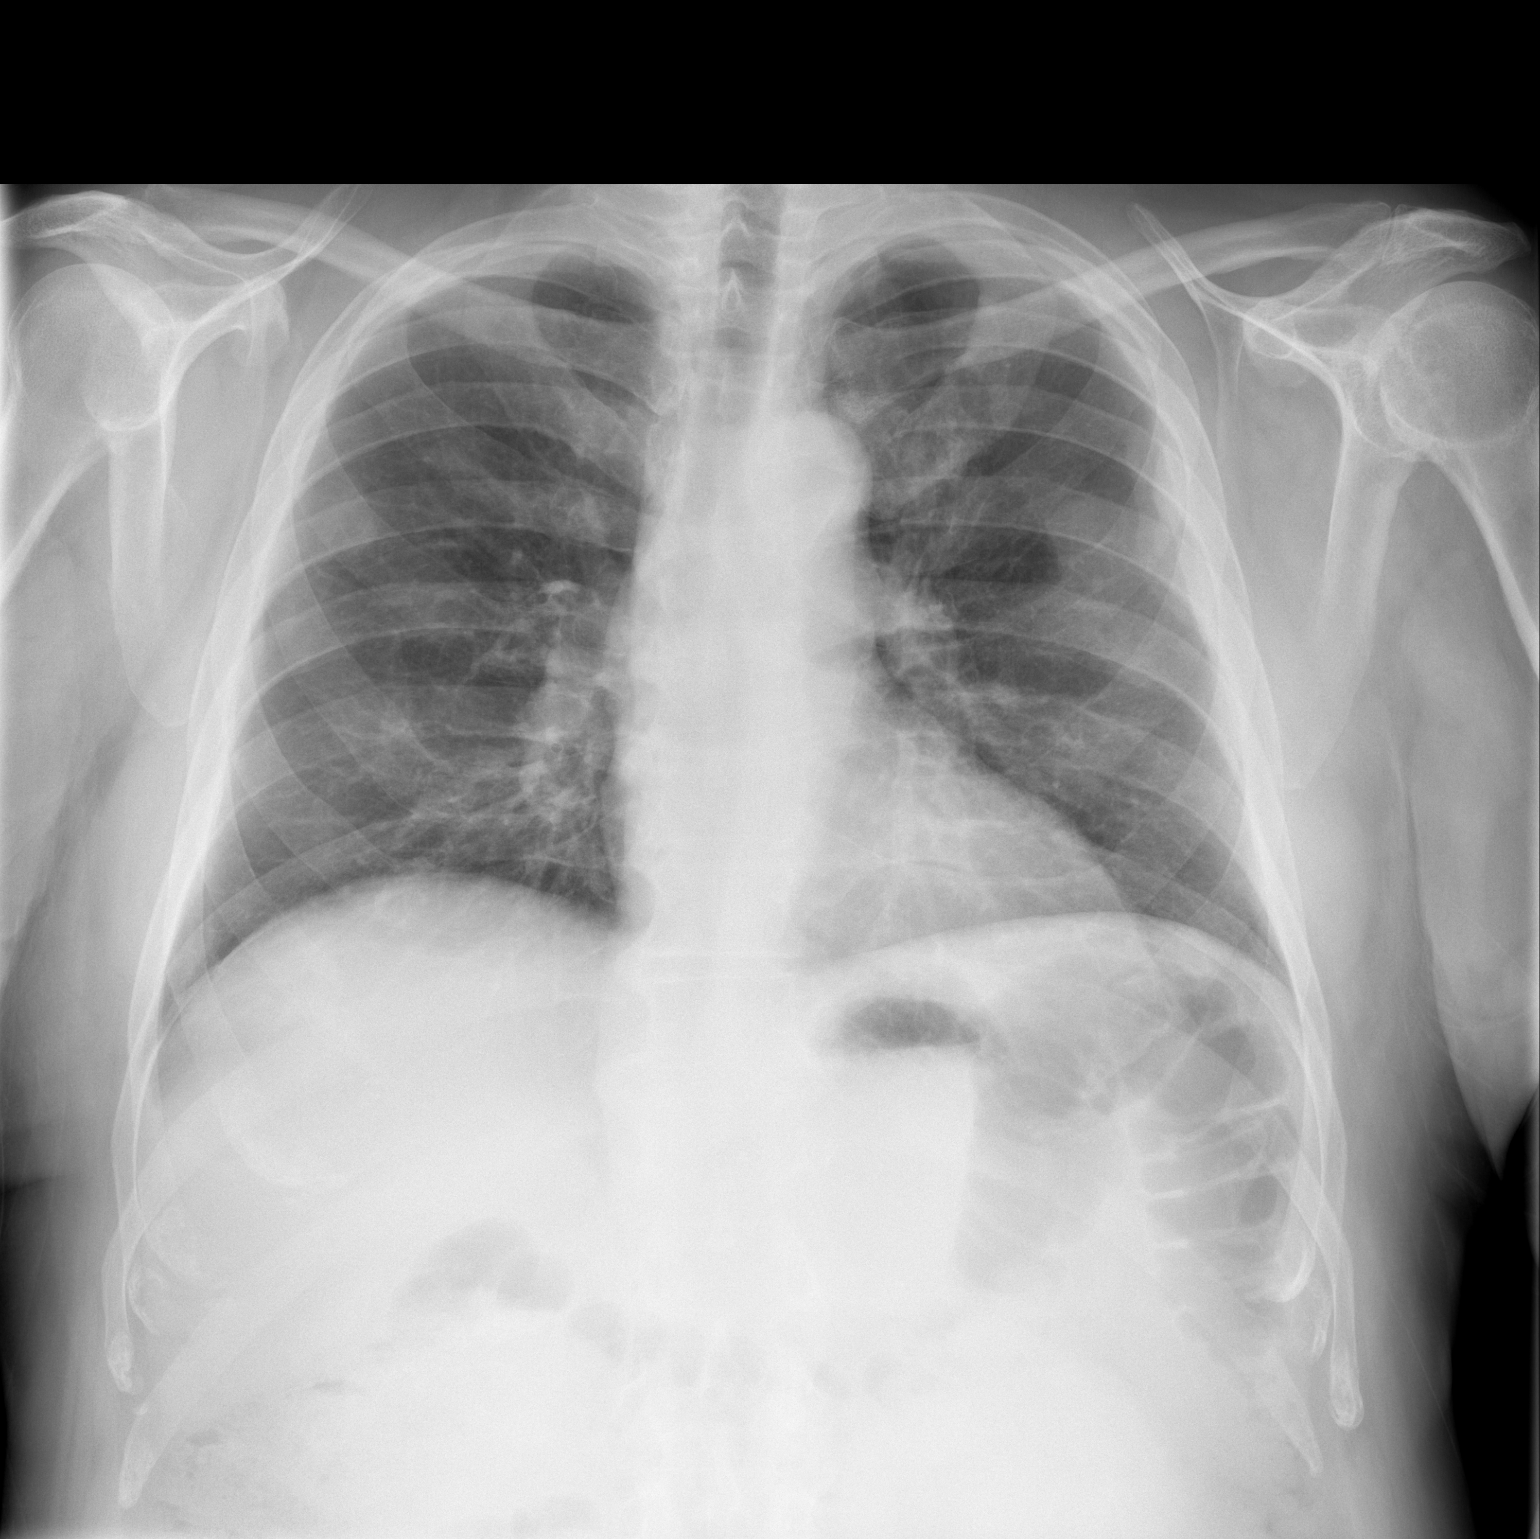

[w chest lat]
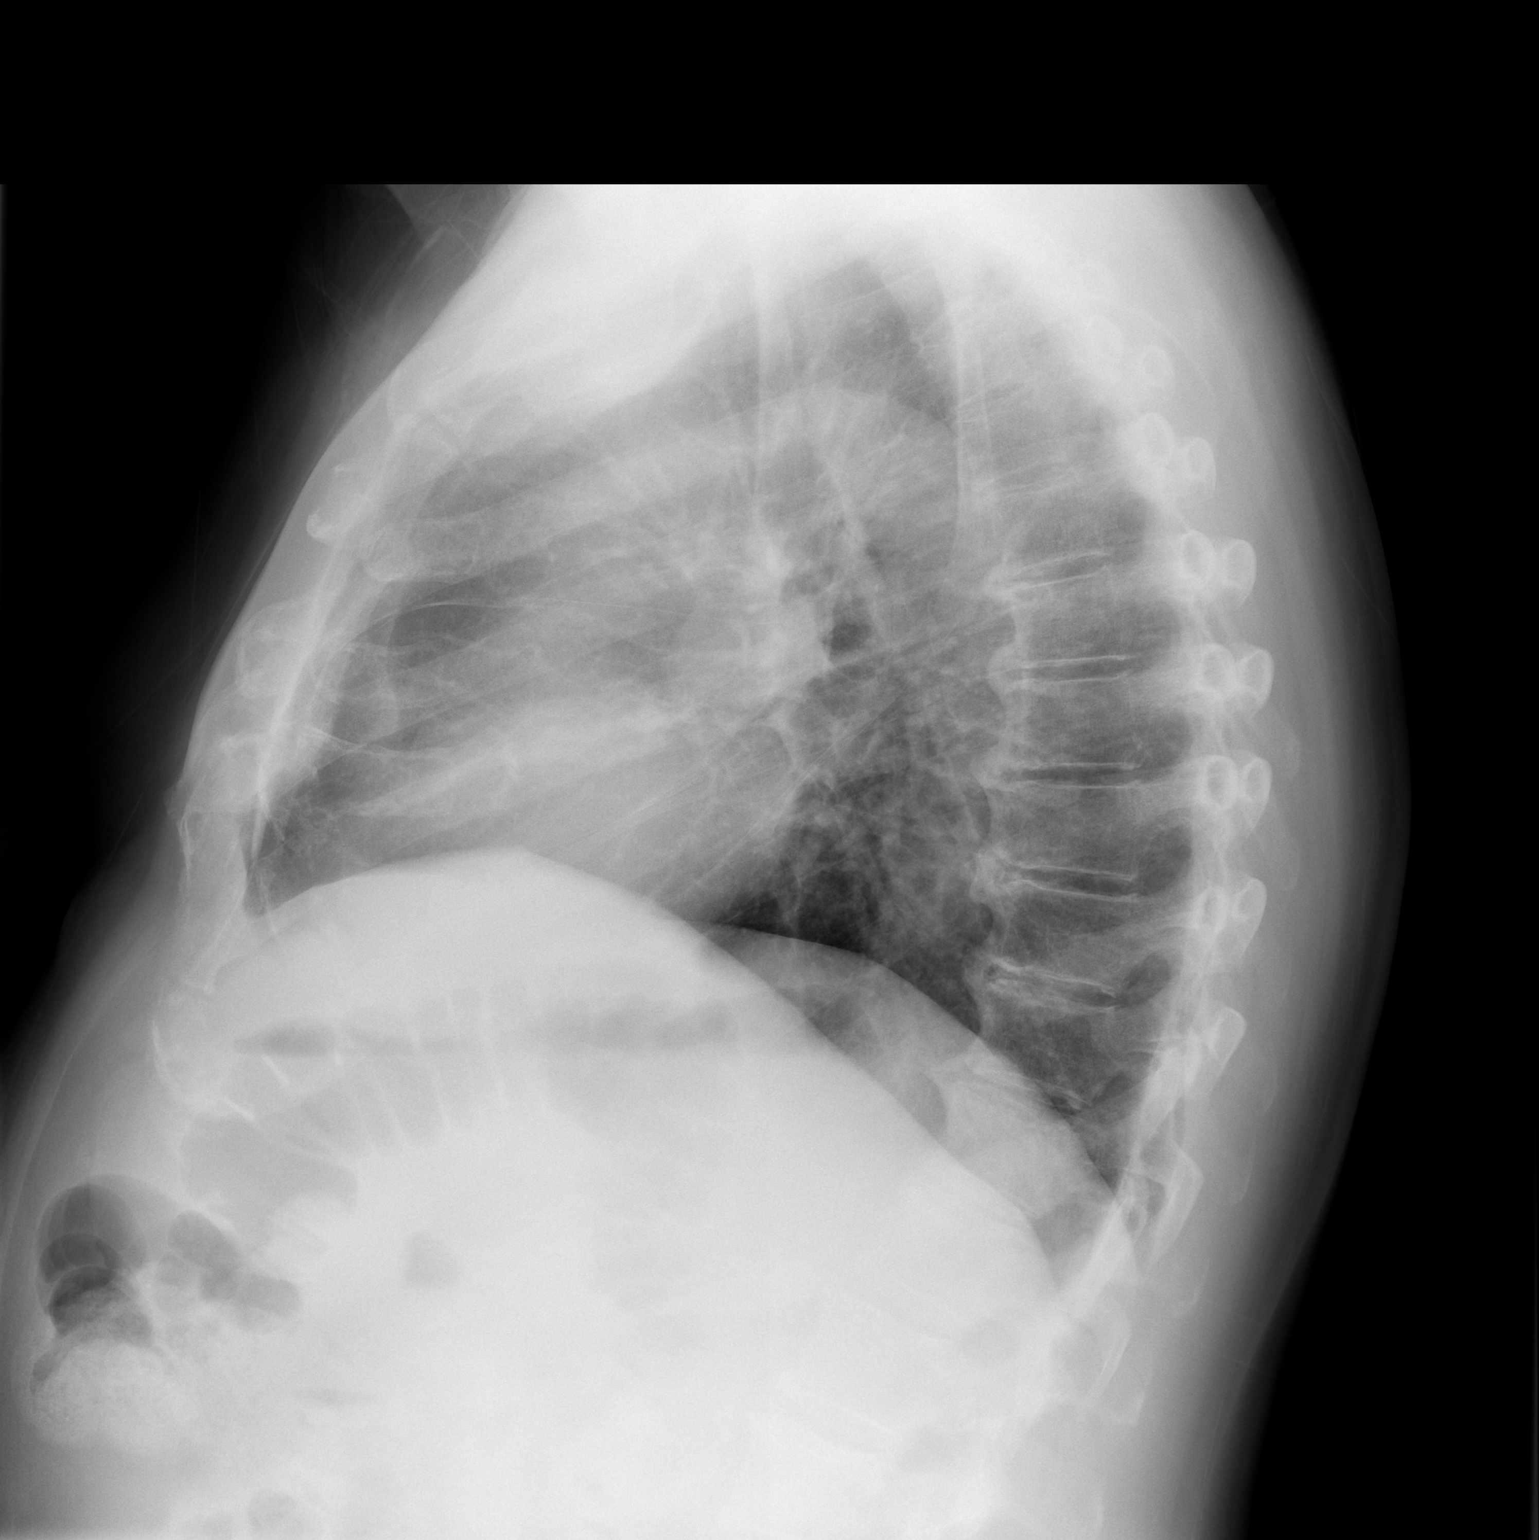

[2 of 2 positions shown; findings below may reference images not displayed]

FINDINGS: Cardiomediastinal silhouette is normal. Mediastinal contours appear
intact.

There is no evidence of focal airspace consolidation, pleural
effusion or pneumothorax.

Osseous structures are without acute abnormality. Soft tissues are
grossly normal.
IMPRESSION: No active cardiopulmonary disease.

## 2019-06-24 DIAGNOSIS — R69 Illness, unspecified: Secondary | ICD-10-CM | POA: Diagnosis not present

## 2019-07-07 DIAGNOSIS — H524 Presbyopia: Secondary | ICD-10-CM | POA: Diagnosis not present

## 2019-07-07 DIAGNOSIS — E119 Type 2 diabetes mellitus without complications: Secondary | ICD-10-CM | POA: Diagnosis not present

## 2019-07-09 LAB — HM DIABETES EYE EXAM

## 2019-07-17 ENCOUNTER — Encounter: Payer: Self-pay | Admitting: *Deleted

## 2019-08-28 DIAGNOSIS — L720 Epidermal cyst: Secondary | ICD-10-CM | POA: Diagnosis not present

## 2019-08-28 DIAGNOSIS — L820 Inflamed seborrheic keratosis: Secondary | ICD-10-CM | POA: Diagnosis not present

## 2019-08-28 DIAGNOSIS — D2262 Melanocytic nevi of left upper limb, including shoulder: Secondary | ICD-10-CM | POA: Diagnosis not present

## 2019-08-28 DIAGNOSIS — D225 Melanocytic nevi of trunk: Secondary | ICD-10-CM | POA: Diagnosis not present

## 2019-08-28 DIAGNOSIS — I788 Other diseases of capillaries: Secondary | ICD-10-CM | POA: Diagnosis not present

## 2019-08-28 DIAGNOSIS — D2261 Melanocytic nevi of right upper limb, including shoulder: Secondary | ICD-10-CM | POA: Diagnosis not present

## 2019-08-28 DIAGNOSIS — Z85828 Personal history of other malignant neoplasm of skin: Secondary | ICD-10-CM | POA: Diagnosis not present

## 2019-08-28 DIAGNOSIS — D485 Neoplasm of uncertain behavior of skin: Secondary | ICD-10-CM | POA: Diagnosis not present

## 2019-08-28 DIAGNOSIS — L821 Other seborrheic keratosis: Secondary | ICD-10-CM | POA: Diagnosis not present

## 2019-08-28 DIAGNOSIS — D692 Other nonthrombocytopenic purpura: Secondary | ICD-10-CM | POA: Diagnosis not present

## 2019-09-21 DIAGNOSIS — R69 Illness, unspecified: Secondary | ICD-10-CM | POA: Diagnosis not present

## 2019-09-30 ENCOUNTER — Telehealth: Payer: Self-pay

## 2019-09-30 ENCOUNTER — Other Ambulatory Visit: Payer: Self-pay

## 2019-09-30 DIAGNOSIS — R69 Illness, unspecified: Secondary | ICD-10-CM | POA: Diagnosis not present

## 2019-09-30 MED ORDER — ONETOUCH ULTRA 2 W/DEVICE KIT: 1.0000 | PACK | Freq: Every day | 0 refills | Status: AC

## 2019-09-30 NOTE — Telephone Encounter (Signed)
Pt. Called stating he needs a new glucose meter called into [facility] on [address] is old one is out of date, needs one touch ultra 2 pt. Last seen 08/16/18.

## 2019-09-30 NOTE — Telephone Encounter (Signed)
Call this in 

## 2019-09-30 NOTE — Telephone Encounter (Signed)
Done KH 

## 2019-09-30 NOTE — Telephone Encounter (Signed)
Pt is being followed by the [facility] for his diabtets. He is requesting to have a new glucose meter script. Pt has not been in for a while. Please advise [facility]

## 2019-10-01 DIAGNOSIS — R69 Illness, unspecified: Secondary | ICD-10-CM | POA: Diagnosis not present

## 2019-12-15 ENCOUNTER — Encounter: Payer: Self-pay | Admitting: Family Medicine

## 2019-12-19 DIAGNOSIS — R69 Illness, unspecified: Secondary | ICD-10-CM | POA: Diagnosis not present

## 2020-01-02 ENCOUNTER — Telehealth: Payer: Self-pay | Admitting: Family Medicine

## 2020-01-02 MED ORDER — METFORMIN HCL 1000 MG PO TABS: 1000.0000 mg | ORAL_TABLET | Freq: Two times a day (BID) | ORAL | 0 refills | Status: AC

## 2020-01-02 NOTE — Telephone Encounter (Signed)
Pt called, he needs refill on Metformin 1000mg  He has been trying to get filled thru [place] for 2 weeks but not getting anywhere   [employer]

## 2020-01-28 ENCOUNTER — Telehealth: Payer: Self-pay | Admitting: Family Medicine

## 2020-01-28 NOTE — Telephone Encounter (Signed)
Pt called and said the [place] didn't send all of his medications and now he is out of the Lisinopril and Jardience which I do not see in his chart. But he wants to know if you could call them in for him to the [facility] on [address]

## 2020-01-28 NOTE — Telephone Encounter (Signed)
Need the dosing of the jardiance

## 2020-01-29 ENCOUNTER — Telehealth: Payer: Self-pay

## 2020-01-29 ENCOUNTER — Other Ambulatory Visit: Payer: Self-pay

## 2020-01-29 MED ORDER — LISINOPRIL 5 MG PO TABS: 5.0000 mg | ORAL_TABLET | Freq: Every day | ORAL | 0 refills | Status: AC

## 2020-01-29 MED ORDER — EMPAGLIFLOZIN 10 MG PO TABS: 10.0000 mg | ORAL_TABLET | Freq: Every day | ORAL | 0 refills | Status: AC

## 2020-01-29 NOTE — Telephone Encounter (Signed)
Pt will call back with info on his dose and directions for  jardance and lisinopril. Thank [facility]

## 2020-01-29 NOTE — Telephone Encounter (Signed)
Med was sent in and pt has enough med to last until his appt. [place]

## 2020-02-25 DIAGNOSIS — R69 Illness, unspecified: Secondary | ICD-10-CM | POA: Diagnosis not present

## 2020-03-31 ENCOUNTER — Other Ambulatory Visit: Payer: Self-pay

## 2020-03-31 ENCOUNTER — Ambulatory Visit (INDEPENDENT_AMBULATORY_CARE_PROVIDER_SITE_OTHER): Payer: Medicare HMO | Admitting: Family Medicine

## 2020-03-31 ENCOUNTER — Encounter: Payer: Self-pay | Admitting: Family Medicine

## 2020-03-31 VITALS — BP 138/80 | HR 61 | Temp 97.3°F | Ht 65.75 in | Wt 205.2 lb

## 2020-03-31 DIAGNOSIS — Z800 Family history of malignant neoplasm of digestive organs: Secondary | ICD-10-CM

## 2020-03-31 DIAGNOSIS — Z85820 Personal history of malignant melanoma of skin: Secondary | ICD-10-CM | POA: Diagnosis not present

## 2020-03-31 DIAGNOSIS — Z87891 Personal history of nicotine dependence: Secondary | ICD-10-CM

## 2020-03-31 DIAGNOSIS — K635 Polyp of colon: Secondary | ICD-10-CM | POA: Diagnosis not present

## 2020-03-31 DIAGNOSIS — N529 Male erectile dysfunction, unspecified: Secondary | ICD-10-CM

## 2020-03-31 DIAGNOSIS — E785 Hyperlipidemia, unspecified: Secondary | ICD-10-CM

## 2020-03-31 DIAGNOSIS — I152 Hypertension secondary to endocrine disorders: Secondary | ICD-10-CM

## 2020-03-31 DIAGNOSIS — E1159 Type 2 diabetes mellitus with other circulatory complications: Secondary | ICD-10-CM

## 2020-03-31 DIAGNOSIS — E1169 Type 2 diabetes mellitus with other specified complication: Secondary | ICD-10-CM

## 2020-03-31 DIAGNOSIS — I7090 Unspecified atherosclerosis: Secondary | ICD-10-CM | POA: Diagnosis not present

## 2020-03-31 DIAGNOSIS — J3089 Other allergic rhinitis: Secondary | ICD-10-CM

## 2020-03-31 DIAGNOSIS — E118 Type 2 diabetes mellitus with unspecified complications: Secondary | ICD-10-CM | POA: Diagnosis not present

## 2020-03-31 DIAGNOSIS — K219 Gastro-esophageal reflux disease without esophagitis: Secondary | ICD-10-CM | POA: Diagnosis not present

## 2020-03-31 DIAGNOSIS — I10 Essential (primary) hypertension: Secondary | ICD-10-CM

## 2020-03-31 DIAGNOSIS — Z0000 Encounter for general adult medical examination without abnormal findings: Secondary | ICD-10-CM

## 2020-03-31 LAB — POCT GLYCOSYLATED HEMOGLOBIN (HGB A1C): Hemoglobin A1C: 7.2 % — AB (ref 4.0–5.6)

## 2020-03-31 NOTE — Progress Notes (Signed)
Jim Clarke is a 79 y.o. male who presents for annual wellness visit,CPE and follow-up on chronic medical conditions.  He does have underlying diabetes and was placed on Jardiance while at the [place] because his hemoglobin A1c was 8.3.  This was about 6 months ago.  He continues on Metformin as well.  He is also taking low-dose lisinopril.  Continues to do well on Lipitor without aches or pains.  He does check his feet regularly and has seen the eye doctor in August of last year.  He also has reflux and is getting good results with using Prilosec every other day.  He does complain of difficulty with perennial nasal congestion watery eyes and rhinorrhea and notes this especially bad when he mows the lawn.  Presently he is not taking any medications for that.  He does have a family history of colon cancer and did have a colonoscopy with recommendation repeating this in 5 years.  The polyp was found but it was hyperplastic.  He also has a history of melanoma several years ago and is now on yearly skin checks.  He did have an ultrasound of his aorta done at the [place] which was apparently negative.  He also apparently had a lung nodule and was followed up with CT scan and showed no change.  He also has underlying ED and will check with the [facility] concerning getting Viagra.  Immunizations and Health Maintenance Immunization History  Administered Date(s) Administered  . Influenza, High Dose Seasonal PF 11/19/2014, 09/11/2018  . Influenza-Unspecified 10/01/2007, 08/25/2009, 08/07/2013, 09/19/2017  . Pneumococcal Polysaccharide-23 05/31/2006  . Td 05/31/2006  . Zoster 05/31/2006   Health Maintenance Due  Topic Date Due  . FOOT EXAM  Never done  . COVID-19 Vaccine (1) Never done  . PNA vac Low Risk Adult (1 of 2 - PCV13) 06/01/2007  . TETANUS/TDAP  05/31/2016  . HEMOGLOBIN A1C  09/03/2018    Last colonoscopy: 08/15/18 Last PSA: 03/20/12 Dentist: Q six months  Ophtho: 07/07/19 Exercise: staying active  Other  doctors caring for patient include: Dr. Idolina Primer eye,  Dr. Constance Holster ENT  Advanced Directives: Not on file  Does Patient Have a Medical Advance Directive?: No Would patient like information on creating a medical advance directive?: Yes (MAU/Ambulatory/Procedural Areas - Information given)  Depression screen:  See questionnaire below.     Depression screen [facility] 2/9 03/31/2020 10/09/2017 02/11/2016  Decreased Interest 0 0 0  Down, Depressed, Hopeless 0 0 0  PHQ - 2 Score 0 0 0    Fall Screen: See Questionaire below.   Fall Risk  03/31/2020 10/09/2017 02/11/2016  Falls in the past year? 0 No No    ADL screen:  See questionnaire below.  Functional Status Survey: Is the patient deaf or have difficulty hearing?: Yes Does the patient have difficulty seeing, even when wearing glasses/contacts?: No Does the patient have difficulty concentrating, remembering, or making decisions?: No Does the patient have difficulty walking or climbing stairs?: Yes(left knee pain) Does the patient have difficulty dressing or bathing?: No Does the patient have difficulty doing errands alone such as visiting a doctor's office or shopping?: No   Review of Systems  Constitutional: -, -unexpected weight change, -anorexia, -fatigue  Dermatology: denies changing moles, rash, lumps ENT: -runny nose, -ear pain, -sore throat,  Cardiology:  -chest pain, -palpitations, -orthopnea, Respiratory:  -shortness of breath, -dyspnea on exertion, -wheezing,  Gastroenterology: -abdominal pain, -nausea, -vomiting, -diarrhea, -constipation, -dysphagia Hematology: -bleeding or bruising problems Musculoskeletal: -arthralgias, -myalgias, -joint  swelling, -back pain, - Ophthalmology: -vision changes,  Urology: -dysuria, -difficulty urinating,  -urinary frequency, -urgency, incontinence Neurology: -, -numbness, , -memory loss, -falls, -dizziness    PHYSICAL EXAM:  BP 138/80   Pulse 61   Temp (!) 97.3 F (36.3 C)   Ht 5' 5.75" (1.67  m)   Wt 205 lb 3.2 oz (93.1 kg)   SpO2 94%   BMI 33.37 kg/m   General Appearance: Alert, cooperative, no distress, appears stated age Head: Normocephalic, without obvious abnormality, atraumatic Eyes: PERRL, conjunctiva/corneas clear, EOM's intact, Ears: Normal TM's and external ear canals Nose: Nares normal, mucosa normal, no drainage or sinus   tenderness Throat: Lips, mucosa, and tongue normal; teeth and gums normal Neck: Supple, no lymphadenopathy, thyroid:no enlargement/tenderness/nodules; no carotid bruit or JVD Lungs: Clear to auscultation bilaterally without wheezes, rales or ronchi; respirations unlabored Heart: Regular rate and rhythm, S1 and S2 normal, no murmur, rub or gallop Abdomen: Soft, non-tender, nondistended, normoactive bowel sounds, no masses, no hepatosplenomegaly Extremities: No clubbing, cyanosis or edema.  Foot exam is recorded and is normal Pulses: 2+ and symmetric all extremities Skin: Skin color, texture, turgor normal, no rashes or lesions Lymph nodes: Cervical, supraclavicular, and axillary nodes normal Neurologic: CNII-XII intact, normal strength, sensation and gait; reflexes 2+ and symmetric throughout   Psych: Normal mood, affect, hygiene and grooming Hemoglobin A1c is 7.2 ASSESSMENT/PLAN: Routine general medical examination at a health care facility  Type 2 diabetes with complication ([place]) - Plan: CBC with Differential/Platelet, Comprehensive metabolic panel, Lipid panel  Hypertension associated with diabetes ([place]) - Plan: CBC with Differential/Platelet, Comprehensive metabolic panel  Hyperplastic polyp of large intestine  Hyperlipidemia associated with type 2 diabetes mellitus ([place]) - Plan: Lipid panel  Former smoker  Erectile dysfunction, unspecified erectile dysfunction type  Atherosclerosis - Plan: Lipid panel  Gastroesophageal reflux disease without esophagitis  History of melanoma  Non-seasonal allergic rhinitis, unspecified  trigger  Family history of colon cancer Recommend treating the allergies to with Claritin and Rhinocort to see if it will help with the symptoms and keep me informed. He will also keep me informed as to whether he wants some sildenafil in the future. Continue on his present medication for reflux which seems to be working.  He will also continue to follow-up with dermatology. Discussed colon cancer screening above age 70.  Explained that he is a young 33 so that does play a role in follow-up colonoscopy.  He was comfortable with that. Continue on present blood pressure, diabetes and cholesterol medications    recommended at least 30 minutes of aerobic activity at least 5 days/week; proper sunscreen use reviewed; healthy diet and alcohol recommendations (less than or equal to 2 drinks/day) reviewed; Marland Kitchen Immunization recommendations discussed.  [facility] Tdap and pneumo 13.  Explained that if we did that here he had have to do it through the pharmacy.  Colonoscopy recommendations reviewed.   Medicare Attestation I have personally reviewed: The patient's medical and social history Their use of alcohol, tobacco or illicit drugs Their current medications and supplements The patient's functional ability including ADLs,fall risks, home safety risks, cognitive, and hearing and visual impairment Diet and physical activities Evidence for depression or mood disorders  The patient's weight, height, and BMI have been recorded in the chart.  I have made referrals, counseling, and provided education to the patient based on review of the above and I have provided the patient with a written personalized care plan for preventive  services.     Jill Alexanders, MD   03/31/2020

## 2020-03-31 NOTE — Patient Instructions (Signed)
  Mr. Rater , Thank you for taking time to come for your Medicare Wellness Visit. I appreciate your ongoing commitment to your health goals. Please review the following plan we discussed and let me know if I can assist you in the future.   These are the goals we discussed: Start taking Claritin and also Rhinocort This is a list of the screening recommended for you and due dates:  Health Maintenance  Topic Date Due  . Complete foot exam   Never done  . COVID-19 Vaccine (1) Never done  . Pneumonia vaccines (1 of 2 - PCV13) 06/01/2007  . Tetanus Vaccine  05/31/2016  . Hemoglobin A1C  09/03/2018  . Flu Shot  06/13/2020  . Eye exam for diabetics  07/08/2020

## 2020-04-01 LAB — COMPREHENSIVE METABOLIC PANEL
ALT: 16 IU/L (ref 0–44)
AST: 19 IU/L (ref 0–40)
Albumin/Globulin Ratio: 1.7 (ref 1.2–2.2)
Albumin: 4.3 g/dL (ref 3.7–4.7)
Alkaline Phosphatase: 78 IU/L (ref 48–121)
BUN/Creatinine Ratio: 24 (ref 10–24)
BUN: 29 mg/dL — ABNORMAL HIGH (ref 8–27)
Bilirubin Total: 0.4 mg/dL (ref 0.0–1.2)
CO2: 23 mmol/L (ref 20–29)
Calcium: 9.2 mg/dL (ref 8.6–10.2)
Chloride: 101 mmol/L (ref 96–106)
Creatinine, Ser: 1.19 mg/dL (ref 0.76–1.27)
GFR calc Af Amer: 68 mL/min/{1.73_m2} (ref >59)
GFR calc non Af Amer: 59 mL/min/{1.73_m2} — ABNORMAL LOW (ref >59)
Globulin, Total: 2.6 g/dL (ref 1.5–4.5)
Glucose: 129 mg/dL — ABNORMAL HIGH (ref 65–99)
Potassium: 4.4 mmol/L (ref 3.5–5.2)
Sodium: 139 mmol/L (ref 134–144)
Total Protein: 6.9 g/dL (ref 6.0–8.5)

## 2020-04-01 LAB — CBC WITH DIFFERENTIAL/PLATELET
Basophils Absolute: 0.1 10*3/uL (ref 0.0–0.2)
Basos: 1 %
EOS (ABSOLUTE): 0.1 10*3/uL (ref 0.0–0.4)
Eos: 3 %
Hematocrit: 45.2 % (ref 37.5–51.0)
Hemoglobin: 15.2 g/dL (ref 13.0–17.7)
Immature Grans (Abs): 0 10*3/uL (ref 0.0–0.1)
Immature Granulocytes: 0 %
Lymphocytes Absolute: 1.4 10*3/uL (ref 0.7–3.1)
Lymphs: 26 %
MCH: 32.1 pg (ref 26.6–33.0)
MCHC: 33.6 g/dL (ref 31.5–35.7)
MCV: 95 fL (ref 79–97)
Monocytes Absolute: 0.5 10*3/uL (ref 0.1–0.9)
Monocytes: 9 %
Neutrophils Absolute: 3.2 10*3/uL (ref 1.4–7.0)
Neutrophils: 61 %
Platelets: 165 10*3/uL (ref 150–450)
RBC: 4.74 x10E6/uL (ref 4.14–5.80)
RDW: 12.8 % (ref 11.6–15.4)
WBC: 5.3 10*3/uL (ref 3.4–10.8)

## 2020-04-01 LAB — LIPID PANEL
Chol/HDL Ratio: 5.5 ratio — ABNORMAL HIGH (ref 0.0–5.0)
Cholesterol, Total: 175 mg/dL (ref 100–199)
HDL: 32 mg/dL — ABNORMAL LOW (ref >39)
LDL Chol Calc (NIH): 112 mg/dL — ABNORMAL HIGH (ref 0–99)
Triglycerides: 172 mg/dL — ABNORMAL HIGH (ref 0–149)
VLDL Cholesterol Cal: 31 mg/dL (ref 5–40)

## 2020-05-25 DIAGNOSIS — R69 Illness, unspecified: Secondary | ICD-10-CM | POA: Diagnosis not present

## 2020-05-27 ENCOUNTER — Telehealth: Payer: Self-pay

## 2020-05-27 NOTE — Telephone Encounter (Signed)
Needs virtual visit 

## 2020-05-27 NOTE — Telephone Encounter (Signed)
Pt. Called stating that his wife has tested positive for Covid and he knows he has covid because he also doesn't feel good and has the same symptoms as her. He has a fever 101.6, coughing, SOB, he is taking Tylenol for the fever and some pills for coughing but they are not working any more coughing like crazy.

## 2020-05-27 NOTE — Telephone Encounter (Signed)
Pt. Scheduled for virtual tomorrow for covid

## 2020-05-28 ENCOUNTER — Other Ambulatory Visit: Payer: Self-pay

## 2020-05-28 ENCOUNTER — Telehealth: Payer: Self-pay | Admitting: Family Medicine

## 2020-05-28 ENCOUNTER — Telehealth (INDEPENDENT_AMBULATORY_CARE_PROVIDER_SITE_OTHER): Payer: Medicare HMO | Admitting: Medical

## 2020-05-28 ENCOUNTER — Encounter: Payer: Self-pay | Admitting: Medical

## 2020-05-28 VITALS — BP 127/70 | Temp 99.0°F | Ht 67.0 in | Wt 194.0 lb

## 2020-05-28 DIAGNOSIS — R05 Cough: Secondary | ICD-10-CM | POA: Diagnosis not present

## 2020-05-28 DIAGNOSIS — Z20822 Contact with and (suspected) exposure to covid-19: Secondary | ICD-10-CM

## 2020-05-28 DIAGNOSIS — R059 Cough, unspecified: Secondary | ICD-10-CM

## 2020-05-28 DIAGNOSIS — J209 Acute bronchitis, unspecified: Secondary | ICD-10-CM

## 2020-05-28 DIAGNOSIS — R0602 Shortness of breath: Secondary | ICD-10-CM

## 2020-05-28 MED ORDER — HYDROCODONE-HOMATROPINE 5-1.5 MG/5ML PO SYRP: 5.0000 mL | ORAL_SOLUTION | Freq: Three times a day (TID) | ORAL | 0 refills | Status: AC | PRN

## 2020-05-28 MED ORDER — EMERGEN-C IMMUNE PLUS PO PACK: 1.0000 | PACK | Freq: Two times a day (BID) | ORAL | 0 refills | Status: AC

## 2020-05-28 MED ORDER — ALBUTEROL SULFATE HFA 108 (90 BASE) MCG/ACT IN AERS: 2.0000 | INHALATION_SPRAY | Freq: Four times a day (QID) | RESPIRATORY_TRACT | 0 refills | Status: AC | PRN

## 2020-05-28 NOTE — Progress Notes (Signed)
Subjective:     Patient ID: Jim Clarke, male   DOB: [birth year], 79 y.o.   MRN: [id number]  This visit type was conducted due to national recommendations for restrictions regarding the COVID-19 Pandemic (e.g. social distancing) in an effort to limit this patient's exposure and mitigate transmission in our community.  Due to their co-morbid illnesses, this patient is at least at moderate risk for complications without adequate follow up.  This format is felt to be most appropriate for this patient at this time.    Documentation for virtual audio and video telecommunications through Zoom encounter:  The patient was located at home. The provider was located in the office. The patient did consent to this visit and is aware of possible charges through their insurance for this visit.  The other persons participating in this telemedicine service were none. Time spent on call was 20 minutes and in review of previous records 20 minutes total.  This virtual service is not related to other E/M service within previous 7 days.   HPI Chief Complaint  Patient presents with  . Follow-up    +COVID 8 DAYS AGO-  . Cough  . Fever    99 now- was higher at 101.7 a few days ago   Virtual consult today for possible Covid illness.  His wife was diagnosed 2 weeks ago with Covid.  She ended up getting an infusion therapy and is improving  He started having symptoms a week ago including coughing, headache, head feels like bust, fever abou 99,  high as 101.6, no appetite, lost 12 lb due to no appetite, body ache chills, loss of smell and taste 3 days ago, no vomit.  Some loose stool. Some times sob.  No cp , no palpations.  He is making himself drink Gatorade and water   he is using some cough medicine over-the-counter.  Not helping.  He has diabetes.  Blood sugars fasting have been in the low 100s, postprandial around 180 sugars.  No significant shortness of breath but he does feel some shortness of breath.  The  cough is his main concern right now as he can have coughing fits.  No other aggravating or relieving factors. No other complaint.  Past Medical History:  Diagnosis Date  . Arthritis   . Calcium oxalate renal stones   . Diabetes mellitus   . Dyslipidemia   . ED (erectile dysfunction)   . Former smoker    quit years ago as of 2013  . GERD (gastroesophageal reflux disease)   . Hypertension   . Superficial basal cell carcinoma 08/31/15   Current Outpatient Medications on File Prior to Visit  Medication Sig Dispense Refill  . aspirin 81 MG tablet Take 81 mg by mouth daily.      Marland Kitchen atorvastatin (LIPITOR) 20 MG tablet Take 1 tablet (20 mg total) by mouth daily. 90 tablet 2  . Blood Glucose Monitoring Suppl (ONE TOUCH ULTRA 2) w/Device KIT 1 each by Does not apply route daily. Pt testing twice daily 1 kit 0  . Blood Pressure Monitoring (SPHYGMOMANOMETER) MISC 1 Device by Does not apply route every morning. 1 each 0  . empagliflozin (JARDIANCE) 10 MG TABS tablet Take 10 mg by mouth daily. 90 tablet 0  . glipiZIDE (GLUCOTROL) 5 MG tablet Take 1 tablet (5 mg total) by mouth daily. (Patient taking differently: Take 5 mg by mouth 2 (two) times daily before a meal. ) 90 tablet 0  . glucose blood (ONE TOUCH ULTRA  TEST) test strip 1 each by Other route 2 (two) times daily. Use as instructed Patient is to test bid [id number] 300 each 1  . glucose blood test strip Use as instructed pt is to test two times daily 250.00 300 each prn  . Lancets (ONETOUCH ULTRASOFT) lancets Use as instructed 100 each 12  . lisinopril (ZESTRIL) 5 MG tablet Take 1 tablet (5 mg total) by mouth daily. 90 tablet 0  . metFORMIN (GLUCOPHAGE) 1000 MG tablet Take 1 tablet (1,000 mg total) by mouth 2 (two) times daily with a meal. 180 tablet 0  . budesonide-formoterol (SYMBICORT) 160-4.5 MCG/ACT inhaler Inhale 2 puffs into the lungs 2 (two) times daily for 1 day. (Patient not taking: Reported on 10/29/2018) 1 Inhaler 6  . mometasone  (NASONEX) 50 MCG/ACT nasal spray Place 2 sprays into the nose daily. (Patient not taking: Reported on 03/31/2020) 17 g 11  . Multiple Vitamins-Minerals (MULTIVITAMIN WITH MINERALS) tablet Take 1 tablet by mouth daily.   (Patient not taking: Reported on 05/28/2020)    . omeprazole (PRILOSEC) 20 MG capsule Take by mouth. (Patient not taking: Reported on 05/28/2020)     No current facility-administered medications on file prior to visit.     Review of Systems As in subjective    Objective:   Physical Exam Due to coronavirus pandemic stay at home measures, patient visit was virtual and they were not examined in person.   BP 127/70   Temp 99 F (37.2 C)   Ht '5\' 7"'$  (1.702 m)   Wt 194 lb (88 kg)   BMI 30.38 kg/m  He is talking in complete sentences.  He was coughing some but for most of the phone call he was, no obvious dyspnea     Assessment:     Encounter Diagnoses  Name Primary?  . Suspected COVID-19 virus infection Yes  . Cough   . Close exposure to COVID-19 virus   . SOB (shortness of breath)   . Acute bronchitis, unspecified organism        Plan:     We discussed symptoms, concern, likely Covid infection.  He has close exposure in the same household with his wife who had Covid 2 weeks ago and is still improving but symptomatic.   Medications below to help today including albuterol, cough syrup.  Discussed caution with sedation with the cough syrup.  Rest, increase hydration.  I asked him to stop his lisinopril the next 2 days in the event of dehydration.     We will send his information to the Covid infusion clinic although he is on day 7.  Ideally we would have started this 3 to 4 days ago  In the meantime if worse, worse chest pain or shortness of breath unable to keep anything down then go to the emergency department  I put in a chest x-ray order in the event he can go to [facility] outpatient hospital today  Joncarlo was seen today for follow-up, cough and  fever.  Diagnoses and all orders for this visit:  Suspected COVID-19 virus infection -     DG Chest 2 View; Future  Cough -     DG Chest 2 View; Future  Close exposure to COVID-19 virus -     DG Chest 2 View; Future  SOB (shortness of breath) -     DG Chest 2 View; Future  Acute bronchitis, unspecified organism -     albuterol (VENTOLIN HFA) 108 (90 Base) MCG/ACT inhaler; Inhale  2 puffs into the lungs every 6 (six) hours as needed for wheezing or shortness of breath.  Other orders -     HYDROcodone-homatropine (HYCODAN) 5-1.5 MG/5ML syrup; Take 5 mLs by mouth every 8 (eight) hours as needed for up to 5 days. -     Multiple Vitamins-Minerals (EMERGEN-C IMMUNE PLUS) PACK; Take 1 tablet by mouth 2 (two) times daily.  f/u prn

## 2020-05-28 NOTE — Telephone Encounter (Signed)
Wife states that the [facility] didn't have the narcotic medication that [facility] sent in today for treatment of his COVID symptoms, and couldn't be transferred to another pharmacy, due to it being a narcotic. Chart reviewed, med hycodan syrup.  Called [facility]--they double checked their stock, had been backordered, but just got some.  Hoyle Sauer notified that med is now available at the original pharmacy (had discussed possibly changing pharmacy to [facility] if [facility] didn't have).

## 2020-05-29 ENCOUNTER — Other Ambulatory Visit: Payer: Self-pay

## 2020-05-29 ENCOUNTER — Emergency Department (HOSPITAL_COMMUNITY): Payer: Medicare HMO

## 2020-05-29 ENCOUNTER — Encounter (HOSPITAL_COMMUNITY): Payer: Self-pay | Admitting: Emergency Medicine

## 2020-05-29 ENCOUNTER — Inpatient Hospital Stay (HOSPITAL_COMMUNITY)
Admission: EM | Admit: 2020-05-29 | Discharge: 2020-07-14 | DRG: 207 | Disposition: E | Payer: Medicare HMO | Attending: Pulmonary Disease | Admitting: Pulmonary Disease

## 2020-05-29 ENCOUNTER — Ambulatory Visit (HOSPITAL_COMMUNITY)
Admission: RE | Admit: 2020-05-29 | Discharge: 2020-05-29 | Disposition: A | Discharge status: Home / Self Care | Payer: Medicare HMO | Source: Ambulatory Visit | Attending: Pulmonary Disease | Admitting: Pulmonary Disease

## 2020-05-29 ENCOUNTER — Other Ambulatory Visit: Payer: Self-pay | Admitting: Physician Assistant

## 2020-05-29 DIAGNOSIS — R918 Other nonspecific abnormal finding of lung field: Secondary | ICD-10-CM | POA: Diagnosis not present

## 2020-05-29 DIAGNOSIS — E440 Moderate protein-calorie malnutrition: Secondary | ICD-10-CM | POA: Insufficient documentation

## 2020-05-29 DIAGNOSIS — U071 COVID-19: Secondary | ICD-10-CM

## 2020-05-29 DIAGNOSIS — R0902 Hypoxemia: Secondary | ICD-10-CM

## 2020-05-29 DIAGNOSIS — J0130 Acute sphenoidal sinusitis, unspecified: Secondary | ICD-10-CM | POA: Diagnosis not present

## 2020-05-29 DIAGNOSIS — A419 Sepsis, unspecified organism: Secondary | ICD-10-CM

## 2020-05-29 DIAGNOSIS — Z6831 Body mass index (BMI) 31.0-31.9, adult: Secondary | ICD-10-CM

## 2020-05-29 DIAGNOSIS — E872 Acidosis: Secondary | ICD-10-CM | POA: Diagnosis present

## 2020-05-29 DIAGNOSIS — R000 Tachycardia, unspecified: Secondary | ICD-10-CM | POA: Diagnosis not present

## 2020-05-29 DIAGNOSIS — J690 Pneumonitis due to inhalation of food and vomit: Secondary | ICD-10-CM | POA: Diagnosis not present

## 2020-05-29 DIAGNOSIS — E118 Type 2 diabetes mellitus with unspecified complications: Secondary | ICD-10-CM | POA: Diagnosis present

## 2020-05-29 DIAGNOSIS — J1282 Pneumonia due to coronavirus disease 2019: Secondary | ICD-10-CM | POA: Diagnosis present

## 2020-05-29 DIAGNOSIS — K219 Gastro-esophageal reflux disease without esophagitis: Secondary | ICD-10-CM | POA: Diagnosis not present

## 2020-05-29 DIAGNOSIS — E1169 Type 2 diabetes mellitus with other specified complication: Secondary | ICD-10-CM | POA: Diagnosis present

## 2020-05-29 DIAGNOSIS — E1165 Type 2 diabetes mellitus with hyperglycemia: Secondary | ICD-10-CM | POA: Diagnosis not present

## 2020-05-29 DIAGNOSIS — A0472 Enterocolitis due to Clostridium difficile, not specified as recurrent: Secondary | ICD-10-CM

## 2020-05-29 DIAGNOSIS — R34 Anuria and oliguria: Secondary | ICD-10-CM | POA: Diagnosis not present

## 2020-05-29 DIAGNOSIS — J069 Acute upper respiratory infection, unspecified: Secondary | ICD-10-CM | POA: Diagnosis not present

## 2020-05-29 DIAGNOSIS — E876 Hypokalemia: Secondary | ICD-10-CM | POA: Diagnosis not present

## 2020-05-29 DIAGNOSIS — D72810 Lymphocytopenia: Secondary | ICD-10-CM | POA: Diagnosis not present

## 2020-05-29 DIAGNOSIS — Z8261 Family history of arthritis: Secondary | ICD-10-CM

## 2020-05-29 DIAGNOSIS — I152 Hypertension secondary to endocrine disorders: Secondary | ICD-10-CM

## 2020-05-29 DIAGNOSIS — A4189 Other specified sepsis: Secondary | ICD-10-CM | POA: Diagnosis not present

## 2020-05-29 DIAGNOSIS — J811 Chronic pulmonary edema: Secondary | ICD-10-CM | POA: Diagnosis not present

## 2020-05-29 DIAGNOSIS — J80 Acute respiratory distress syndrome: Secondary | ICD-10-CM | POA: Diagnosis not present

## 2020-05-29 DIAGNOSIS — Z818 Family history of other mental and behavioral disorders: Secondary | ICD-10-CM

## 2020-05-29 DIAGNOSIS — E875 Hyperkalemia: Secondary | ICD-10-CM | POA: Diagnosis not present

## 2020-05-29 DIAGNOSIS — N179 Acute kidney failure, unspecified: Secondary | ICD-10-CM | POA: Diagnosis present

## 2020-05-29 DIAGNOSIS — I472 Ventricular tachycardia: Secondary | ICD-10-CM | POA: Diagnosis not present

## 2020-05-29 DIAGNOSIS — G9341 Metabolic encephalopathy: Secondary | ICD-10-CM | POA: Diagnosis not present

## 2020-05-29 DIAGNOSIS — Z978 Presence of other specified devices: Secondary | ICD-10-CM

## 2020-05-29 DIAGNOSIS — Z789 Other specified health status: Secondary | ICD-10-CM

## 2020-05-29 DIAGNOSIS — G9340 Encephalopathy, unspecified: Secondary | ICD-10-CM | POA: Diagnosis not present

## 2020-05-29 DIAGNOSIS — J156 Pneumonia due to other aerobic Gram-negative bacteria: Secondary | ICD-10-CM | POA: Diagnosis not present

## 2020-05-29 DIAGNOSIS — R509 Fever, unspecified: Secondary | ICD-10-CM | POA: Diagnosis not present

## 2020-05-29 DIAGNOSIS — I10 Essential (primary) hypertension: Secondary | ICD-10-CM | POA: Diagnosis not present

## 2020-05-29 DIAGNOSIS — Z9842 Cataract extraction status, left eye: Secondary | ICD-10-CM

## 2020-05-29 DIAGNOSIS — Z6830 Body mass index (BMI) 30.0-30.9, adult: Secondary | ICD-10-CM | POA: Diagnosis not present

## 2020-05-29 DIAGNOSIS — D6959 Other secondary thrombocytopenia: Secondary | ICD-10-CM | POA: Diagnosis not present

## 2020-05-29 DIAGNOSIS — Z4682 Encounter for fitting and adjustment of non-vascular catheter: Secondary | ICD-10-CM | POA: Diagnosis not present

## 2020-05-29 DIAGNOSIS — Y95 Nosocomial condition: Secondary | ICD-10-CM | POA: Diagnosis not present

## 2020-05-29 DIAGNOSIS — Z833 Family history of diabetes mellitus: Secondary | ICD-10-CM

## 2020-05-29 DIAGNOSIS — R579 Shock, unspecified: Secondary | ICD-10-CM | POA: Diagnosis not present

## 2020-05-29 DIAGNOSIS — Z515 Encounter for palliative care: Secondary | ICD-10-CM | POA: Diagnosis not present

## 2020-05-29 DIAGNOSIS — E1159 Type 2 diabetes mellitus with other circulatory complications: Secondary | ICD-10-CM

## 2020-05-29 DIAGNOSIS — M1990 Unspecified osteoarthritis, unspecified site: Secondary | ICD-10-CM | POA: Diagnosis present

## 2020-05-29 DIAGNOSIS — Z87891 Personal history of nicotine dependence: Secondary | ICD-10-CM | POA: Diagnosis not present

## 2020-05-29 DIAGNOSIS — R4182 Altered mental status, unspecified: Secondary | ICD-10-CM | POA: Diagnosis not present

## 2020-05-29 DIAGNOSIS — Z7982 Long term (current) use of aspirin: Secondary | ICD-10-CM

## 2020-05-29 DIAGNOSIS — R6521 Severe sepsis with septic shock: Secondary | ICD-10-CM | POA: Diagnosis not present

## 2020-05-29 DIAGNOSIS — D649 Anemia, unspecified: Secondary | ICD-10-CM | POA: Diagnosis not present

## 2020-05-29 DIAGNOSIS — E785 Hyperlipidemia, unspecified: Secondary | ICD-10-CM | POA: Diagnosis present

## 2020-05-29 DIAGNOSIS — J9811 Atelectasis: Secondary | ICD-10-CM | POA: Diagnosis not present

## 2020-05-29 DIAGNOSIS — Z7984 Long term (current) use of oral hypoglycemic drugs: Secondary | ICD-10-CM

## 2020-05-29 DIAGNOSIS — J0100 Acute maxillary sinusitis, unspecified: Secondary | ICD-10-CM | POA: Diagnosis not present

## 2020-05-29 DIAGNOSIS — Z85820 Personal history of malignant melanoma of skin: Secondary | ICD-10-CM

## 2020-05-29 DIAGNOSIS — R7989 Other specified abnormal findings of blood chemistry: Secondary | ICD-10-CM | POA: Diagnosis not present

## 2020-05-29 DIAGNOSIS — Z781 Physical restraint status: Secondary | ICD-10-CM

## 2020-05-29 DIAGNOSIS — Z882 Allergy status to sulfonamides status: Secondary | ICD-10-CM

## 2020-05-29 DIAGNOSIS — Y92239 Unspecified place in hospital as the place of occurrence of the external cause: Secondary | ICD-10-CM | POA: Diagnosis not present

## 2020-05-29 DIAGNOSIS — J189 Pneumonia, unspecified organism: Secondary | ICD-10-CM | POA: Diagnosis not present

## 2020-05-29 DIAGNOSIS — Z9911 Dependence on respirator [ventilator] status: Secondary | ICD-10-CM | POA: Diagnosis not present

## 2020-05-29 DIAGNOSIS — J90 Pleural effusion, not elsewhere classified: Secondary | ICD-10-CM | POA: Diagnosis not present

## 2020-05-29 DIAGNOSIS — E870 Hyperosmolality and hypernatremia: Secondary | ICD-10-CM | POA: Diagnosis not present

## 2020-05-29 DIAGNOSIS — J9601 Acute respiratory failure with hypoxia: Secondary | ICD-10-CM | POA: Diagnosis present

## 2020-05-29 DIAGNOSIS — Z66 Do not resuscitate: Secondary | ICD-10-CM | POA: Diagnosis not present

## 2020-05-29 DIAGNOSIS — J9602 Acute respiratory failure with hypercapnia: Secondary | ICD-10-CM

## 2020-05-29 DIAGNOSIS — Z4659 Encounter for fitting and adjustment of other gastrointestinal appliance and device: Secondary | ICD-10-CM

## 2020-05-29 DIAGNOSIS — Z79899 Other long term (current) drug therapy: Secondary | ICD-10-CM

## 2020-05-29 DIAGNOSIS — Z452 Encounter for adjustment and management of vascular access device: Secondary | ICD-10-CM

## 2020-05-29 DIAGNOSIS — J0140 Acute pansinusitis, unspecified: Secondary | ICD-10-CM | POA: Diagnosis not present

## 2020-05-29 DIAGNOSIS — Z0184 Encounter for antibody response examination: Secondary | ICD-10-CM | POA: Diagnosis not present

## 2020-05-29 DIAGNOSIS — R578 Other shock: Secondary | ICD-10-CM | POA: Diagnosis not present

## 2020-05-29 DIAGNOSIS — M4184 Other forms of scoliosis, thoracic region: Secondary | ICD-10-CM | POA: Diagnosis not present

## 2020-05-29 DIAGNOSIS — R001 Bradycardia, unspecified: Secondary | ICD-10-CM | POA: Diagnosis not present

## 2020-05-29 DIAGNOSIS — J9600 Acute respiratory failure, unspecified whether with hypoxia or hypercapnia: Secondary | ICD-10-CM

## 2020-05-29 DIAGNOSIS — Z7951 Long term (current) use of inhaled steroids: Secondary | ICD-10-CM

## 2020-05-29 DIAGNOSIS — M47814 Spondylosis without myelopathy or radiculopathy, thoracic region: Secondary | ICD-10-CM | POA: Diagnosis not present

## 2020-05-29 DIAGNOSIS — Z961 Presence of intraocular lens: Secondary | ICD-10-CM | POA: Diagnosis present

## 2020-05-29 DIAGNOSIS — E871 Hypo-osmolality and hyponatremia: Secondary | ICD-10-CM | POA: Diagnosis not present

## 2020-05-29 DIAGNOSIS — E669 Obesity, unspecified: Secondary | ICD-10-CM | POA: Diagnosis present

## 2020-05-29 DIAGNOSIS — Z9841 Cataract extraction status, right eye: Secondary | ICD-10-CM

## 2020-05-29 DIAGNOSIS — J9690 Respiratory failure, unspecified, unspecified whether with hypoxia or hypercapnia: Secondary | ICD-10-CM | POA: Diagnosis not present

## 2020-05-29 DIAGNOSIS — G319 Degenerative disease of nervous system, unspecified: Secondary | ICD-10-CM | POA: Diagnosis not present

## 2020-05-29 DIAGNOSIS — E878 Other disorders of electrolyte and fluid balance, not elsewhere classified: Secondary | ICD-10-CM | POA: Diagnosis not present

## 2020-05-29 DIAGNOSIS — T380X5A Adverse effect of glucocorticoids and synthetic analogues, initial encounter: Secondary | ICD-10-CM | POA: Diagnosis not present

## 2020-05-29 DIAGNOSIS — K5900 Constipation, unspecified: Secondary | ICD-10-CM | POA: Diagnosis not present

## 2020-05-29 DIAGNOSIS — E781 Pure hyperglyceridemia: Secondary | ICD-10-CM | POA: Diagnosis not present

## 2020-05-29 LAB — CBC WITH DIFFERENTIAL/PLATELET
Abs Immature Granulocytes: 0.04 10*3/uL (ref 0.00–0.07)
Basophils Absolute: 0 10*3/uL (ref 0.0–0.1)
Basophils Relative: 0 %
Eosinophils Absolute: 0 10*3/uL (ref 0.0–0.5)
Eosinophils Relative: 0 %
HCT: 42.8 % (ref 39.0–52.0)
Hemoglobin: 14 g/dL (ref 13.0–17.0)
Immature Granulocytes: 1 %
Lymphocytes Relative: 5 %
Lymphs Abs: 0.4 10*3/uL — ABNORMAL LOW (ref 0.7–4.0)
MCH: 31.8 pg (ref 26.0–34.0)
MCHC: 32.7 g/dL (ref 30.0–36.0)
MCV: 97.3 fL (ref 80.0–100.0)
Monocytes Absolute: 0.4 10*3/uL (ref 0.1–1.0)
Monocytes Relative: 5 %
Neutro Abs: 7 10*3/uL (ref 1.7–7.7)
Neutrophils Relative %: 89 %
Platelets: 163 10*3/uL (ref 150–400)
RBC: 4.4 MIL/uL (ref 4.22–5.81)
RDW: 13.6 % (ref 11.5–15.5)
WBC: 7.8 10*3/uL (ref 4.0–10.5)
nRBC: 0 % (ref 0.0–0.2)

## 2020-05-29 LAB — ABO/RH: ABO/RH(D): O POS

## 2020-05-29 LAB — COMPREHENSIVE METABOLIC PANEL
ALT: 28 U/L (ref 0–44)
AST: 43 U/L — ABNORMAL HIGH (ref 15–41)
Albumin: 3.2 g/dL — ABNORMAL LOW (ref 3.5–5.0)
Alkaline Phosphatase: 46 U/L (ref 38–126)
Anion gap: 17 — ABNORMAL HIGH (ref 5–15)
BUN: 33 mg/dL — ABNORMAL HIGH (ref 8–23)
CO2: 23 mmol/L (ref 22–32)
Calcium: 8.3 mg/dL — ABNORMAL LOW (ref 8.9–10.3)
Chloride: 99 mmol/L (ref 98–111)
Creatinine, Ser: 1.71 mg/dL — ABNORMAL HIGH (ref 0.61–1.24)
GFR calc Af Amer: 43 mL/min — ABNORMAL LOW (ref >60)
GFR calc non Af Amer: 38 mL/min — ABNORMAL LOW (ref >60)
Glucose, Bld: 225 mg/dL — ABNORMAL HIGH (ref 70–99)
Potassium: 5 mmol/L (ref 3.5–5.1)
Sodium: 139 mmol/L (ref 135–145)
Total Bilirubin: 0.9 mg/dL (ref 0.3–1.2)
Total Protein: 7.3 g/dL (ref 6.5–8.1)

## 2020-05-29 LAB — LACTIC ACID, PLASMA: Lactic Acid, Venous: 1.4 mmol/L (ref 0.5–1.9)

## 2020-05-29 LAB — MRSA PCR SCREENING: MRSA by PCR: NEGATIVE

## 2020-05-29 LAB — PROCALCITONIN: Procalcitonin: 0.46 ng/mL

## 2020-05-29 LAB — GLUCOSE, CAPILLARY
Glucose-Capillary: 249 mg/dL — ABNORMAL HIGH (ref 70–99)
Glucose-Capillary: 276 mg/dL — ABNORMAL HIGH (ref 70–99)

## 2020-05-29 LAB — D-DIMER, QUANTITATIVE: D-Dimer, Quant: 1.04 ug/mL-FEU — ABNORMAL HIGH (ref 0.00–0.50)

## 2020-05-29 LAB — LACTATE DEHYDROGENASE: LDH: 351 U/L — ABNORMAL HIGH (ref 98–192)

## 2020-05-29 LAB — SARS CORONAVIRUS 2 BY RT PCR (HOSPITAL ORDER, PERFORMED IN ~~LOC~~ HOSPITAL LAB): SARS Coronavirus 2: POSITIVE — AB

## 2020-05-29 LAB — C-REACTIVE PROTEIN: CRP: 26.5 mg/dL — ABNORMAL HIGH (ref <1.0)

## 2020-05-29 LAB — FIBRINOGEN: Fibrinogen: 773 mg/dL — ABNORMAL HIGH (ref 210–475)

## 2020-05-29 LAB — TRIGLYCERIDES: Triglycerides: 198 mg/dL — ABNORMAL HIGH (ref <150)

## 2020-05-29 LAB — FERRITIN: Ferritin: 339 ng/mL — ABNORMAL HIGH (ref 24–336)

## 2020-05-29 MED ORDER — SODIUM CHLORIDE 0.9 % IV SOLN
Freq: Once | INTRAVENOUS | Status: DC
  Filled 2020-05-29: qty 5

## 2020-05-29 MED ORDER — SODIUM CHLORIDE 0.9 % IV SOLN
100.0000 mg | INTRAVENOUS | Status: AC
  Administered 2020-05-29 (×2): 100 mg via INTRAVENOUS
  Filled 2020-05-29: qty 20

## 2020-05-29 MED ORDER — LISINOPRIL 10 MG PO TABS
5.0000 mg | ORAL_TABLET | Freq: Every day | ORAL | Status: DC
  Administered 2020-05-29: 5 mg via ORAL
  Filled 2020-05-29: qty 1

## 2020-05-29 MED ORDER — ATORVASTATIN CALCIUM 10 MG PO TABS
20.0000 mg | ORAL_TABLET | Freq: Every day | ORAL | Status: DC
  Administered 2020-05-29 – 2020-06-03 (×6): 20 mg via ORAL
  Filled 2020-05-29 (×6): qty 2

## 2020-05-29 MED ORDER — ALBUTEROL SULFATE HFA 108 (90 BASE) MCG/ACT IN AERS
2.0000 | INHALATION_SPRAY | Freq: Four times a day (QID) | RESPIRATORY_TRACT | Status: DC
  Administered 2020-05-29 – 2020-06-04 (×23): 2 via RESPIRATORY_TRACT
  Filled 2020-05-29: qty 6.7

## 2020-05-29 MED ORDER — DEXAMETHASONE SODIUM PHOSPHATE 10 MG/ML IJ SOLN
6.0000 mg | INTRAMUSCULAR | Status: DC
  Administered 2020-05-30: 6 mg via INTRAVENOUS
  Filled 2020-05-29: qty 1

## 2020-05-29 MED ORDER — ACETAMINOPHEN 650 MG RE SUPP: 650.0000 mg | Freq: Four times a day (QID) | RECTAL | Status: DC | PRN

## 2020-05-29 MED ORDER — ENSURE ENLIVE PO LIQD
237.0000 mL | Freq: Two times a day (BID) | ORAL | Status: DC
  Administered 2020-05-30 – 2020-06-03 (×6): 237 mL via ORAL

## 2020-05-29 MED ORDER — TOCILIZUMAB 400 MG/20ML IV SOLN
700.0000 mg | Freq: Once | INTRAVENOUS | Status: AC
  Administered 2020-05-29: 700 mg via INTRAVENOUS
  Filled 2020-05-29: qty 35

## 2020-05-29 MED ORDER — ASCORBIC ACID 500 MG PO TABS
500.0000 mg | ORAL_TABLET | Freq: Every day | ORAL | Status: DC
  Administered 2020-05-29 – 2020-06-03 (×6): 500 mg via ORAL
  Filled 2020-05-29 (×6): qty 1

## 2020-05-29 MED ORDER — ACETAMINOPHEN 325 MG PO TABS: 650.0000 mg | ORAL_TABLET | Freq: Four times a day (QID) | ORAL | Status: DC | PRN

## 2020-05-29 MED ORDER — ENOXAPARIN SODIUM 40 MG/0.4ML ~~LOC~~ SOLN
40.0000 mg | SUBCUTANEOUS | Status: DC
  Administered 2020-05-29 – 2020-06-02 (×5): 40 mg via SUBCUTANEOUS
  Filled 2020-05-29 (×5): qty 0.4

## 2020-05-29 MED ORDER — INSULIN ASPART 100 UNIT/ML ~~LOC~~ SOLN
0.0000 [IU] | Freq: Every day | SUBCUTANEOUS | Status: DC
  Administered 2020-05-29: 3 [IU] via SUBCUTANEOUS
  Filled 2020-05-29: qty 0.05

## 2020-05-29 MED ORDER — CHLORHEXIDINE GLUCONATE CLOTH 2 % EX PADS
6.0000 | MEDICATED_PAD | Freq: Every day | CUTANEOUS | Status: DC
  Administered 2020-05-29 – 2020-06-21 (×26): 6 via TOPICAL

## 2020-05-29 MED ORDER — INSULIN ASPART 100 UNIT/ML ~~LOC~~ SOLN
0.0000 [IU] | Freq: Three times a day (TID) | SUBCUTANEOUS | Status: DC
  Administered 2020-05-29: 5 [IU] via SUBCUTANEOUS
  Administered 2020-05-30: 8 [IU] via SUBCUTANEOUS
  Administered 2020-05-30 – 2020-05-31 (×3): 15 [IU] via SUBCUTANEOUS
  Filled 2020-05-29: qty 0.15

## 2020-05-29 MED ORDER — HYDROCOD POLST-CPM POLST ER 10-8 MG/5ML PO SUER
5.0000 mL | Freq: Two times a day (BID) | ORAL | Status: DC | PRN
  Administered 2020-05-30 – 2020-06-01 (×5): 5 mL via ORAL
  Filled 2020-05-29 (×6): qty 5

## 2020-05-29 MED ORDER — DEXAMETHASONE SODIUM PHOSPHATE 10 MG/ML IJ SOLN
10.0000 mg | Freq: Once | INTRAMUSCULAR | Status: AC
  Administered 2020-05-29: 10 mg via INTRAVENOUS
  Filled 2020-05-29: qty 1

## 2020-05-29 MED ORDER — GUAIFENESIN-DM 100-10 MG/5ML PO SYRP
10.0000 mL | ORAL_SOLUTION | ORAL | Status: DC | PRN
  Administered 2020-05-30 – 2020-06-01 (×3): 10 mL via ORAL
  Filled 2020-05-29 (×5): qty 10

## 2020-05-29 MED ORDER — ZINC SULFATE 220 (50 ZN) MG PO CAPS
220.0000 mg | ORAL_CAPSULE | Freq: Every day | ORAL | Status: DC
  Administered 2020-05-29 – 2020-06-03 (×6): 220 mg via ORAL
  Filled 2020-05-29 (×6): qty 1

## 2020-05-29 MED ORDER — ORAL CARE MOUTH RINSE
15.0000 mL | Freq: Two times a day (BID) | OROMUCOSAL | Status: DC
  Administered 2020-05-29 – 2020-06-06 (×13): 15 mL via OROMUCOSAL

## 2020-05-29 MED ORDER — PANTOPRAZOLE SODIUM 40 MG PO TBEC
40.0000 mg | DELAYED_RELEASE_TABLET | Freq: Every day | ORAL | Status: DC
  Administered 2020-05-29 – 2020-06-03 (×6): 40 mg via ORAL
  Filled 2020-05-29 (×6): qty 1

## 2020-05-29 MED ORDER — SODIUM CHLORIDE 0.9 % IV SOLN: Freq: Once | INTRAVENOUS | Status: DC

## 2020-05-29 MED ORDER — SODIUM CHLORIDE 0.9 % IV SOLN
100.0000 mg | Freq: Every day | INTRAVENOUS | Status: AC
  Administered 2020-05-30 – 2020-06-02 (×4): 100 mg via INTRAVENOUS
  Filled 2020-05-29 (×4): qty 20

## 2020-05-29 NOTE — ED Notes (Signed)
Respiratory at bedside.

## 2020-05-29 NOTE — H&P (Addendum)
History and Physical    MARX DOIG ZOX:[id number] DOB: Feb 24, 1941 DOA: 05/28/2020  PCP: Denita Lung, MD  Patient coming from: Home - sent from monoclonal ab infusion clinic  Chief Complaint: SOB, cough  HPI: Jim Clarke is a 79 y.o. male with medical history significant of DM2 on oral hyperglycemic meds, former tobacco abuse, obesity who presents from monoclonal ab infusion clinic with marked hypoxia and sob. Pt vaccinated in 5/21 and has been exposed to unvaccinated wife who is currently recovering from [place] herself. Pt reportedly called PCP on 7/15 for generalized malaise, fevers, coughing, sob and was subsequently referred for outpatient monoclonal ab infusion scheduled for 7/17. While at infusion center, patient noted to have o2 sats into the 70% requiring up 6L on initial presentation. Pt currently complains of some sob, no chest pain, no abd discomfort  ED Course: In the ED, pt initially required [facility], gradually increased to 15L high flow to maintain sats. LDH 351, ferritin 339, CRP 26.5 with ddimer of 1.0. COVID test later returned positive. CXR positive for B infiltrates. Pt was started on IV decadron and remdisivir. EDP did discuss case with PCCM. Hospitalist consulted for consideration for admission.  Review of Systems:  Review of Systems  Constitutional: Positive for fever. Negative for diaphoresis and weight loss.  HENT: Negative for ear discharge, ear pain and nosebleeds.   Eyes: Negative for double vision, photophobia and pain.  Respiratory: Positive for cough and shortness of breath.   Cardiovascular: Negative for chest pain, orthopnea, claudication and leg swelling.  Gastrointestinal: Negative for abdominal pain and constipation.  Genitourinary: Negative for frequency, hematuria and urgency.  Musculoskeletal: Negative for back pain and neck pain.  Neurological: Negative for tingling, tremors, seizures and loss of consciousness.  Psychiatric/Behavioral: Negative for  hallucinations and memory loss. The patient is not nervous/anxious.     Past Medical History:  Diagnosis Date  . Arthritis   . Calcium oxalate renal stones   . Diabetes mellitus   . Dyslipidemia   . ED (erectile dysfunction)   . Former smoker    quit years ago as of 2013  . GERD (gastroesophageal reflux disease)   . Hypertension   . Superficial basal cell carcinoma 08/31/15    Past Surgical History:  Procedure Laterality Date  . APPENDECTOMY    . CATARACT EXTRACTION W/ INTRAOCULAR LENS  IMPLANT, BILATERAL Bilateral 2016  . COLONOSCOPY  2004,2009,2011  . KNEE ARTHROSCOPY     RIGHT  Eulas Post MD)  . SKIN BIOPSY Right 09/19/2018   epidermoid cyst ( see report in chart )  . UPPER GASTROINTESTINAL ENDOSCOPY     schatzki ring,  LA Grade A reflux esophagitis     reports that he quit smoking about 22 years ago. He has a 105.00 pack-year smoking history. He has never used smokeless tobacco. He reports current alcohol use of about 1.0 standard drink of alcohol per week. He reports that he does not use drugs.  Allergies  Allergen Reactions  . Sulfa Antibiotics     Family History  Problem Relation Age of Onset  . Arthritis Mother   . Mental illness Mother   . Seizures Mother   . Arthritis Father   . Cancer Father   . Diabetes Father     Prior to Admission medications   Medication Sig Start Date End Date Taking? Authorizing Provider  albuterol (VENTOLIN HFA) 108 (90 Base) MCG/ACT inhaler Inhale 2 puffs into the lungs every 6 (six) hours as needed for wheezing  or shortness of breath. 05/28/20  Yes Tysinger, Camelia Eng, PA-C  aspirin 81 MG tablet Take 81 mg by mouth at bedtime.    Yes [provider]  atorvastatin (LIPITOR) 20 MG tablet Take 1 tablet (20 mg total) by mouth daily. Patient taking differently: Take 20 mg by mouth at bedtime.  02/06/17  Yes Denita Lung, MD  Blood Glucose Monitoring Suppl (ONE TOUCH ULTRA 2) w/Device KIT 1 each by Does not apply route daily. Pt  testing twice daily 09/30/19  Yes Denita Lung, MD  Blood Pressure Monitoring [facility]) MISC 1 Device by Does not apply route every morning. 05/28/12  Yes Upstill, Nehemiah Settle, PA-C  budesonide-formoterol (SYMBICORT) 160-4.5 MCG/ACT inhaler Inhale 2 puffs into the lungs 2 (two) times daily for 1 day. 09/09/18 06/11/2020 Yes Mannam, Praveen, MD  empagliflozin (JARDIANCE) 10 MG TABS tablet Take 10 mg by mouth daily. 01/29/20  Yes Denita Lung, MD  glipiZIDE (GLUCOTROL) 10 MG tablet Take 10 mg by mouth 2 (two) times daily. 04/02/20  Yes [provider]  glucose blood (ONE TOUCH ULTRA TEST) test strip 1 each by Other route 2 (two) times daily. Use as instructed Patient is to test bid DX:[id number] 11/25/15  Yes Denita Lung, MD  glucose blood test strip Use as instructed pt is to test two times daily 250.00 08/22/12  Yes Denita Lung, MD  HYDROcodone-homatropine [facility]) 5-1.5 MG/5ML syrup Take 5 mLs by mouth every 8 (eight) hours as needed for up to 5 days. 05/28/20 06/02/20 Yes Tysinger, Camelia Eng, PA-C  Lancets [facility] ULTRASOFT) lancets Use as instructed 11/25/15  Yes Denita Lung, MD  lisinopril (ZESTRIL) 5 MG tablet Take 1 tablet (5 mg total) by mouth daily. Patient taking differently: Take 5 mg by mouth at bedtime.  01/29/20  Yes Denita Lung, MD  metFORMIN (GLUCOPHAGE) 1000 MG tablet Take 1 tablet (1,000 mg total) by mouth 2 (two) times daily with a meal. 01/02/20  Yes Denita Lung, MD  Multiple Vitamins-Minerals (EMERGEN-C IMMUNE PLUS) PACK Take 1 tablet by mouth 2 (two) times daily. 05/28/20  Yes Tysinger, Camelia Eng, PA-C  omeprazole (PRILOSEC) 20 MG capsule Take 20 mg by mouth every other day.    Yes [provider]  mometasone (NASONEX) 50 MCG/ACT nasal spray Place 2 sprays into the nose daily. Patient not taking: Reported on 03/31/2020 10/29/18   Marshell Garfinkel, MD    Physical Exam: Vitals:   06/11/2020 1345 05/23/2020 1417 05/16/2020 1417 05/19/2020 1431  BP: 122/78  121/71  130/69  Pulse: 100 88  86  Resp: (!) 24 (!) 24  (!) 29  Temp:  99.1 F (37.3 C) 98.5 F (36.9 C)   TempSrc:  Oral Oral   SpO2: 92% 93%  93%    Constitutional: NAD, calm, comfortable Vitals:   05/30/2020 1345 05/23/2020 1417 05/22/2020 1417 05/27/2020 1431  BP: 122/78 121/71  130/69  Pulse: 100 88  86  Resp: (!) 24 (!) 24  (!) 29  Temp:  99.1 F (37.3 C) 98.5 F (36.9 C)   TempSrc:  Oral Oral   SpO2: 92% 93%  93%   Eyes: PERRL, lids and conjunctivae normal ENMT: Mucous membranes are moist. Posterior pharynx clear of any exudate or lesions.Normal dentition.  Neck: normal, supple, no masses, no thyromegaly Respiratory: clear to auscultation bilaterally, no audible wheezing normal resp effort Cardiovascular: Regular rate and rhythm,s1, s2 Abdomen: no tenderness, nondistended Musculoskeletal: no clubbing / cyanosis. No joint deformity upper and lower  extremities. Good ROM, no contractures. Normal muscle tone.  Skin: no rashes, lesions, ulcers. No induration Neurologic: CN 2-12 grossly intact. Sensation intact, strength 5/5 in all 4.  Psychiatric: Normal judgment and insight. Alert and oriented x 3. Normal mood.    Labs on Admission: I have personally reviewed following labs and imaging studies  CBC: Recent Labs  Lab 05/21/2020 1259  WBC 7.8  NEUTROABS 7.0  HGB 14.0  HCT 42.8  MCV 97.3  PLT 419   Basic Metabolic Panel: Recent Labs  Lab 06/02/2020 1259  NA 139  K 5.0  CL 99  CO2 23  GLUCOSE 225*  BUN 33*  CREATININE 1.71*  CALCIUM 8.3*   GFR: Estimated Creatinine Clearance: 37.7 mL/min (A) (by C-G formula based on SCr of 1.71 mg/dL (H)). Liver Function Tests: Recent Labs  Lab 05/16/2020 1259  AST 43*  ALT 28  ALKPHOS 46  BILITOT 0.9  PROT 7.3  ALBUMIN 3.2*   No results for input(s): LIPASE, AMYLASE in the last 168 hours. No results for input(s): AMMONIA in the last 168 hours. Coagulation Profile: No results for input(s): INR, PROTIME in the last 168  hours. Cardiac Enzymes: No results for input(s): CKTOTAL, CKMB, CKMBINDEX, TROPONINI in the last 168 hours. BNP (last 3 results) No results for input(s): PROBNP in the last 8760 hours. HbA1C: No results for input(s): HGBA1C in the last 72 hours. CBG: No results for input(s): GLUCAP in the last 168 hours. Lipid Profile: Recent Labs    06/07/2020 1259  TRIG 198*   Thyroid Function Tests: No results for input(s): TSH, T4TOTAL, FREET4, T3FREE, THYROIDAB in the last 72 hours. Anemia Panel: Recent Labs    05/18/2020 1259  FERRITIN 339*   Urine analysis:    Component Value Date/Time   COLORURINE YELLOW 05/28/2012 1055   APPEARANCEUR CLEAR 05/28/2012 1055   LABSPEC 1.018 05/28/2012 1055   PHURINE 5.5 05/28/2012 1055   GLUCOSEU NEGATIVE 05/28/2012 1055   HGBUR NEGATIVE 05/28/2012 1055   BILIRUBINUR NEGATIVE 05/28/2012 1055   KETONESUR 15 (A) 05/28/2012 1055   PROTEINUR NEGATIVE 05/28/2012 1055   UROBILINOGEN 0.2 05/28/2012 1055   NITRITE NEGATIVE 05/28/2012 1055   LEUKOCYTESUR NEGATIVE 05/28/2012 1055   Sepsis Labs: !!!!!!!!!!!!!!!!!!!!!!!!!!!!!!!!!!!!!!!!!!!! '@LABRCNTIP'$ (procalcitonin:4,lacticidven:4) ) Recent Results (from the past 240 hour(s))  SARS Coronavirus 2 by RT PCR (hospital order, performed in [place] hospital lab) Nasopharyngeal Nasopharyngeal Swab     Status: Abnormal   Collection Time: 05/13/2020 12:59 PM   Specimen: Nasopharyngeal Swab  Result Value Ref Range Status   SARS Coronavirus 2 POSITIVE (A) NEGATIVE Final    Comment: RESULT CALLED TO, READ BACK BY AND VERIFIED WITH: Samul Dada RN [id number] 05/22/2020 JM (NOTE) SARS-CoV-2 target nucleic acids are DETECTED  SARS-CoV-2 RNA is generally detectable in upper respiratory specimens  during the acute phase of infection.  Positive results are indicative  of the presence of the identified virus, but do not rule out bacterial infection or co-infection with other pathogens not detected by the test.  Clinical  correlation with patient history and  other diagnostic information is necessary to determine patient infection status.  The expected result is negative.  Fact Sheet for Patients:   StrictlyIdeas.no   Fact Sheet for Healthcare Providers:   BankingDealers.co.za    This test is not yet approved or cleared by the [place] FDA and  has been authorized for detection and/or diagnosis of SARS-CoV-2 by FDA under an Emergency Use Authorization (EUA).  This EUA will remain in effect (  meaning this test can  be used) for the duration of  the COVID-19 declaration under Section 564(b)(1) of the Act, 21 U.S.C. section 360-bbb-3(b)(1), unless the authorization is terminated or revoked sooner.  Performed at [facility], [place] [address] [place], [place] [address]      Radiological Exams on Admission: DG Chest Port 1 View  Result Date: 05/21/2020 CLINICAL DATA:  COVID-19 positive. Hypoxia. EXAM: PORTABLE CHEST 1 VIEW COMPARISON:  February 25, 2018 FINDINGS: Cardiomediastinal silhouette is normal. Mediastinal contours appear intact. There is no evidence of pleural effusion or pneumothorax. Bilateral patchy airspace consolidation with lower lobe predominance, worse in the left lower lung field, consistent with multifocal pneumonia. Osseous structures are without acute abnormality. Soft tissues are grossly normal. IMPRESSION: Bilateral patchy airspace consolidation with lower lobe predominance, worse in the left lower lung field, consistent with multifocal pneumonia. This may represent a viral pneumonia secondary to COVID-19 infection, however superimposed lobar pneumonia in the left lower lung field cannot be excluded. Electronically Signed   By: Fidela Salisbury M.D.   On: 05/25/2020 13:09    EKG: Independently reviewed. Sinus tach  Assessment/Plan Principal Problem:   Pneumonia due to COVID-19 virus Active Problems:   Hypertension  associated with diabetes ([place])   Type 2 diabetes with complication (HCC)   Gastroesophageal reflux disease without esophagitis   Acute hypoxemic respiratory failure (HCC)   1. COVID-19 PNA with acute hypoxemic resp failure 1. Pt reportedly vaccinated 5/21 2. Positive close contact with his unvaccinated wife who is currently recovering from [place] herself 3. Currently requiring up to 15L high flow O2 with appropriate O2 sats 4. CXR reviewed with B patchy infiltrates suggestive of COVID PNA 5. CRP elevated over 26, LDH 351, ddimer 1.0 6. Decadron and remdesivir started in ED 7. Given markedly elevated CRP with increased O2 requirements, will add actemra 8. Will follow serial inflammatory markers. If ddimer rises, consider CTA to r/o thrombotic disease. Otherwise, pt currently on prophylactic lovenox 2. HTN 1. BP stable 2. Cont home ACEI as tolerated 3. Repeat bmet in AM 3. DM2 1. Pt initially adamant about not taking subq insulin, and instead wanted to continue home oral glycemic meds only.  2. Pt now understands need for tight glycemic control, especially in setting of high dose steroids and now agrees with insulin 3. Will cont on SSI coverage. Anticipate may need basal coverage given high dose decadron 4. GERD 1. Cont home PPI  DVT prophylaxis: Lovenox subq  Code Status: Full Family Communication: Pt in room, family not at bedside  Disposition Plan: Uncertain at this time  Consults called: CCM Admission status: Inpatient, as would require greater than 2 midnight stay to treat COVID pna   Marylu Lund MD [facility] Pager On Amion  If 7PM-7AM, please contact night-coverage  05/25/2020, 3:17 PM

## 2020-05-29 NOTE — ED Triage Notes (Addendum)
Patient transported from infusion center. Covid+ x7 days. Arrived for first infusion this morning oxygen saturation 73% on RA. Upon arrival to ED, 91% on 6L [place]. Patient c/o SOB.  Patient reports he was vaccinated in May but wife is currently positive.

## 2020-05-29 NOTE — ED Notes (Signed)
Hospitalist at bedside 

## 2020-05-29 NOTE — Progress Notes (Signed)
I connected by phone with Jim Clarke on 06/04/2020 at 10:38 AM to discuss the potential use of a new treatment for mild to moderate COVID-19 viral infection in non-hospitalized patients.  This patient is a 79 y.o. male that meets the FDA criteria for Emergency Use Authorization of COVID monoclonal antibody casirivimab/imdevimab.  Has a (+) direct SARS-CoV-2 viral test result  Has mild or moderate COVID-19   Is NOT hospitalized due to COVID-19  Is within 10 days of symptom onset  Has at least one of the high risk factor(s) for progression to severe COVID-19 and/or hospitalization as defined in EUA.  Specific high risk criteria : Older age (>/= 78 yo) with several other RFs   I have spoken and communicated the following to the patient or parent/caregiver regarding COVID monoclonal antibody treatment:  1. FDA has authorized the emergency use for the treatment of mild to moderate COVID-19 in adults and pediatric patients with positive results of direct SARS-CoV-2 viral testing who are 80 years of age and older weighing at least 40 kg, and who are at high risk for progressing to severe COVID-19 and/or hospitalization.  2. The significant known and potential risks and benefits of COVID monoclonal antibody, and the extent to which such potential risks and benefits are unknown.  3. Information on available alternative treatments and the risks and benefits of those alternatives, including clinical trials.  4. Patients treated with COVID monoclonal antibody should continue to self-isolate and use infection control measures (e.g., wear mask, isolate, social distance, avoid sharing personal items, clean and disinfect "high touch" surfaces, and frequent handwashing) according to CDC guidelines.   5. The patient or parent/caregiver has the option to accept or refuse COVID monoclonal antibody treatment.  After reviewing this information with the patient, The patient agreed to proceed with receiving  casirivimab\imdevimab infusion and will be provided a copy of the Fact sheet prior to receiving the infusion.   Sx onset 7/8. Set up for infusion today @ 12:30pm. Directions given to new [facility] long infusion center.   Angelena Form  06/01/2020 10:38 AM

## 2020-05-29 NOTE — Progress Notes (Signed)
Pt placed on High Flow Nasal Cannula at 10 lpm. Sat 93%. RT will continue to monitor.

## 2020-05-29 NOTE — ED Provider Notes (Signed)
[facility] DEPT Provider Note   CSN: [id number] Arrival date & time: 05/17/2020  1204     History Chief Complaint  Patient presents with  . COVID+    Jim Clarke is a 79 y.o. male.  The history is provided by the patient.  Shortness of Breath Severity:  Moderate Onset quality:  Gradual Timing:  Constant Progression:  Worsening Chronicity:  New Context: activity   Context comment:  Exposed to [place] as wife is positive. Got pfizer vaccine in May. Patient went to covid infusion clinic today and found to be hypoxic in the 70s. Relieved by:  Nothing Worsened by:  Nothing Associated symptoms: cough and fever   Associated symptoms: no abdominal pain, no chest pain, no claudication, no ear pain, no rash, no sore throat and no vomiting   Risk factors: no hx of PE/DVT   Risk factors comment:  DM      Past Medical History:  Diagnosis Date  . Arthritis   . Calcium oxalate renal stones   . Diabetes mellitus   . Dyslipidemia   . ED (erectile dysfunction)   . Former smoker    quit years ago as of 2013  . GERD (gastroesophageal reflux disease)   . Hypertension   . Superficial basal cell carcinoma 08/31/15    Patient Active Problem List   Diagnosis Date Noted  . Acute hypoxemic respiratory failure ([place]) 06/08/2020  . History of melanoma 03/31/2020  . Gastroesophageal reflux disease without esophagitis 03/31/2020  . Family history of colon cancer 03/31/2020  . Hyperplastic polyp of large intestine 08/30/2018  . Former smoker 02/25/2018  . Atherosclerosis 02/01/2017  . Hyperlipidemia associated with type 2 diabetes mellitus ([place]) 07/01/2012  . Type 2 diabetes with complication ([place]) 63/81/7711  . Hypertension associated with diabetes ([place]) 06/27/2011  . ED (erectile dysfunction) 06/27/2011    Past Surgical History:  Procedure Laterality Date  . APPENDECTOMY    . CATARACT EXTRACTION W/ INTRAOCULAR LENS  IMPLANT, BILATERAL Bilateral 2016    . COLONOSCOPY  2004,2009,2011  . KNEE ARTHROSCOPY     RIGHT  Eulas Post MD)  . SKIN BIOPSY Right 09/19/2018   epidermoid cyst ( see report in chart )  . UPPER GASTROINTESTINAL ENDOSCOPY     schatzki ring,  LA Grade A reflux esophagitis       Family History  Problem Relation Age of Onset  . Arthritis Mother   . Mental illness Mother   . Seizures Mother   . Arthritis Father   . Cancer Father   . Diabetes Father     Social History   Tobacco Use  . Smoking status: Former Smoker    Packs/day: 3.00    Years: 35.00    Pack years: 105.00    Quit date: 1999    Years since quitting: 22.5  . Smokeless tobacco: Never Used  Substance Use Topics  . Alcohol use: Yes    Alcohol/week: 1.0 standard drink    Types: 1 Cans of beer per week  . Drug use: No    Home Medications Prior to Admission medications   Medication Sig Start Date End Date Taking? Authorizing Provider  albuterol (VENTOLIN HFA) 108 (90 Base) MCG/ACT inhaler Inhale 2 puffs into the lungs every 6 (six) hours as needed for wheezing or shortness of breath. 05/28/20   Tysinger, Camelia Eng, PA-C  aspirin 81 MG tablet Take 81 mg by mouth daily.      [provider]  atorvastatin (LIPITOR) 20 MG  tablet Take 1 tablet (20 mg total) by mouth daily. 02/06/17   Denita Lung, MD  Blood Glucose Monitoring Suppl (ONE TOUCH ULTRA 2) w/Device KIT 1 each by Does not apply route daily. Pt testing twice daily 09/30/19   Denita Lung, MD  Blood Pressure Monitoring [facility]) MISC 1 Device by Does not apply route every morning. 05/28/12   Charlann Lange, PA-C  budesonide-formoterol (SYMBICORT) 160-4.5 MCG/ACT inhaler Inhale 2 puffs into the lungs 2 (two) times daily for 1 day. Patient not taking: Reported on 10/29/2018 09/09/18 09/10/18  Marshell Garfinkel, MD  empagliflozin (JARDIANCE) 10 MG TABS tablet Take 10 mg by mouth daily. 01/29/20   Denita Lung, MD  glipiZIDE (GLUCOTROL) 5 MG tablet Take 1 tablet (5 mg total) by mouth  daily. Patient taking differently: Take 5 mg by mouth 2 (two) times daily before a meal.  08/23/15   Denita Lung, MD  glucose blood (ONE TOUCH ULTRA TEST) test strip 1 each by Other route 2 (two) times daily. Use as instructed Patient is to test bid DX:E11.9 11/25/15   Denita Lung, MD  glucose blood test strip Use as instructed pt is to test two times daily 250.00 08/22/12   Denita Lung, MD  HYDROcodone-homatropine [facility]) 5-1.5 MG/5ML syrup Take 5 mLs by mouth every 8 (eight) hours as needed for up to 5 days. 05/28/20 06/02/20  Tysinger, Camelia Eng, PA-C  Lancets [facility] ULTRASOFT) lancets Use as instructed 11/25/15   Denita Lung, MD  lisinopril (ZESTRIL) 5 MG tablet Take 1 tablet (5 mg total) by mouth daily. 01/29/20   Denita Lung, MD  metFORMIN (GLUCOPHAGE) 1000 MG tablet Take 1 tablet (1,000 mg total) by mouth 2 (two) times daily with a meal. 01/02/20   Denita Lung, MD  mometasone (NASONEX) 50 MCG/ACT nasal spray Place 2 sprays into the nose daily. Patient not taking: Reported on 03/31/2020 10/29/18   Marshell Garfinkel, MD  Multiple Vitamins-Minerals (EMERGEN-C IMMUNE PLUS) PACK Take 1 tablet by mouth 2 (two) times daily. 05/28/20   Tysinger, Camelia Eng, PA-C  Multiple Vitamins-Minerals (MULTIVITAMIN WITH MINERALS) tablet Take 1 tablet by mouth daily.   Patient not taking: Reported on 05/28/2020    [provider]  omeprazole (PRILOSEC) 20 MG capsule Take by mouth. Patient not taking: Reported on 05/28/2020    [provider]    Allergies    Sulfa antibiotics  Review of Systems   Review of Systems  Constitutional: Positive for fever. Negative for chills.  HENT: Negative for ear pain and sore throat.   Eyes: Negative for pain and visual disturbance.  Respiratory: Positive for cough and shortness of breath.   Cardiovascular: Negative for chest pain, palpitations and claudication.  Gastrointestinal: Negative for abdominal pain and vomiting.  Genitourinary:  Negative for dysuria and hematuria.  Musculoskeletal: Negative for arthralgias and back pain.  Skin: Negative for color change and rash.  Neurological: Negative for seizures and syncope.  All other systems reviewed and are negative.   Physical Exam Updated Vital Signs  ED Triage Vitals  Enc Vitals Group     BP 06/01/2020 1206 (!) 143/66     Pulse Rate 05/15/2020 1206 (!) 107     Resp 05/14/2020 1206 (!) 31     Temp 05/19/2020 1206 99.1 F (37.3 C)     Temp Source 05/27/2020 1206 Oral     SpO2 05/19/2020 1206 91 %     Weight --      Height --  Head Circumference --      Peak Flow --      Pain Score 05/14/2020 1207 0     Pain Loc --      Pain Edu? --      Excl. in [place]? --     Physical Exam Vitals and nursing note reviewed.  Constitutional:      General: He is not in acute distress.    Appearance: He is well-developed. He is not ill-appearing.  HENT:     Head: Normocephalic and atraumatic.     Nose: Nose normal.     Mouth/Throat:     Mouth: Mucous membranes are moist.  Eyes:     Extraocular Movements: Extraocular movements intact.     Conjunctiva/sclera: Conjunctivae normal.     Pupils: Pupils are equal, round, and reactive to light.  Cardiovascular:     Rate and Rhythm: Normal rate and regular rhythm.     Pulses: Normal pulses.     Heart sounds: Normal heart sounds. No murmur heard.   Pulmonary:     Effort: No respiratory distress.     Comments: Slight increased work of breathing, coarse breath sounds throughout Abdominal:     Palpations: Abdomen is soft.     Tenderness: There is no abdominal tenderness.  Musculoskeletal:        General: No tenderness. Normal range of motion.     Cervical back: Neck supple.  Skin:    General: Skin is warm and dry.     Capillary Refill: Capillary refill takes less than 2 seconds.  Neurological:     General: No focal deficit present.     Mental Status: He is alert.  Psychiatric:        Mood and Affect: Mood normal.     ED Results  / Procedures / Treatments   Labs (all labs ordered are listed, but only abnormal results are displayed) Labs Reviewed  CBC WITH DIFFERENTIAL/PLATELET - Abnormal; Notable for the following components:      Result Value   Lymphs Abs 0.4 (*)    All other components within normal limits  COMPREHENSIVE METABOLIC PANEL - Abnormal; Notable for the following components:   Glucose, Bld 225 (*)    BUN 33 (*)    Creatinine, Ser 1.71 (*)    Calcium 8.3 (*)    Albumin 3.2 (*)    AST 43 (*)    GFR calc non Af Amer 38 (*)    GFR calc Af Amer 43 (*)    Anion gap 17 (*)    All other components within normal limits  D-DIMER, QUANTITATIVE (NOT AT [facility]) - Abnormal; Notable for the following components:   D-Dimer, Quant 1.04 (*)    All other components within normal limits  LACTATE DEHYDROGENASE - Abnormal; Notable for the following components:   LDH 351 (*)    All other components within normal limits  FERRITIN - Abnormal; Notable for the following components:   Ferritin 339 (*)    All other components within normal limits  TRIGLYCERIDES - Abnormal; Notable for the following components:   Triglycerides 198 (*)    All other components within normal limits  FIBRINOGEN - Abnormal; Notable for the following components:   Fibrinogen 773 (*)    All other components within normal limits  C-REACTIVE PROTEIN - Abnormal; Notable for the following components:   CRP 26.5 (*)    All other components within normal limits  SARS CORONAVIRUS 2 BY RT PCR (HOSPITAL ORDER, PERFORMED  [facility])  CULTURE, BLOOD (ROUTINE X 2)  CULTURE, BLOOD (ROUTINE X 2)  LACTIC ACID, PLASMA  PROCALCITONIN  LACTIC ACID, PLASMA    EKG EKG Interpretation  Date/Time:  Saturday May 29 2020 12:48:36 EDT Ventricular Rate:  101 PR Interval:    QRS Duration: 92 QT Interval:  341 QTC Calculation: 442 R Axis:   -18 Text Interpretation: Sinus tachycardia Atrial premature complex Borderline left axis deviation  Low voltage, precordial leads Confirmed by Lennice Sites [phone]) on 06/05/2020 12:54:03 PM   Radiology DG Chest Port 1 View  Result Date: 05/16/2020 CLINICAL DATA:  COVID-19 positive. Hypoxia. EXAM: PORTABLE CHEST 1 VIEW COMPARISON:  February 25, 2018 FINDINGS: Cardiomediastinal silhouette is normal. Mediastinal contours appear intact. There is no evidence of pleural effusion or pneumothorax. Bilateral patchy airspace consolidation with lower lobe predominance, worse in the left lower lung field, consistent with multifocal pneumonia. Osseous structures are without acute abnormality. Soft tissues are grossly normal. IMPRESSION: Bilateral patchy airspace consolidation with lower lobe predominance, worse in the left lower lung field, consistent with multifocal pneumonia. This may represent a viral pneumonia secondary to COVID-19 infection, however superimposed lobar pneumonia in the left lower lung field cannot be excluded. Electronically Signed   By: Fidela Salisbury M.D.   On: 05/21/2020 13:09    Procedures .Critical Care Performed by: Lennice Sites, DO Authorized by: Lennice Sites, DO   Critical care provider statement:    Critical care time (minutes):  35   Critical care was necessary to treat or prevent imminent or life-threatening deterioration of the following conditions:  Respiratory failure   Critical care was time spent personally by me on the following activities:  Development of treatment plan with patient or surrogate, blood draw for specimens, discussions with consultants, discussions with primary provider, evaluation of patient's response to treatment, obtaining history from patient or surrogate, ordering and review of laboratory studies, ordering and review of radiographic studies, ordering and performing treatments and interventions, pulse oximetry, re-evaluation of patient's condition and review of old charts   I assumed direction of critical care for this patient from another  provider in my specialty: no     (including critical care time)  Medications Ordered in ED Medications  remdesivir 100 mg in sodium chloride 0.9 % 100 mL IVPB (100 mg Intravenous New Bag/Given (Non-Interop) 06/09/2020 1417)    Followed by  remdesivir 100 mg in sodium chloride 0.9 % 100 mL IVPB (has no administration in time range)  dexamethasone (DECADRON) injection 10 mg (10 mg Intravenous Given 05/14/2020 1305)    ED Course  I have reviewed the triage vital signs and the nursing notes.  Pertinent labs & imaging results that were available during my care of the patient were reviewed by me and considered in my medical decision making (see chart for details).    MDM Rules/Calculators/A&P                          Jim Clarke is a 79 year old male with history of hypertension, high cholesterol, diabetes who presents to the ED with shortness of breath.  Patient with hypoxia in the 70s at Covid infusion clinic.  Patient is presumed to have coronavirus as his wife has coronavirus and he has had shortness of breath cough and fever over the last week.  Patient has been vaccinated he states with [place] vaccination back in May.  Patient still hypoxic on 6 L of oxygen and respiratory  therapy place patient on high flow nasal cannula at 10 L and oxygenation is now 91-92%.  Work of breathing overall is slightly increased but he appears comfortable and speaks in full sentences.  Patient otherwise denies any chest pain, leg swelling.  No history of blood clots.  Chest x-ray consistent with Covid pneumonia.  Will order Decadron, IV remdesivir.   On reevaluation patient had knocked off nasal cannula.  Hypoxic back in the 70s.  Placed on 12 L of high flow and oxygen slowly improved to the low 90s.  Given high oxygen need will talk to ICU about admission.  No significant leukocytosis, anemia.  Mild AKI.  Lactic acid normal.  Inflammatory markers elevated including D-dimer, LDH, triglycerides, fibrinogen.  ICU  recommends admission to hospitalist and stepdown unit.  They will consult.  Hospitalist has accepted the patient for further care.  This chart was dictated using voice recognition software.  Despite best efforts to proofread,  errors can occur which can change the documentation meaning.    Final Clinical Impression(s) / ED Diagnoses Final diagnoses:  COVID-19  Acute respiratory failure with hypoxia [facility])    Rx / DC Orders ED Discharge Orders    None       Lennice Sites, DO 06/07/2020 1418

## 2020-05-29 NOTE — ED Notes (Signed)
Respiratory called for high flow nasal cannula per MD request.

## 2020-05-29 NOTE — ED Notes (Signed)
XR at bedside

## 2020-05-29 NOTE — Progress Notes (Addendum)
1130 Pt arrived to [facility],  ([facility] 1W) vital signs obtained. O2 on RA 74%. Pt started on NL, titrated O2 up to 6L , O2 now around 90%. Rapid response called to transfer pt down to ED. Pt's wife called and updated with status.   Yaak, RN, rapid response RN assisted pt to wheelchair and took down to ED.

## 2020-05-30 DIAGNOSIS — J1282 Pneumonia due to Coronavirus disease 2019: Secondary | ICD-10-CM | POA: Diagnosis not present

## 2020-05-30 DIAGNOSIS — U071 COVID-19: Secondary | ICD-10-CM | POA: Diagnosis not present

## 2020-05-30 LAB — CBC
HCT: 40.6 % (ref 39.0–52.0)
Hemoglobin: 13.2 g/dL (ref 13.0–17.0)
MCH: 31 pg (ref 26.0–34.0)
MCHC: 32.5 g/dL (ref 30.0–36.0)
MCV: 95.3 fL (ref 80.0–100.0)
Platelets: 172 10*3/uL (ref 150–400)
RBC: 4.26 MIL/uL (ref 4.22–5.81)
RDW: 13.6 % (ref 11.5–15.5)
WBC: 4.7 10*3/uL (ref 4.0–10.5)
nRBC: 0 % (ref 0.0–0.2)

## 2020-05-30 LAB — BASIC METABOLIC PANEL
Anion gap: 14 (ref 5–15)
BUN: 59 mg/dL — ABNORMAL HIGH (ref 8–23)
CO2: 23 mmol/L (ref 22–32)
Calcium: 8.5 mg/dL — ABNORMAL LOW (ref 8.9–10.3)
Chloride: 102 mmol/L (ref 98–111)
Creatinine, Ser: 1.49 mg/dL — ABNORMAL HIGH (ref 0.61–1.24)
GFR calc Af Amer: 51 mL/min — ABNORMAL LOW (ref >60)
GFR calc non Af Amer: 44 mL/min — ABNORMAL LOW (ref >60)
Glucose, Bld: 446 mg/dL — ABNORMAL HIGH (ref 70–99)
Potassium: 4.3 mmol/L (ref 3.5–5.1)
Sodium: 139 mmol/L (ref 135–145)

## 2020-05-30 LAB — D-DIMER, QUANTITATIVE: D-Dimer, Quant: 1.06 ug/mL-FEU — ABNORMAL HIGH (ref 0.00–0.50)

## 2020-05-30 LAB — COMPREHENSIVE METABOLIC PANEL
ALT: 38 U/L (ref 0–44)
AST: 51 U/L — ABNORMAL HIGH (ref 15–41)
Albumin: 3.1 g/dL — ABNORMAL LOW (ref 3.5–5.0)
Alkaline Phosphatase: 49 U/L (ref 38–126)
Anion gap: 23 — ABNORMAL HIGH (ref 5–15)
BUN: 47 mg/dL — ABNORMAL HIGH (ref 8–23)
CO2: 17 mmol/L — ABNORMAL LOW (ref 22–32)
Calcium: 8.4 mg/dL — ABNORMAL LOW (ref 8.9–10.3)
Chloride: 101 mmol/L (ref 98–111)
Creatinine, Ser: 1.42 mg/dL — ABNORMAL HIGH (ref 0.61–1.24)
GFR calc Af Amer: 54 mL/min — ABNORMAL LOW (ref >60)
GFR calc non Af Amer: 47 mL/min — ABNORMAL LOW (ref >60)
Glucose, Bld: 300 mg/dL — ABNORMAL HIGH (ref 70–99)
Potassium: 4.5 mmol/L (ref 3.5–5.1)
Sodium: 141 mmol/L (ref 135–145)
Total Bilirubin: 0.8 mg/dL (ref 0.3–1.2)
Total Protein: 7.4 g/dL (ref 6.5–8.1)

## 2020-05-30 LAB — C-REACTIVE PROTEIN: CRP: 28.7 mg/dL — ABNORMAL HIGH (ref <1.0)

## 2020-05-30 LAB — FERRITIN: Ferritin: 448 ng/mL — ABNORMAL HIGH (ref 24–336)

## 2020-05-30 MED ORDER — METHYLPREDNISOLONE SODIUM SUCC 40 MG IJ SOLR
40.0000 mg | Freq: Two times a day (BID) | INTRAMUSCULAR | Status: DC
  Administered 2020-05-30 – 2020-06-02 (×8): 40 mg via INTRAVENOUS
  Filled 2020-05-30 (×8): qty 1

## 2020-05-30 MED ORDER — INSULIN GLARGINE 100 UNIT/ML ~~LOC~~ SOLN
12.0000 [IU] | Freq: Every day | SUBCUTANEOUS | Status: DC
  Administered 2020-05-30 – 2020-05-31 (×2): 12 [IU] via SUBCUTANEOUS
  Filled 2020-05-30 (×2): qty 0.12

## 2020-05-30 MED ORDER — LACTATED RINGERS IV SOLN: INTRAVENOUS | Status: DC

## 2020-05-30 MED ORDER — TOCILIZUMAB 400 MG/20ML IV SOLN
700.0000 mg | Freq: Once | INTRAVENOUS | Status: AC
  Administered 2020-05-30: 700 mg via INTRAVENOUS
  Filled 2020-05-30: qty 35

## 2020-05-30 NOTE — Progress Notes (Addendum)
PROGRESS NOTE  Jim Clarke  EHU:[id number] DOB: May 26, 1941 DOA: 05/22/2020 PCP: Denita Lung, MD  Brief Narrative:  72 [employer] Veteran Prior melanoma DM ty2, prior smoke [pft 12/19 dr Vaughan Browner no obst--thought GERD] with ?Nodule [disproven on subsq scans] BMI 30-htn hld  Admit with COvuid 19 from Infusion center on 7/17 initally 15l [place] needed crp 26, dimer 1 Rx [place]rom, remdisivir, actemra   Assessment & Plan:   Principal Problem:   Pneumonia due to COVID-19 virus Active Problems:   Hypertension associated with diabetes ([place])   Type 2 diabetes with complication ([place])   Gastroesophageal reflux disease without esophagitis   Acute hypoxemic respiratory failure ([place])   1. Covid 19 pneumonia with SIRS criterion/early sepsis evidenced by hypothermia, AKI E TC-severe with escalating hypoxia a. Covid pathway remdesivir/steroids/proning/high flow oxygen b. Actemra X1 7/17 and will repeat 7/18 given CRP elevated above 20 still and increasing c. Risk factors = obesity, DM TY 2 BMI >31 d. Changing Decadron 6 mg to Solu-Medrol 1 mg/k q12 e. High risk for decompensation-keep on stepdown 2. hypergleycemia from Steroids superimposed/Dm ty2 a. CBGs elevated 225-300 currently b. Adding Lantus 12 units c. Expect hyperglycemia will not resolve quickly given need for steroids d. Monitor daily labs 3. AKI with metabolic acidosis a. Component of hyperglycemia but also secondary to sepsis from Covid b. Watch trends carefully c. Have started LR at 50 cc/h and have discontinued her lisinopril 5 4. htn a. Monitor trends-do not cover unless above 785 systolic 5. bmi 30 a. Stable 6. hld 7. reflux  DVT prophylaxis: Lovenox prophylactic dosing Code Status: Full Family Communication: None present Disposition:   Status is: Inpatient  Remains inpatient appropriate because:Hemodynamically unstable, Persistent severe electrolyte disturbances, Ongoing diagnostic testing needed not appropriate for  outpatient work up, Unsafe d/c plan and IV treatments appropriate due to intensity of illness or inability to take PO   Dispo: The patient is from: Home              Anticipated d/c is to: Unclear at this time too early to say              Anticipated d/c date is: > 3 days              Patient currently is not medically stable to d/c.       Consultants:   None yet  Procedures: None  Antimicrobials: None   Subjective: Doing fair but requiring both nonrebreather and high flow oxygen No chest pain fever Some abdominal distress No sputum Simple ambulation causes him to be winded   Objective: Vitals:   05/30/20 0400 05/30/20 0800 05/30/20 0853 05/30/20 1000  BP: (!) 130/53     Pulse: 73   91  Resp: (!) 26   (!) 25  Temp:  (!) 96.3 F (35.7 C)    TempSrc:  Axillary    SpO2: 91%  (!) 88% (!) 88%  Weight:      Height:        Intake/Output Summary (Last 24 hours) at 05/30/2020 1141 Last data filed at 05/30/2020 1100 Gross per 24 hour  Intake 405 ml  Output 1150 ml  Net -745 ml   Filed Weights   06/11/2020 1600  Weight: 86.9 kg    Examination:  General exam: Awake coherent pleasant no distress Mallampati 4 Respiratory system: No rales no rhonchi diffuse coarse wheezes Cardiovascular system: S1-S2 slight tachycardic Gastrointestinal system: Soft nontender no rebound no guarding. Central nervous system: Neurologically  intact no focal deficit Extremities: Soft no tenderness no lower extremity pain Skin: As above Psychiatry: Euthymic pleasant  Data Reviewed: I have personally reviewed following labs and imaging studies  COVID-19 Labs  Recent Labs    06/12/2020 1259 05/30/20 0339 05/30/20 0750  DDIMER 1.04* 1.06*  --   FERRITIN 339*  --  448*  LDH 351*  --   --   CRP 26.5*  --  28.7*    Lab Results  Component Value Date   SARSCOV2NAA POSITIVE (A) 05/13/2020   BUN/creatinine has climbed to 47/1.4 Bicarb is 17, anion gap is 23 AST/ALT 51/38 Hemoglobin  13 White count 4.7 platelet 172  Radiology Studies: DG Chest Port 1 View  Result Date: 06/08/2020 CLINICAL DATA:  COVID-19 positive. Hypoxia. EXAM: PORTABLE CHEST 1 VIEW COMPARISON:  February 25, 2018 FINDINGS: Cardiomediastinal silhouette is normal. Mediastinal contours appear intact. There is no evidence of pleural effusion or pneumothorax. Bilateral patchy airspace consolidation with lower lobe predominance, worse in the left lower lung field, consistent with multifocal pneumonia. Osseous structures are without acute abnormality. Soft tissues are grossly normal. IMPRESSION: Bilateral patchy airspace consolidation with lower lobe predominance, worse in the left lower lung field, consistent with multifocal pneumonia. This may represent a viral pneumonia secondary to COVID-19 infection, however superimposed lobar pneumonia in the left lower lung field cannot be excluded. Electronically Signed   By: Fidela Salisbury M.D.   On: 05/16/2020 13:09     Scheduled Meds: . albuterol  2 puff Inhalation Q6H  . vitamin C  500 mg Oral Daily  . atorvastatin  20 mg Oral Daily  . Chlorhexidine Gluconate Cloth  6 each Topical Daily  . enoxaparin (LOVENOX) injection  40 mg Subcutaneous Q24H  . feeding supplement (ENSURE ENLIVE)  237 mL Oral BID BM  . insulin aspart  0-15 Units Subcutaneous TID WC  . insulin aspart  0-5 Units Subcutaneous QHS  . lisinopril  5 mg Oral QHS  . mouth rinse  15 mL Mouth Rinse BID  . methylPREDNISolone (SOLU-MEDROL) injection  40 mg Intravenous Q12H  . pantoprazole  40 mg Oral Daily  . zinc sulfate  220 mg Oral Daily   Continuous Infusions: . remdesivir 100 mg in NS 100 mL Stopped (05/30/20 1031)     LOS: 1 day    Time spent: 97 minutes  Nita Sells, MD [facility] To contact the attending provider between 7A-7P or the covering provider during after hours 7P-7A, please log into the web site [email or web address] and access using universal [place] password for  that web site. If you do not have the password, please call the hospital operator.  05/30/2020, 11:41 AM

## 2020-05-30 NOTE — Progress Notes (Addendum)
CRITICAL VALUE ALERT  Critical Value:  CBG 407  Date & Time Notied:  7/18 1815  Provider Notified: Dr Verlon Au paged  Orders Received/Actions taken: Per MD stat bmet ordered, lantus and novolog given

## 2020-05-31 DIAGNOSIS — U071 COVID-19: Secondary | ICD-10-CM | POA: Diagnosis not present

## 2020-05-31 DIAGNOSIS — J1282 Pneumonia due to Coronavirus disease 2019: Secondary | ICD-10-CM | POA: Diagnosis not present

## 2020-05-31 LAB — GLUCOSE, CAPILLARY: Glucose-Capillary: 248 mg/dL — ABNORMAL HIGH (ref 70–99)

## 2020-05-31 LAB — COMPREHENSIVE METABOLIC PANEL
ALT: 32 U/L (ref 0–44)
AST: 36 U/L (ref 15–41)
Albumin: 2.9 g/dL — ABNORMAL LOW (ref 3.5–5.0)
Alkaline Phosphatase: 51 U/L (ref 38–126)
Anion gap: 14 (ref 5–15)
BUN: 54 mg/dL — ABNORMAL HIGH (ref 8–23)
CO2: 22 mmol/L (ref 22–32)
Calcium: 8.6 mg/dL — ABNORMAL LOW (ref 8.9–10.3)
Chloride: 102 mmol/L (ref 98–111)
Creatinine, Ser: 1.24 mg/dL (ref 0.61–1.24)
GFR calc Af Amer: >60 mL/min (ref >60)
GFR calc non Af Amer: 55 mL/min — ABNORMAL LOW (ref >60)
Glucose, Bld: 371 mg/dL — ABNORMAL HIGH (ref 70–99)
Potassium: 4.5 mmol/L (ref 3.5–5.1)
Sodium: 138 mmol/L (ref 135–145)
Total Bilirubin: 0.9 mg/dL (ref 0.3–1.2)
Total Protein: 6.9 g/dL (ref 6.5–8.1)

## 2020-05-31 LAB — CBC WITH DIFFERENTIAL/PLATELET
Abs Immature Granulocytes: 0.07 10*3/uL (ref 0.00–0.07)
Basophils Absolute: 0 10*3/uL (ref 0.0–0.1)
Basophils Relative: 0 %
Eosinophils Absolute: 0 10*3/uL (ref 0.0–0.5)
Eosinophils Relative: 0 %
HCT: 40.8 % (ref 39.0–52.0)
Hemoglobin: 13.6 g/dL (ref 13.0–17.0)
Immature Granulocytes: 1 %
Lymphocytes Relative: 7 %
Lymphs Abs: 0.5 10*3/uL — ABNORMAL LOW (ref 0.7–4.0)
MCH: 31.7 pg (ref 26.0–34.0)
MCHC: 33.3 g/dL (ref 30.0–36.0)
MCV: 95.1 fL (ref 80.0–100.0)
Monocytes Absolute: 0.3 10*3/uL (ref 0.1–1.0)
Monocytes Relative: 4 %
Neutro Abs: 6.1 10*3/uL (ref 1.7–7.7)
Neutrophils Relative %: 88 %
Platelets: 212 10*3/uL (ref 150–400)
RBC: 4.29 MIL/uL (ref 4.22–5.81)
RDW: 13.4 % (ref 11.5–15.5)
WBC: 7 10*3/uL (ref 4.0–10.5)
nRBC: 0 % (ref 0.0–0.2)

## 2020-05-31 LAB — FERRITIN: Ferritin: 624 ng/mL — ABNORMAL HIGH (ref 24–336)

## 2020-05-31 LAB — HEMOGLOBIN A1C
Hgb A1c MFr Bld: 7.5 % — ABNORMAL HIGH (ref 4.8–5.6)
Mean Plasma Glucose: 168.55 mg/dL

## 2020-05-31 LAB — D-DIMER, QUANTITATIVE: D-Dimer, Quant: 1.06 ug/mL-FEU — ABNORMAL HIGH (ref 0.00–0.50)

## 2020-05-31 LAB — C-REACTIVE PROTEIN: CRP: 18.5 mg/dL — ABNORMAL HIGH (ref <1.0)

## 2020-05-31 MED ORDER — LIVING WELL WITH DIABETES BOOK
Freq: Once | Status: AC
  Filled 2020-05-31: qty 1

## 2020-05-31 MED ORDER — INSULIN ASPART 100 UNIT/ML ~~LOC~~ SOLN: 0.0000 [IU] | Freq: Every day | SUBCUTANEOUS | Status: DC

## 2020-05-31 MED ORDER — INSULIN STARTER KIT- PEN NEEDLES (ENGLISH)
1.0000 | Freq: Once | Status: AC
  Administered 2020-06-01: 1
  Filled 2020-05-31: qty 1

## 2020-05-31 MED ORDER — INSULIN ASPART 100 UNIT/ML ~~LOC~~ SOLN
6.0000 [IU] | Freq: Three times a day (TID) | SUBCUTANEOUS | Status: DC
  Administered 2020-05-31 – 2020-06-02 (×6): 6 [IU] via SUBCUTANEOUS

## 2020-05-31 MED ORDER — INSULIN GLARGINE 100 UNIT/ML ~~LOC~~ SOLN
20.0000 [IU] | Freq: Every day | SUBCUTANEOUS | Status: DC
  Administered 2020-06-01 – 2020-06-05 (×5): 20 [IU] via SUBCUTANEOUS
  Filled 2020-05-31 (×5): qty 0.2

## 2020-05-31 MED ORDER — INSULIN ASPART 100 UNIT/ML ~~LOC~~ SOLN
0.0000 [IU] | Freq: Three times a day (TID) | SUBCUTANEOUS | Status: DC
  Administered 2020-05-31: 11 [IU] via SUBCUTANEOUS
  Administered 2020-05-31: 20 [IU] via SUBCUTANEOUS
  Administered 2020-06-01: 11 [IU] via SUBCUTANEOUS
  Administered 2020-06-01 – 2020-06-02 (×3): 4 [IU] via SUBCUTANEOUS
  Administered 2020-06-02: 7 [IU] via SUBCUTANEOUS
  Administered 2020-06-03: 4 [IU] via SUBCUTANEOUS
  Administered 2020-06-04: 15 [IU] via SUBCUTANEOUS

## 2020-05-31 MED ORDER — DOCUSATE SODIUM 100 MG PO CAPS
100.0000 mg | ORAL_CAPSULE | Freq: Once | ORAL | Status: AC
  Administered 2020-05-31: 100 mg via ORAL
  Filled 2020-05-31: qty 1

## 2020-05-31 NOTE — Progress Notes (Signed)
Initial Nutrition Assessment  DOCUMENTATION CODES:   Not applicable  INTERVENTION:  - continue Ensure Enlive BID, each supplement provides 350 kcal and 20 grams of protein. - will order Magic Cup BID with meals, each supplement provides 290 kcal and 9 grams of protein. - will monitor for additional needs.  NUTRITION DIAGNOSIS:   Increased nutrient needs related to acute illness, catabolic illness (CHYIF-02 infection) as evidenced by estimated needs.  GOAL:   Patient will meet greater than or equal to 90% of their needs  MONITOR:   PO intake, Supplement acceptance, Labs, Weight trends  REASON FOR ASSESSMENT:   Malnutrition Screening Tool  ASSESSMENT:   79 y.o. male with medical history of type 2 DM on oral hyperglycemic meds, former tobacco abuse, and obesity. He presented to the ED from monoclonal infusion clinic with hypoxia and SOB. He was vaccinated in 03/2020 and has been exposed to unvaccinated wife who is currently recovering from [place]. He called PCP on 7/15 for generalized malaise, fevers, coughing, SOB and was subsequently referred for outpatient monoclonal infusion scheduled for 7/17. While at infusion center, patient noted to have O2 sats into the 70% requiring up 6L on initial presentation.  Per flow sheet, he ate 100% of breakfast and 75% of lunch yesterday (total of 699 kcal, 38 grams protein). Ensure Enlive was ordered BID on admission and patient has accepted all 3 of the bottles offered so far.  Did not enter patient's room today. Able to talk with RN outside of his room. She reports that patient ate all of breakfast except for fruit cup. He did not have any Ensure this AM.  Per chart review, weight on 7/17 was 191 lb and weight on 03/31/20 was 205 lb. This indicates 14 lb weight loss (6.8% body weight) in the past 2 months. Will monitor weight trends closely.   Per notes: - COVID-19 PNA with SIRS and sepsis - hyperglycemia 2/2 steroids for patient with hx of  DM - AKI - metabolic acidosis   Labs reviewed; CBG: 248 mg/dl, BUN: 54 mg/dl, Ca: 8.6 mg/dl, GFR: 55 ml/min.  Medications reviewed; 500 mg ascorbic acid/day, 100 mg colace x1 dose 7/19, sliding scale novolog, 6 units novolog TID, 20 units lantus/day, 40 mg solu-medrol BID, 40 mg oral protonix/day, 100 mg IV remdesivir x1 dose/day (7/18-7/21), 700 mg actemra x1 dose 7/18, 220 mg zinc sulfate/day.  IVF; LR @ 50 ml/hr.    NUTRITION - FOCUSED PHYSICAL EXAM:  not completed at this time.   Diet Order:   Diet Order            Diet Carb Modified Fluid consistency: Thin; Room service appropriate? Yes  Diet effective now                 EDUCATION NEEDS:   No education needs have been identified at this time  Skin:  Skin Assessment: Reviewed RN Assessment  Last BM:  7/19  Height:   Ht Readings from Last 1 Encounters:  05/17/2020 5\' 7"  (1.702 m)    Weight:   Wt Readings from Last 1 Encounters:  06/04/2020 86.9 kg     Estimated Nutritional Needs:  Kcal:  2100-2300 kcal Protein:  105-115 grams Fluid:  >/= 2.1 L/day     Jarome Matin, MS, RD, LDN, CNSC Inpatient Clinical Dietitian RD pager # available in AMION  After hours/weekend pager # available in [facility]

## 2020-05-31 NOTE — Progress Notes (Signed)
PROGRESS NOTE  Jim Clarke  WUJ:[id number] DOB: 06-04-1941 DOA: 06/06/2020 PCP: Denita Lung, MD  Brief Narrative:  35 WM Veteran Prior melanoma DM ty2, prior smoke [pft 12/19 dr Vaughan Browner no obst--thought GERD] with ?Nodule [disproven on subsq scans] BMI 30-htn hld  Admit with COvuid 19 from Infusion center on 7/17 initally 15l [place] needed crp 26, dimer 1 Rx Decadrom, remdisivir, actemra   Assessment & Plan:   Principal Problem:   Pneumonia due to COVID-19 virus Active Problems:   Hypertension associated with diabetes ([place])   Type 2 diabetes with complication ([place])   Gastroesophageal reflux disease without esophagitis   Acute hypoxemic respiratory failure ([place])   1. Covid 19 pneumonia with SIRS criterion/early sepsis evidenced by hypothermia, AKI E TC-severe with escalating hypoxia a. Covid pathway remdesivir/steroids/proning/high flow oxygen b. Actemra X1 7/17 and repeated 7/18 c. Risk factors = obesity, DM TY 2 BMI >31 d. 7/18 changed to Solu-Medrol 1 mg/k q12 e. High risk for decompensation-keep on stepdown f. X-ray from 717 not really much change would not add antibiotics at this stage 2. hypergleycemia from Steroids superimposed/Dm ty2 a. CBGs very elevated b. Adding Lantus 12 units and increase to 20 units on 7/19 c. Placed on resistant scale 3. AKI with metabolic acidosis a. Component of hyperglycemia but also secondary to sepsis from Covid b. Acidosis and trends of AKI are improved c. Have started LR at 50 cc/h  d. discontinued lisinopril 5 4. htn a. Monitor trends-do not cover unless above 956 systolic 5. bmi 30 a. Stable 6. hld  7. Reflux  DVT prophylaxis: Lovenox prophylactic dosing Code Status: Full Family Communication: None present Disposition:   Status is: Inpatient  Remains inpatient appropriate because:Hemodynamically unstable, Persistent severe electrolyte disturbances, Ongoing diagnostic testing needed not appropriate for outpatient work up,  Unsafe d/c plan and IV treatments appropriate due to intensity of illness or inability to take PO   Dispo: The patient is from: Home              Anticipated d/c is to: Unclear at this time too early to say              Anticipated d/c date is: > 3 days              Patient currently is not medically stable to d/c.       Consultants:   None yet  Procedures: None  Antimicrobials: None   Subjective: Doing fair but requiring both nonrebreather and high flow oxygen Oxygen sats are in the 85 but he seems very comfortable He has been able to sleep to some degree He has no chest pain or vomiting He feels like he has had a good stool Some confusion out of deep sleep last night   Objective: Vitals:   05/31/20 0400 05/31/20 0750 05/31/20 0800 05/31/20 0842  BP:   116/82   Pulse: 84 79 81 100  Resp: (!) 22 (!) 23 (!) 31 (!) 25  Temp:  (!) 96.7 F (35.9 C)    TempSrc:  Axillary    SpO2: (!) 85% (!) 87% (!) 86% (!) 85%  Weight:      Height:        Intake/Output Summary (Last 24 hours) at 05/31/2020 1046 Last data filed at 05/31/2020 0300 Gross per 24 hour  Intake 717.21 ml  Output 1675 ml  Net -957.79 ml   Filed Weights   05/30/2020 1600  Weight: 86.9 kg    Examination:  General exam: Coherent pleasant no distress Mallampati 4 Respiratory system: No rales no rhonchi diffuse coarse wheezes Cardiovascular system: S1-S2 slight tachycardic Gastrointestinal system: Soft nontender no rebound no guarding. Central nervous system: Neurologically intact no focal deficit Extremities: Soft no tenderness no lower extremity pain Skin: As above Psychiatry: Euthymic pleasant  Data Reviewed: I have personally reviewed following labs and imaging studies  COVID-19 Labs  Recent Labs    05/21/2020 1259 05/30/20 0339 05/30/20 0750 05/31/20 0329  DDIMER 1.04* 1.06*  --  1.06*  FERRITIN 339*  --  448* 624*  LDH 351*  --   --   --   CRP 26.5*  --  28.7* 18.5*    Lab Results    Component Value Date   SARSCOV2NAA POSITIVE (A) 05/15/2020   BUN/creatinine has climbed to 47/1.4-->54/1.2 Bicarb is 17 22 and resolving, anion gap is 14 LFTs are better Hemoglobin 13.6 White count 7.0   Radiology Studies: DG Chest Port 1 View  Result Date: 05/20/2020 CLINICAL DATA:  COVID-19 positive. Hypoxia. EXAM: PORTABLE CHEST 1 VIEW COMPARISON:  February 25, 2018 FINDINGS: Cardiomediastinal silhouette is normal. Mediastinal contours appear intact. There is no evidence of pleural effusion or pneumothorax. Bilateral patchy airspace consolidation with lower lobe predominance, worse in the left lower lung field, consistent with multifocal pneumonia. Osseous structures are without acute abnormality. Soft tissues are grossly normal. IMPRESSION: Bilateral patchy airspace consolidation with lower lobe predominance, worse in the left lower lung field, consistent with multifocal pneumonia. This may represent a viral pneumonia secondary to COVID-19 infection, however superimposed lobar pneumonia in the left lower lung field cannot be excluded. Electronically Signed   By: Fidela Salisbury M.D.   On: 06/10/2020 13:09     Scheduled Meds: . albuterol  2 puff Inhalation Q6H  . vitamin C  500 mg Oral Daily  . atorvastatin  20 mg Oral Daily  . Chlorhexidine Gluconate Cloth  6 each Topical Daily  . enoxaparin (LOVENOX) injection  40 mg Subcutaneous Q24H  . feeding supplement (ENSURE ENLIVE)  237 mL Oral BID BM  . insulin aspart  0-20 Units Subcutaneous TID WC  . insulin aspart  0-5 Units Subcutaneous QHS  . insulin aspart  6 Units Subcutaneous TID WC  . [START ON 06/01/2020] insulin glargine  20 Units Subcutaneous Daily  . mouth rinse  15 mL Mouth Rinse BID  . methylPREDNISolone (SOLU-MEDROL) injection  40 mg Intravenous Q12H  . pantoprazole  40 mg Oral Daily  . zinc sulfate  220 mg Oral Daily   Continuous Infusions: . lactated ringers 50 mL/hr at 05/31/20 1000  . remdesivir 100 mg in NS 100 mL  100 mg (05/31/20 1004)     LOS: 2 days    Time spent: 12 minutes  Nita Sells, MD [facility] To contact the attending provider between 7A-7P or the covering provider during after hours 7P-7A, please log into the web site [email or web address] and access using universal [place] password for that web site. If you do not have the password, please call the hospital operator.  05/31/2020, 10:46 AM

## 2020-06-01 ENCOUNTER — Telehealth: Payer: Self-pay | Admitting: Family Medicine

## 2020-06-01 ENCOUNTER — Encounter (HOSPITAL_COMMUNITY): Payer: Self-pay | Admitting: Internal Medicine

## 2020-06-01 DIAGNOSIS — U071 COVID-19: Secondary | ICD-10-CM | POA: Diagnosis not present

## 2020-06-01 DIAGNOSIS — J1282 Pneumonia due to Coronavirus disease 2019: Secondary | ICD-10-CM | POA: Diagnosis not present

## 2020-06-01 LAB — CBC WITH DIFFERENTIAL/PLATELET
Abs Immature Granulocytes: 0.13 10*3/uL — ABNORMAL HIGH (ref 0.00–0.07)
Basophils Absolute: 0 10*3/uL (ref 0.0–0.1)
Basophils Relative: 0 %
Eosinophils Absolute: 0 10*3/uL (ref 0.0–0.5)
Eosinophils Relative: 0 %
HCT: 41.5 % (ref 39.0–52.0)
Hemoglobin: 13.7 g/dL (ref 13.0–17.0)
Immature Granulocytes: 2 %
Lymphocytes Relative: 5 %
Lymphs Abs: 0.4 10*3/uL — ABNORMAL LOW (ref 0.7–4.0)
MCH: 31.3 pg (ref 26.0–34.0)
MCHC: 33 g/dL (ref 30.0–36.0)
MCV: 94.7 fL (ref 80.0–100.0)
Monocytes Absolute: 0.5 10*3/uL (ref 0.1–1.0)
Monocytes Relative: 6 %
Neutro Abs: 6.9 10*3/uL (ref 1.7–7.7)
Neutrophils Relative %: 87 %
Platelets: 229 10*3/uL (ref 150–400)
RBC: 4.38 MIL/uL (ref 4.22–5.81)
RDW: 13.6 % (ref 11.5–15.5)
WBC: 7.9 10*3/uL (ref 4.0–10.5)
nRBC: 0 % (ref 0.0–0.2)

## 2020-06-01 LAB — COMPREHENSIVE METABOLIC PANEL
ALT: 30 U/L (ref 0–44)
AST: 40 U/L (ref 15–41)
Albumin: 2.9 g/dL — ABNORMAL LOW (ref 3.5–5.0)
Alkaline Phosphatase: 54 U/L (ref 38–126)
Anion gap: 14 (ref 5–15)
BUN: 49 mg/dL — ABNORMAL HIGH (ref 8–23)
CO2: 24 mmol/L (ref 22–32)
Calcium: 8.8 mg/dL — ABNORMAL LOW (ref 8.9–10.3)
Chloride: 105 mmol/L (ref 98–111)
Creatinine, Ser: 1.19 mg/dL (ref 0.61–1.24)
GFR calc Af Amer: >60 mL/min (ref >60)
GFR calc non Af Amer: 58 mL/min — ABNORMAL LOW (ref >60)
Glucose, Bld: 253 mg/dL — ABNORMAL HIGH (ref 70–99)
Potassium: 4.4 mmol/L (ref 3.5–5.1)
Sodium: 143 mmol/L (ref 135–145)
Total Bilirubin: 0.8 mg/dL (ref 0.3–1.2)
Total Protein: 6.7 g/dL (ref 6.5–8.1)

## 2020-06-01 LAB — C-REACTIVE PROTEIN: CRP: 11.7 mg/dL — ABNORMAL HIGH (ref <1.0)

## 2020-06-01 LAB — D-DIMER, QUANTITATIVE: D-Dimer, Quant: 1.33 ug/mL-FEU — ABNORMAL HIGH (ref 0.00–0.50)

## 2020-06-01 LAB — FERRITIN: Ferritin: 485 ng/mL — ABNORMAL HIGH (ref 24–336)

## 2020-06-01 MED ORDER — LOPERAMIDE HCL 2 MG PO CAPS
2.0000 mg | ORAL_CAPSULE | Freq: Two times a day (BID) | ORAL | Status: DC
  Administered 2020-06-01 – 2020-06-02 (×2): 2 mg via ORAL
  Filled 2020-06-01 (×3): qty 1

## 2020-06-01 MED ORDER — LIP MEDEX EX OINT
TOPICAL_OINTMENT | CUTANEOUS | Status: AC
  Filled 2020-06-01: qty 7

## 2020-06-01 NOTE — Progress Notes (Signed)
Physical Therapy Evaluation Patient Details Name: Jim Clarke MRN: [id number] DOB: Sep 05, 1941 Today's Date: 06/01/2020  Clinical Impression Jim Clarke is 79 y.o. male admitted with for hypoxia and COVID PNA and above HPI. Patient is currently limited by functional impairments below (see PT problem list). Patient lives with his wife who is also positive for COVID-19 and is independent at baseline. Patient is currently requiring 15L/min via HFNC and 15L/min via NRB (30L total) to maint SpO2 in 80's during mobility. He required min assist for bed mob and sit<>stand transfer with 1HHA. Pt ambulated short distance in room and SpO2 dropped briefly into 70's but improved with standing rest. Patient attempted self proning but was unable to tolerate due to abdominal discomfort and required min assist to do so. He was able to tolerate Lt sidelying and was educated on importance of sidelying and rotating for lung function. Patient complete UE strengthening in sidelying and exercise band provided to complete periodically while restin gin bed. Patient will benefit from continued skilled PT interventions to address impairments and progress independence with mobility, recommending HHPT follow up once patient is medically stable to return home. Acute PT will follow and progress as able.    06/01/20 1400  PT Visit Information  Last PT Received On 06/01/20  Assistance Needed +1  History of Present Illness Patient is 79 y.o. male with PMH of hypertension, high cholesterol, diabetes who was admitted on 06/03/2020 from the [facility] infusion clinic after presenting SOB and being found to be hypoxic with SpO2 in 70's on RA. Patient initially reqiured 6L O2 via [place] and was gradually incresed to 15L/min HFNC. X-Ray suggestive of COVID related PNA. Pt now requiring HFNC and NRB to maintain saturations in 90's.  Precautions  Precautions Fall  Restrictions  Weight Bearing Restrictions No  Home Living  Family/patient expects to  be discharged to: Private residence  Living Arrangements Spouse/significant other  Available Help at Discharge Family  Type of [place] to enter  Entrance Stairs-Number of Steps 6  Entrance Stairs-Rails Can reach both  [place] Two level;Bed/bath upstairs  Alternate Level Stairs-Number of Steps 14-16  Alternate Level Stairs-Rails Right  [place] - single point  Prior Function  Level of Independence Independent  Comments pt retired and enjoys fishing.  Communication  Communication No difficulties  Pain Assessment  Pain Assessment No/denies pain  Cognition  Arousal/Alertness Awake/alert  Behavior During Therapy WFL for tasks assessed/performed  Overall Cognitive Status Within Functional Limits for tasks assessed  Upper Extremity Assessment  Upper Extremity Assessment Overall WFL for tasks assessed  Lower Extremity Assessment  Lower Extremity Assessment Overall WFL for tasks assessed  Cervical / Trunk Assessment  Cervical / Trunk Assessment Normal  Bed Mobility  Overal bed mobility Needs Assistance  Bed Mobility Supine to Sit;Rolling;Sit to Sidelying  Rolling Min assist  Supine to sit Min assist;HOB elevated  Sit to sidelying Min assist  General bed mobility comments cues for use of bed rail and assist to bring LE's off EOB. Assist to raise trunk fully. Pt attempted proning and required min assist to roll in bed, pt was unable to tolerate due to abdominal discomfort. EOS educated pt on benefits of remaining sidelying and rotating to improve lung function. Min assist to move from sit to Lt sidelying bed and edudcated pt to change position in 30-60 minutes. Pt complete exercises in sidelying for UE's.  Transfers  Overall transfer level Needs assistance  Equipment used 1 person  hand held assist;None  Transfers Sit to/from Stand  Sit to Stand Min assist  General transfer comment cues for safe technique to rise with 1HHA to steady. pt slightly  unsteady with rising but able to regain balance wtih min assist. pt noted to have slight Lt UE tremor.  Ambulation/Gait  Ambulation/Gait assistance Min assist  Gait Distance (Feet) 20 Feet  Assistive device 1 person hand held assist  Gait Pattern/deviations Step-through pattern;Decreased stride length  General Gait Details pt unsteady with gait and min assist required with HHA to prevent LOB. assist for line management from RN and PT. Pt on 15L via HFNC and 15L via NRB and SpO2 remaind from 82-88% during gait with one drop to 74-77%. HR in 100's.  Gait velocity decr  Balance  Overall balance assessment Needs assistance  Sitting-balance support Feet supported  Sitting balance-Leahy Scale Fair  Standing balance support During functional activity;Single extremity supported  Standing balance-Leahy Scale Fair  Exercises  Exercises Other exercises  Other Exercises  Other Exercises Rt UE exercise with orange resistance band: 10x tricep press, 10x bicep curl, 10 horizontal shoulder abd. (performed in Lt sidelying)  PT - End of Session  Equipment Utilized During Treatment Gait belt  Activity Tolerance Patient tolerated treatment well  Patient left in bed;with call bell/phone within reach;with bed alarm set  Nurse Communication Mobility status  PT Assessment  PT Recommendation/Assessment Patient needs continued PT services  PT Visit Diagnosis Unsteadiness on feet (R26.81);Muscle weakness (generalized) (M62.81);Difficulty in walking, not elsewhere classified (R26.2)  PT Problem List Decreased strength;Decreased activity tolerance;Decreased balance;Decreased mobility;Decreased knowledge of use of DME  PT Plan  PT Frequency (ACUTE ONLY) Min 3X/week  PT Treatment/Interventions (ACUTE ONLY) DME instruction;Gait training;Stair training;Functional mobility training;Therapeutic activities;Therapeutic exercise;Balance training;Patient/family education  AM-PAC PT "6 Clicks" Mobility Outcome Measure  (Version 2)  Help needed turning from your back to your side while in a flat bed without using bedrails? 3  Help needed moving from lying on your back to sitting on the side of a flat bed without using bedrails? 3  Help needed moving to and from a bed to a chair (including a wheelchair)? 3  Help needed standing up from a chair using your arms (e.g., wheelchair or bedside chair)? 3  Help needed to walk in hospital room? 3  Help needed climbing 3-5 steps with a railing?  2  6 Click Score 17  Consider Recommendation of Discharge To: Home with [facility]  PT Recommendation  Follow Up Recommendations Home health PT;Supervision for mobility/OOB  PT equipment Rolling walker with 5" wheels (pending progress may not need)  Individuals Consulted  Consulted and Agree with Results and Recommendations Patient  Acute Rehab PT Goals  Patient Stated Goal get back to fishing  PT Goal Formulation With patient  Time For Goal Achievement 06/15/20  Potential to Achieve Goals Good  PT Time Calculation  PT Start Time (ACUTE ONLY) 1348  PT Stop Time (ACUTE ONLY) 1424  PT Time Calculation (min) (ACUTE ONLY) 36 min  PT General Charges  $$ ACUTE PT VISIT 1 Visit  PT Evaluation  $PT Eval Moderate Complexity 1 Mod  PT Treatments  $Therapeutic Activity 8-22 mins  Written Expression  Dominant Hand Right     Verner Mould, DPT Acute Rehabilitation Services  Office [phone] Pager [phone]  06/01/2020 7:28 PM

## 2020-06-01 NOTE — Progress Notes (Signed)
PROGRESS NOTE  Jim Clarke  DTO:[id number] DOB: January 22, 1941 DOA: 05/18/2020 PCP: Denita Lung, MD  Brief Narrative:  38 WM Veteran Prior melanoma DM ty2, prior smoke [pft 12/19 dr Vaughan Browner no obst--thought GERD] with ?Nodule [disproven on subsq scans] BMI 30-htn hld  Admit with Covidd 43 from Infusion center on 7/17 initally 15l [place] needed crp 26, dimer 1 Rx Decadrom, remdisivir, actemra   Assessment & Plan:   Principal Problem:   Pneumonia due to COVID-19 virus Active Problems:   Hypertension associated with diabetes ([place])   Type 2 diabetes with complication ([place])   Gastroesophageal reflux disease without esophagitis   Acute hypoxemic respiratory failure ([place])   1. Covid 19 pneumonia with SIRS criterion/early sepsis evidenced by hypothermia, AKI E TC-severe with escalating hypoxia a. Covid pathway remdesivir/steroids/proning/high flow oxygen b. Actemra X1 7/17 and repeated 7/18 c. Risk factors = obesity, DM TY 2 BMI >31 d. 7/18 changed to Solu-Medrol 1 mg/k q12 e. High risk for decompensation-keep on stepdown f. X-ray from 717 not really much change would not add antibiotics at this stage g. Suspect he will need prolonged convalescence and will ask therapy to see him to ensure he does not weaken while he is here 2. hyperglycemia from Steroids superimposed/Dm ty2 A1c this admission 7.5 a. CBGs very elevated but have come down ranging now in the 250s b. Adding Lantus 12 units and increase to 20 units on 7/19 c. Placed on resistant scale and monitor trends 3. AKI with metabolic acidosis a. Component of hyperglycemia but also secondary to sepsis from Covid b. Acidosis and trends of AKI are improved c. Have started LR at 50 cc/h  d. discontinued lisinopril 5 4. htn a. Monitor trends-do not cover unless above 983 systolic 5. bmi 30 a. Stable 6. hld  7. Reflux  DVT prophylaxis: Lovenox prophylactic dosing Code Status: Full Family Communication: None  present Disposition:   Status is: Inpatient  Remains inpatient appropriate because:Hemodynamically unstable, Persistent severe electrolyte disturbances, Ongoing diagnostic testing needed not appropriate for outpatient work up, Unsafe d/c plan and IV treatments appropriate due to intensity of illness or inability to take PO   Dispo: The patient is from: Home              Anticipated d/c is to: Unclear at this time too early to say              Anticipated d/c date is: > 3 days              Patient currently is not medically stable to d/c.       Consultants:   None yet  Procedures: None  Antimicrobials: None   Subjective: Quite anxious this morning Did not get his breakfast and got worried about his oxygen levels No chest pain no fever 3 episodes of diarrhea since yesterday No chest pain   Objective: Vitals:   06/01/20 0400 06/01/20 0503 06/01/20 0800 06/01/20 0829  BP:  (!) 167/72    Pulse: 85 93    Resp: (!) 22 (!) 27    Temp: 98.7 F (37.1 C)  97.8 F (36.6 C)   TempSrc: Axillary  Axillary   SpO2: 91% 90%  91%  Weight:      Height:        Intake/Output Summary (Last 24 hours) at 06/01/2020 1213 Last data filed at 06/01/2020 1000 Gross per 24 hour  Intake 894.14 ml  Output 1225 ml  Net -330.86 ml   [employer]  05/14/2020 1600  Weight: 86.9 kg    Examination:  General exam: Coherent and little agitated today Respiratory system: No rales no rhonchi diffuse coarse wheezes Cardiovascular system: S1-S2 slight tachycardic sinus Gastrointestinal system: Soft nontender no rebound no guarding no organomegaly Central nervous system: Neurologically intact no focal deficit Extremities: Soft no tenderness no lower extremity pain Skin: As above Psychiatry: Euthymic pleasant  Data Reviewed: I have personally reviewed following labs and imaging studies  COVID-19 Labs  Recent Labs    06/11/2020 1259 05/24/2020 1259 05/30/20 0339 05/30/20 0750 05/31/20 0329  06/01/20 0245  DDIMER 1.04*   < > 1.06*  --  1.06* 1.33*  FERRITIN 339*   < >  --  448* 624* 485*  LDH 351*  --   --   --   --   --   CRP 26.5*   < >  --  28.7* 18.5* 11.7*   < > = values in this interval not displayed.    Lab Results  Component Value Date   SARSCOV2NAA POSITIVE (A) 05/17/2020   BUN/creatinine has climbed to 47/1.4-->54/1.2-->49/1.19 Bicarb is 17 22 and resolving, anion gap is 14 LFTs are better Hemoglobin 13.6 White count 7.0   Radiology Studies: No results found.   Scheduled Meds: . albuterol  2 puff Inhalation Q6H  . vitamin C  500 mg Oral Daily  . atorvastatin  20 mg Oral Daily  . Chlorhexidine Gluconate Cloth  6 each Topical Daily  . enoxaparin (LOVENOX) injection  40 mg Subcutaneous Q24H  . feeding supplement (ENSURE ENLIVE)  237 mL Oral BID BM  . insulin aspart  0-20 Units Subcutaneous TID WC  . insulin aspart  0-5 Units Subcutaneous QHS  . insulin aspart  6 Units Subcutaneous TID WC  . insulin glargine  20 Units Subcutaneous Daily  . insulin starter kit- pen needles  1 kit Other Once  . living well with diabetes book   Does not apply Once  . loperamide  2 mg Oral BID  . mouth rinse  15 mL Mouth Rinse BID  . methylPREDNISolone (SOLU-MEDROL) injection  40 mg Intravenous Q12H  . pantoprazole  40 mg Oral Daily  . zinc sulfate  220 mg Oral Daily   Continuous Infusions: . lactated ringers 50 mL/hr at 06/01/20 0600  . remdesivir 100 mg in NS 100 mL 100 mg (06/01/20 1213)     LOS: 3 days    Time spent: 30 minutes  Nita Sells, MD [facility] To contact the attending provider between 7A-7P or the covering provider during after hours 7P-7A, please log into the web site [email or web address] and access using universal [place] password for that web site. If you do not have the password, please call the hospital operator.  06/01/2020, 12:13 PM

## 2020-06-01 NOTE — Telephone Encounter (Signed)
P.A. hydrocodone syrup received, called pharmacy & pt paid out of pocket already picked up

## 2020-06-01 NOTE — TOC Initial Note (Signed)
Transition of Care [facility]) - Initial/Assessment Note    Patient Details  Name: Jim Clarke MRN: [id number] Date of Birth: [birth year]  [facility] CM/SW Contact:    Leeroy Cha, RN Phone Number: 06/01/2020, 10:13 AM  Clinical Narrative:                 Has pcp and is from home where he lives with his wife. + covid nrbhfnc at 30l/min, iv remdesivir through 510258.   Plan to return home once symptoms have stabilized.  Expected Discharge Plan: Home/Self Care Barriers to Discharge: Continued Medical Work up   Patient Goals and CMS Choice Patient states their goals for this hospitalization and ongoing recovery are:: to go home CMS Medicare.gov Compare Post Acute Care list provided to:: Patient    Expected Discharge Plan and Services Expected Discharge Plan: Home/Self Care   Discharge Planning Services: CM Consult   Living arrangements for the past 2 months: Single Family Home                                      Prior Living Arrangements/Services Living arrangements for the past 2 months: Single Family Home Lives with:: Spouse Patient language and need for interpreter reviewed:: Yes Do you feel safe going back to the place where you live?: Yes      Need for Family Participation in Patient Care: Yes (Comment) Care giver support system in place?: Yes (comment)   Criminal Activity/Legal Involvement Pertinent to Current Situation/Hospitalization: No - Comment as needed  Activities of Daily Living Home Assistive Devices/Equipment: None ADL Screening (condition at time of admission) Patient's cognitive ability adequate to safely complete daily activities?: Yes Is the patient deaf or have difficulty hearing?: No Does the patient have difficulty seeing, even when wearing glasses/contacts?: No Does the patient have difficulty concentrating, remembering, or making decisions?: No Patient able to express need for assistance with ADLs?: Yes Does the  patient have difficulty dressing or bathing?: No Independently performs ADLs?: Yes (appropriate for developmental age) Does the patient have difficulty walking or climbing stairs?: No Weakness of Legs: Both Weakness of Arms/Hands: None  Permission Sought/Granted                  Emotional Assessment Appearance:: Appears stated age Attitude/Demeanor/Rapport: Engaged Affect (typically observed): Calm Orientation: : Oriented to Place, Oriented to Self, Oriented to  Time, Oriented to Situation Alcohol / Substance Use: Not Applicable Psych Involvement: No (comment)  Admission diagnosis:  Acute respiratory failure with hypoxia ([place]) [J96.01] Acute hypoxemic respiratory failure ([place]) [J96.01] COVID-19 [U07.1] Patient Active Problem List   Diagnosis Date Noted  . Acute hypoxemic respiratory failure ([place]) 05/19/2020  . Pneumonia due to COVID-19 virus 05/30/2020  . History of melanoma 03/31/2020  . Gastroesophageal reflux disease without esophagitis 03/31/2020  . Family history of colon cancer 03/31/2020  . Hyperplastic polyp of large intestine 08/30/2018  . Former smoker 02/25/2018  . Atherosclerosis 02/01/2017  . Hyperlipidemia associated with type 2 diabetes mellitus ([place]) 07/01/2012  . Type 2 diabetes with complication ([place]) 52/77/8242  . Hypertension associated with diabetes ([place]) 06/27/2011  . ED (erectile dysfunction) 06/27/2011   PCP:  Denita Lung, MD Pharmacy:   [facility] [place]), [place] - [address] [place] [address] Phone: [phone] Fax: [phone]  [facility] [place]  [address] Portal to Registered [place] [address] Phone: [phone] Fax: [phone]  [id number] [place], [place] - [address] [place] [address] Phone: [phone] Fax:  [phone]     Social Determinants of Health (SDOH) Interventions    Readmission Risk Interventions No flowsheet data found.

## 2020-06-02 ENCOUNTER — Telehealth: Payer: Self-pay | Admitting: Family Medicine

## 2020-06-02 DIAGNOSIS — J9601 Acute respiratory failure with hypoxia: Secondary | ICD-10-CM | POA: Diagnosis not present

## 2020-06-02 DIAGNOSIS — E1159 Type 2 diabetes mellitus with other circulatory complications: Secondary | ICD-10-CM | POA: Diagnosis not present

## 2020-06-02 DIAGNOSIS — U071 COVID-19: Secondary | ICD-10-CM | POA: Diagnosis not present

## 2020-06-02 DIAGNOSIS — K219 Gastro-esophageal reflux disease without esophagitis: Secondary | ICD-10-CM | POA: Diagnosis not present

## 2020-06-02 LAB — BLOOD GAS, ARTERIAL
Acid-Base Excess: 0 mmol/L (ref 0.0–2.0)
Bicarbonate: 23.9 mmol/L (ref 20.0–28.0)
FIO2: 100
O2 Saturation: 89.7 %
Patient temperature: 98.1
pCO2 arterial: 38 mmHg (ref 32.0–48.0)
pH, Arterial: 7.415 (ref 7.350–7.450)
pO2, Arterial: 61.3 mmHg — ABNORMAL LOW (ref 83.0–108.0)

## 2020-06-02 LAB — FERRITIN: Ferritin: 342 ng/mL — ABNORMAL HIGH (ref 24–336)

## 2020-06-02 LAB — GLUCOSE, CAPILLARY
Glucose-Capillary: 168 mg/dL — ABNORMAL HIGH (ref 70–99)
Glucose-Capillary: 231 mg/dL — ABNORMAL HIGH (ref 70–99)
Glucose-Capillary: 185 mg/dL — ABNORMAL HIGH (ref 70–99)

## 2020-06-02 LAB — D-DIMER, QUANTITATIVE: D-Dimer, Quant: 2.03 ug/mL-FEU — ABNORMAL HIGH (ref 0.00–0.50)

## 2020-06-02 LAB — C-REACTIVE PROTEIN: CRP: 7.8 mg/dL — ABNORMAL HIGH (ref <1.0)

## 2020-06-02 MED ORDER — LORAZEPAM 2 MG/ML IJ SOLN
1.0000 mg | Freq: Four times a day (QID) | INTRAMUSCULAR | Status: DC | PRN
  Administered 2020-06-02 – 2020-06-03 (×2): 1 mg via INTRAVENOUS
  Administered 2020-06-03: 2 mg via INTRAVENOUS
  Filled 2020-06-02 (×2): qty 1

## 2020-06-02 MED ORDER — LORAZEPAM 2 MG/ML IJ SOLN
2.0000 mg | Freq: Once | INTRAMUSCULAR | Status: AC
  Administered 2020-06-02: 2 mg via INTRAVENOUS
  Filled 2020-06-02: qty 1

## 2020-06-02 MED ORDER — HALOPERIDOL LACTATE 5 MG/ML IJ SOLN: 5.0000 mg | Freq: Once | INTRAMUSCULAR | Status: AC

## 2020-06-02 MED ORDER — LORAZEPAM 2 MG/ML IJ SOLN
INTRAMUSCULAR | Status: AC
  Administered 2020-06-02: 1 mg via INTRAVENOUS
  Filled 2020-06-02: qty 1

## 2020-06-02 MED ORDER — HALOPERIDOL LACTATE 5 MG/ML IJ SOLN
INTRAMUSCULAR | Status: AC
  Administered 2020-06-02: 5 mg
  Filled 2020-06-02: qty 1

## 2020-06-02 MED ORDER — DEXMEDETOMIDINE HCL IN NACL 200 MCG/50ML IV SOLN
0.4000 ug/kg/h | INTRAVENOUS | Status: DC
  Administered 2020-06-02: 0.4 ug/kg/h via INTRAVENOUS
  Administered 2020-06-02 (×2): 0.2 ug/kg/h via INTRAVENOUS
  Administered 2020-06-03: 0.7 ug/kg/h via INTRAVENOUS
  Administered 2020-06-03: 0.3 ug/kg/h via INTRAVENOUS
  Administered 2020-06-03 (×2): 0.6 ug/kg/h via INTRAVENOUS
  Administered 2020-06-03: 0.4 ug/kg/h via INTRAVENOUS
  Administered 2020-06-04: 1 ug/kg/h via INTRAVENOUS
  Filled 2020-06-02: qty 50
  Filled 2020-06-02: qty 100
  Filled 2020-06-02 (×7): qty 50

## 2020-06-02 MED ORDER — LORAZEPAM 2 MG/ML IJ SOLN
1.0000 mg | Freq: Once | INTRAMUSCULAR | Status: AC
  Administered 2020-06-02: 1 mg via INTRAVENOUS
  Filled 2020-06-02: qty 1

## 2020-06-02 MED ORDER — LINAGLIPTIN 5 MG PO TABS
5.0000 mg | ORAL_TABLET | Freq: Every day | ORAL | Status: DC
  Administered 2020-06-02 – 2020-06-03 (×2): 5 mg via ORAL
  Filled 2020-06-02 (×3): qty 1

## 2020-06-02 MED ORDER — HALOPERIDOL LACTATE 5 MG/ML IJ SOLN: 2.0000 mg | Freq: Once | INTRAMUSCULAR | Status: DC

## 2020-06-02 NOTE — Progress Notes (Signed)
Pt became combative and confused, interfering in care, getting out of bed.  No significant abnormalities in ABG.  No improvement in confusion with Ativan and Haldol and patient was requiring multiple staff members and 4 security guards to care for him and keep him in bed.  Precedex started with minimal improvement.  Required 4 point restraints.

## 2020-06-02 NOTE — Progress Notes (Signed)
Wife and daughter returned cellphone and charger, placed at patient bedside.

## 2020-06-02 NOTE — Progress Notes (Signed)
Soil scientist given to wife and daughter at [facility] main entrance. Verbal update given/reinforced from previous conversation.

## 2020-06-02 NOTE — Progress Notes (Signed)
PROGRESS NOTE  Jim Clarke  SHF:[id number] DOB: 1941/06/15 DOA: 05/30/2020 PCP: Denita Lung, MD   Brief Narrative: Jim Clarke is a 79 y.o. male [place] with a history of T2DM, obesity (BMI 30), HTN, HLD, tobacco use (w/o obstruction on PFTs Dec 2019), GERD who presented to the ED 7/17 from the infusion center with hypoxia into 70%'s. Needed 6L then 10L in ED, ultimately up to 15L HFNC. CRP severely elevated at 26.5, SARS-CoV-2 testing confirmed positive. CXR demonstrated patchy bilateral infiltrates. Decadron and remdesivir started, patient admitted to SDU.   Wife +, got infusion Dx 2 weeks prior to him testing 7/16 after virtual visit.  Sxs onset 7/8: Had cough, headache, fever up to 101.6, no appetite, myalgias, loss of smell and taste.  Reportedly was vaccinated w/pfizer in May 2021?   Assessment & Plan: Principal Problem:   Pneumonia due to COVID-19 virus Active Problems:   Hypertension associated with diabetes ([place])   Type 2 diabetes with complication (HCC)   Gastroesophageal reflux disease without esophagitis   Acute hypoxemic respiratory failure (HCC)  Acute hypoxemic respiratory failure due to covid-19 pneumonia: SARS-CoV-2 PCR positive on 7/17, symptoms started 7/8.  - Reportedly vaccinated. Will check IgG. - Continue supplemental oxygen at current level. Inflammatory markers coming down well. Repeat CXR in AM - s/p tocilizumab 7/17, 7/18.  - Complete remdesivir x5 days 7/17 - 7/21 - Steroids x10 days - Vitamin C, zinc - Encourage OOB, IS, FV, and awake proning if able - Tylenol and antitussives prn - Continue airborne, contact precautions for 21 days from positive testing. - Check CBC w/diff, CMP, CRP again in AM - Enoxaparin prophylactic dose.  - Maintain euvolemia/net negative.  - Avoid NSAIDs   Agitated delirium:  - Minimize restraints but will use to maintain patient safety.  - Ativan 1-2mg  IV prn - Wean precedex as able based on agitation and as limited  by bradycardia.  Steroid-induced hyperglycemia, NIDT2DM: HbA1c 7.5%. - Add linagliptin - Continue SSI  Wheezing: No obstruction on PFTs in 2019, former smoker.  - MDI to be used, at bedside.  Obesity: Estimated body mass index is 30.01 kg/m as calculated from the following:   Height as of this encounter: 5\' 7"  (1.702 m).   Weight as of this encounter: 86.9 kg.  DVT prophylaxis: Lovenox Code Status: Full Family Communication: None at bedside Disposition Plan:  Status is: Inpatient  Remains inpatient appropriate because:Inpatient level of care appropriate due to severity of illness   Dispo: The patient is from: Home              Anticipated d/c is to: TBD              Anticipated d/c date is: > 3 days              Patient currently is not medically stable to d/c.  Consultants:   None  Procedures:   None  Antimicrobials:  Remdesivir 7/17 - 7/21   Subjective: Shortness of breath is stable, severe with associated desaturations with any exertion. Was delirious overnight, agitated, getting out of bed with resultant SpO2 into 60%'s. Started on precedex when ativan and haldol ineffective.  Objective: Vitals:   06/02/20 0600 06/02/20 0700 06/02/20 0748 06/02/20 0807  BP:   (!) 135/51   Pulse: (!) 52 (!) 47 (!) 49   Resp: 17 (!) 21 18   Temp:      TempSrc:      SpO2: 91% (!) 85% Marland Kitchen)  88% 92%  Weight:      Height:        Intake/Output Summary (Last 24 hours) at 06/02/2020 0848 Last data filed at 06/02/2020 6269 Gross per 24 hour  Intake 2124.99 ml  Output 1575 ml  Net 549.99 ml   Filed Weights   06/08/2020 1600  Weight: 86.9 kg    Gen: 79 y.o. male in no distress on my evaluation.  Pulm: Non-labored but tachypneic with bilateral expiratory wheezing. Diminished throughout.  CV: Regular rate and rhythm. No murmur, rub, or gallop. No JVD, no significant pedal edema. GI: Abdomen soft, non-tender, non-distended, with normoactive bowel sounds. No organomegaly or masses  felt. Ext: Warm, no deformities Skin: No rashes, lesions or ulcers Neuro: Drowsy but rousable and oriented. No focal neurological deficits. Psych: Judgement and insight appear normal. Mood & affect appropriate.   Data Reviewed: I have personally reviewed following labs and imaging studies  CBC: Recent Labs  Lab 05/17/2020 1259 06/06/2020 1622 05/30/20 0339 05/31/20 0854 06/01/20 0245  WBC 7.8 7.1 4.7 7.0 7.9  NEUTROABS 7.0  --   --  6.1 6.9  HGB 14.0 13.8 13.2 13.6 13.7  HCT 42.8 41.8 40.6 40.8 41.5  MCV 97.3 97.0 95.3 95.1 94.7  PLT 163 153 172 212 485   Basic Metabolic Panel: Recent Labs  Lab 05/24/2020 1259 06/04/2020 1259 06/10/2020 1622 05/30/20 0339 05/30/20 1905 05/31/20 0854 06/01/20 0245  NA 139  --   --  141 139 138 143  K 5.0  --   --  4.5 4.3 4.5 4.4  CL 99  --   --  101 102 102 105  CO2 23  --   --  17* 23 22 24   GLUCOSE 225*  --   --  300* 446* 371* 253*  BUN 33*  --   --  47* 59* 54* 49*  CREATININE 1.71*   < > 1.70* 1.42* 1.49* 1.24 1.19  CALCIUM 8.3*  --   --  8.4* 8.5* 8.6* 8.8*   < > = values in this interval not displayed.   GFR: Estimated Creatinine Clearance: 53.8 mL/min (by C-G formula based on SCr of 1.19 mg/dL). Liver Function Tests: Recent Labs  Lab 06/07/2020 1259 05/30/20 0339 05/31/20 0854 06/01/20 0245  AST 43* 51* 36 40  ALT 28 38 32 30  ALKPHOS 46 49 51 54  BILITOT 0.9 0.8 0.9 0.8  PROT 7.3 7.4 6.9 6.7  ALBUMIN 3.2* 3.1* 2.9* 2.9*   No results for input(s): LIPASE, AMYLASE in the last 168 hours. No results for input(s): AMMONIA in the last 168 hours. Coagulation Profile: No results for input(s): INR, PROTIME in the last 168 hours. Cardiac Enzymes: No results for input(s): CKTOTAL, CKMB, CKMBINDEX, TROPONINI in the last 168 hours. BNP (last 3 results) No results for input(s): PROBNP in the last 8760 hours. HbA1C: Recent Labs    05/31/20 1049  HGBA1C 7.5*   CBG: Recent Labs  Lab 05/13/2020 1637 06/08/2020 2157 05/30/20 0756    GLUCAP 249* 276* 248*   Lipid Profile: No results for input(s): CHOL, HDL, LDLCALC, TRIG, CHOLHDL, LDLDIRECT in the last 72 hours. Thyroid Function Tests: No results for input(s): TSH, T4TOTAL, FREET4, T3FREE, THYROIDAB in the last 72 hours. Anemia Panel: Recent Labs    06/01/20 0245 06/02/20 0217  FERRITIN 485* 342*   Urine analysis:    Component Value Date/Time   COLORURINE YELLOW 05/28/2012 [place] 05/28/2012 1055   LABSPEC 1.018 05/28/2012 1055  PHURINE 5.5 05/28/2012 1055   GLUCOSEU NEGATIVE 05/28/2012 1055   HGBUR NEGATIVE 05/28/2012 1055   BILIRUBINUR NEGATIVE 05/28/2012 1055   KETONESUR 15 (A) 05/28/2012 1055   PROTEINUR NEGATIVE 05/28/2012 1055   UROBILINOGEN 0.2 05/28/2012 1055   NITRITE NEGATIVE 05/28/2012 1055   LEUKOCYTESUR NEGATIVE 05/28/2012 1055   Recent Results (from the past 240 hour(s))  SARS Coronavirus 2 by RT PCR (hospital order, performed in [facility] hospital lab) Nasopharyngeal Nasopharyngeal Swab     Status: Abnormal   Collection Time: 06/01/2020 12:59 PM   Specimen: Nasopharyngeal Swab  Result Value Ref Range Status   SARS Coronavirus 2 POSITIVE (A) NEGATIVE Final    Comment: RESULT CALLED TO, READ BACK BY AND VERIFIED WITH: Samul Dada RN [id number] 06/03/2020 JM (NOTE) SARS-CoV-2 target nucleic acids are DETECTED  SARS-CoV-2 RNA is generally detectable in upper respiratory specimens  during the acute phase of infection.  Positive results are indicative  of the presence of the identified virus, but do not rule out bacterial infection or co-infection with other pathogens not detected by the test.  Clinical correlation with patient history and  other diagnostic information is necessary to determine patient infection status.  The expected result is negative.  Fact Sheet for Patients:   StrictlyIdeas.no   Fact Sheet for Healthcare Providers:   BankingDealers.co.za    This test is not  yet approved or cleared by the [place] FDA and  has been authorized for detection and/or diagnosis of SARS-CoV-2 by FDA under an Emergency Use Authorization (EUA).  This EUA will remain in effect (meaning this test can  be used) for the duration of  the COVID-19 declaration under Section 564(b)(1) of the Act, 21 U.S.C. section 360-bbb-3(b)(1), unless the authorization is terminated or revoked sooner.  Performed at [facility], [place] [address], [place], [place] [address]   Blood Culture (routine x 2)     Status: None (Preliminary result)   Collection Time: 06/04/2020 12:59 PM   Specimen: BLOOD  Result Value Ref Range Status   Specimen Description   Final    BLOOD BLOOD LEFT FOREARM Performed at [facility] [address], [place], [place] [address]    Special Requests   Final    BOTTLES DRAWN AEROBIC AND ANAEROBIC Blood Culture adequate volume Performed at [facility] [address], [place], [place] [address]    Culture   Final    NO GROWTH 4 DAYS Performed at [facility], [place] [address] [place], [place] [address]    Report Status PENDING  Incomplete  Blood Culture (routine x 2)     Status: None (Preliminary result)   Collection Time: 05/22/2020 12:59 PM   Specimen: BLOOD  Result Value Ref Range Status   Specimen Description   Final    BLOOD LEFT HAND Performed at [facility] [address] [place], [place] [address]    Special Requests   Final    BOTTLES DRAWN AEROBIC AND ANAEROBIC Blood Culture results may not be optimal due to an inadequate volume of blood received in culture bottles Performed at [facility] [address], [place], [place] [address]    Culture   Final    NO GROWTH 4 DAYS Performed at [facility], [place] [address] [place],  [address]    Report Status PENDING  Incomplete  MRSA PCR Screening     Status: None   Collection Time:  05/30/2020  4:51 PM   Specimen: Nasal Mucosa; Nasopharyngeal  Result Value Ref Range Status   MRSA by PCR NEGATIVE NEGATIVE Final    Comment:        The GeneXpert MRSA Assay (FDA approved for NASAL specimens only), is one component of a comprehensive MRSA colonization surveillance program. It is not intended to diagnose MRSA infection nor to guide or monitor treatment for MRSA infections. Performed at [facility], [place] [address] [place], [place] [address]       Radiology Studies: No results found.  Scheduled Meds:  albuterol  2 puff Inhalation Q6H   vitamin C  500 mg Oral Daily   atorvastatin  20 mg Oral Daily   Chlorhexidine Gluconate Cloth  6 each Topical Daily   enoxaparin (LOVENOX) injection  40 mg Subcutaneous Q24H   feeding supplement (ENSURE ENLIVE)  237 mL Oral BID BM   insulin aspart  0-20 Units Subcutaneous TID WC   insulin aspart  0-5 Units Subcutaneous QHS   insulin aspart  6 Units Subcutaneous TID WC   insulin glargine  20 Units Subcutaneous Daily   loperamide  2 mg Oral BID   mouth rinse  15 mL Mouth Rinse BID   methylPREDNISolone (SOLU-MEDROL) injection  40 mg Intravenous Q12H   pantoprazole  40 mg Oral Daily   zinc sulfate  220 mg Oral Daily   Continuous Infusions:  dexmedetomidine (PRECEDEX) IV infusion 0.3 mcg/kg/hr (06/02/20 0821)   lactated ringers 50 mL/hr at 06/02/20 2060   remdesivir 100 mg in NS 100 mL Stopped (06/01/20 1243)     LOS: 4 days   Time spent: 35 minutes.  Patrecia Pour, MD [facility] [email or web address] 06/02/2020, 8:48 AM

## 2020-06-02 NOTE — Telephone Encounter (Signed)
Wife called, she is on HIPAA.  She is very concerned about her husband.  He is in the Covid unit at [facility].  She states his phone is not working, she has called the nurse and she was busy and hasn't called her back.  She said when she talked with her husband yesterday, he was very upset that they were not feeding him, they have put him on steroids and insulin by needle.  He didn't get his breakfast until after 10:00.  Wife is concerned they are not caring for him because he is so agitated, confused and pulling out his IV's, that is the only info she received from the nurse this morning.    I assured the wife that the hospital is used to working with patients that are agitated, that would be normal for patients that are sick, scared and in the hospital.  I assured her that her husband was being taking care of.  I asked her to give the nurse and hour to call her back as it had only been 30 minutes and she was frantic.  I asked her to make a list of questions/concerns for the nurse, so she will be ready when nurse calls back and she can also request a call from the doctor.    Wife wanted to know, who she could call to help.  I gave her the patient experience number [phone].  Wife wanted to make sure Dr Redmond School aware.

## 2020-06-02 NOTE — Progress Notes (Signed)
During assessment and medication administration, attempted to provide oral hydration to patient. He only took one or two sips of water and did not want anything else. Became combative when attempting to take blood sugar even though RN told him what was going to be done. Ativan 1 mg given at that time.

## 2020-06-02 NOTE — Progress Notes (Signed)
Patient became confused and combative overnight. He was trying to get out bed and refused to keep oxygen mask on. Pt was defensive and verbally aggressive with staff. 1 mg Ativan ad 5 mg Haldol given which were ineffective. Patient became physically aggressive with staff and was placed in four point restraints. Pt then started on Precedex gtt which was effective.

## 2020-06-02 NOTE — Progress Notes (Signed)
Spoke with wife Jim Clarke at [employer] regarding husbands current status, daughter joined call as well. Reviewed current plan of care regarding patient stay, safety precautions, current oxygen demands and continued agitation/confusion. Wife/daughter verbalized understanding and reports patient is "used to a schedule", discussed/educated family on possible causes of confusion and potential effects that Covid/increased O2 demands can have on a patients mental state. I assured family that patient was being well taking care of and was in a safe situation. Patient was very confused/agitated overnight and did not get much rest, continued on this RNs shift this AM, patient is currently resting in bed asleep, VSS. Discussed AM events on 06/02/20 in which patient required this bedside RNs attention and I was unable to update family at that time, wife verbalized understanding.   Patient was able to eat several bites of his lunch while oxygen levels remained stable, able to drink ensure per patient request. Direct RN number given to wife/daughter to call for updates for the remainder of the shift.   Family had questions regarding visitation d/t patient confusion/agitation, reviewed visitation policy, no visitors are allowed at this time, v/u. Family plans to come and pick up patients phone to ensure adequate minutes are on phone for future communication once able.  Family verbalized understanding of situation, call ended.

## 2020-06-02 NOTE — Progress Notes (Signed)
OT Cancellation Note  Patient Details Name: Jim Clarke MRN: [id number] DOB: 1941/03/25   Cancelled Treatment:    Reason Eval/Treat Not Completed: Other (comment) RN requests therapist hold. Patient in restraints, agitated and combative at times. Will f/u as able.  [name] 06/02/2020, 4:38 PM

## 2020-06-02 NOTE — TOC Progression Note (Signed)
Transition of Care [facility]) - Progression Note    Patient Details  Name: Jim Clarke MRN: [id number] Date of Birth: 08-12-1941  Transition of Care [facility]) CM/SW Contact  Leeroy Cha, RN Phone Number: 06/02/2020, 8:11 AM  Clinical Narrative:    s-became confused and aggressive overnight, placed on iv precedex drip. Following for toc needs.   Expected Discharge Plan: Home/Self Care Barriers to Discharge: Continued Medical Work up  Expected Discharge Plan and Services Expected Discharge Plan: Home/Self Care   Discharge Planning Services: CM Consult   Living arrangements for the past 2 months: Single Family Home                                       Social Determinants of Health (SDOH) Interventions    Readmission Risk Interventions No flowsheet data found.

## 2020-06-03 ENCOUNTER — Inpatient Hospital Stay (HOSPITAL_COMMUNITY): Payer: Medicare HMO

## 2020-06-03 DIAGNOSIS — E1159 Type 2 diabetes mellitus with other circulatory complications: Secondary | ICD-10-CM | POA: Diagnosis not present

## 2020-06-03 DIAGNOSIS — K219 Gastro-esophageal reflux disease without esophagitis: Secondary | ICD-10-CM | POA: Diagnosis not present

## 2020-06-03 DIAGNOSIS — U071 COVID-19: Secondary | ICD-10-CM | POA: Diagnosis not present

## 2020-06-03 DIAGNOSIS — J9601 Acute respiratory failure with hypoxia: Secondary | ICD-10-CM | POA: Diagnosis not present

## 2020-06-03 LAB — CBC
HCT: 45.1 % (ref 39.0–52.0)
Hemoglobin: 14.7 g/dL (ref 13.0–17.0)
MCH: 31.3 pg (ref 26.0–34.0)
MCHC: 32.6 g/dL (ref 30.0–36.0)
MCV: 96.2 fL (ref 80.0–100.0)
Platelets: 221 10*3/uL (ref 150–400)
RBC: 4.69 MIL/uL (ref 4.22–5.81)
RDW: 13.9 % (ref 11.5–15.5)
WBC: 8.3 10*3/uL (ref 4.0–10.5)
nRBC: 0 % (ref 0.0–0.2)

## 2020-06-03 LAB — SAR COV2 SEROLOGY (COVID19)AB(IGG),IA: SARS-CoV-2 Ab, IgG: REACTIVE — AB

## 2020-06-03 LAB — CULTURE, BLOOD (ROUTINE X 2)
Culture: NO GROWTH
Culture: NO GROWTH
Special Requests: ADEQUATE

## 2020-06-03 LAB — COMPREHENSIVE METABOLIC PANEL
ALT: 23 U/L (ref 0–44)
AST: 36 U/L (ref 15–41)
Albumin: 2.8 g/dL — ABNORMAL LOW (ref 3.5–5.0)
Alkaline Phosphatase: 63 U/L (ref 38–126)
Anion gap: 12 (ref 5–15)
BUN: 36 mg/dL — ABNORMAL HIGH (ref 8–23)
CO2: 24 mmol/L (ref 22–32)
Calcium: 8.7 mg/dL — ABNORMAL LOW (ref 8.9–10.3)
Chloride: 108 mmol/L (ref 98–111)
Creatinine, Ser: 1.16 mg/dL (ref 0.61–1.24)
GFR calc Af Amer: >60 mL/min (ref >60)
GFR calc non Af Amer: 60 mL/min — ABNORMAL LOW (ref >60)
Glucose, Bld: 145 mg/dL — ABNORMAL HIGH (ref 70–99)
Potassium: 4.5 mmol/L (ref 3.5–5.1)
Sodium: 144 mmol/L (ref 135–145)
Total Bilirubin: 0.9 mg/dL (ref 0.3–1.2)
Total Protein: 6.5 g/dL (ref 6.5–8.1)

## 2020-06-03 LAB — D-DIMER, QUANTITATIVE: D-Dimer, Quant: 3.88 ug/mL-FEU — ABNORMAL HIGH (ref 0.00–0.50)

## 2020-06-03 LAB — GLUCOSE, CAPILLARY
Glucose-Capillary: 154 mg/dL — ABNORMAL HIGH (ref 70–99)
Glucose-Capillary: 88 mg/dL (ref 70–99)
Glucose-Capillary: 105 mg/dL — ABNORMAL HIGH (ref 70–99)

## 2020-06-03 LAB — FERRITIN: Ferritin: 309 ng/mL (ref 24–336)

## 2020-06-03 LAB — C-REACTIVE PROTEIN: CRP: 4.5 mg/dL — ABNORMAL HIGH (ref <1.0)

## 2020-06-03 MED ORDER — ENOXAPARIN SODIUM 40 MG/0.4ML ~~LOC~~ SOLN
40.0000 mg | Freq: Two times a day (BID) | SUBCUTANEOUS | Status: DC
  Administered 2020-06-03 (×2): 40 mg via SUBCUTANEOUS
  Filled 2020-06-03 (×2): qty 0.4

## 2020-06-03 MED ORDER — FUROSEMIDE 10 MG/ML IJ SOLN
40.0000 mg | Freq: Once | INTRAMUSCULAR | Status: AC
  Administered 2020-06-03: 40 mg via INTRAVENOUS
  Filled 2020-06-03: qty 4

## 2020-06-03 MED ORDER — METHYLPREDNISOLONE SODIUM SUCC 125 MG IJ SOLR
80.0000 mg | Freq: Two times a day (BID) | INTRAMUSCULAR | Status: DC
  Administered 2020-06-03 (×2): 80 mg via INTRAVENOUS
  Filled 2020-06-03 (×2): qty 2

## 2020-06-03 NOTE — Progress Notes (Signed)
OT Cancellation Note  Patient Details Name: Jim Clarke MRN: [id number] DOB: [birth year]   Cancelled Treatment:    Reason Eval/Treat Not Completed: Patient's level of consciousness Attempted therapy evaluation today. Patient won't able eyes, does mumble some responses. Patient restrained. Patient's NRB slipped and therapist attempted to push up on nose - which resulted in patient aggressively moving his bed back and forth resisting NRB placement. Therapist able to get NRB mask in place and patient settled down without stimuli. Patient's mentation is not appropriate for therapy evaluation at this time. Will continue to follow and evaluate when patient's cognition and alertness improved.  Jaleeyah Munce L Anae Hams 06/03/2020, 3:53 PM

## 2020-06-03 NOTE — Progress Notes (Signed)
[facility] Progress Note Patient Name: Jim Clarke DOB: 11/06/1941 MRN: [id number]   Date of Service  06/03/2020  HPI/Events of Note  Patient with Covid-19 pneumonia and hypoxemic respiratory failure.  Patient is on [place] and non re-breather mask with saturation in the high 80's to low 90's, he is at high risk for requiring emergent intubation.  eICU Interventions  Patient to be monitored closely for deterioration necessitating intubation, continue current Rx.        Kerry Kass Tosha Belgarde 06/03/2020, 10:24 PM

## 2020-06-03 NOTE — Progress Notes (Signed)
Pt's daughter called to unit. Updated of pt condition in length. Answered her questions about pt's medications, vital signs, nutritional status and plan of care. Pt's daughter verbalized understanding and is in agreement with our plan. States that she wants to receive update from MD. Dr. Bonner Puna paged and notified.

## 2020-06-03 NOTE — Progress Notes (Signed)
Updated patient's wife about restraints being applied this morning and overall care from night shift. Wife stated she appreciated my waiting to call until now and for information/update.

## 2020-06-03 NOTE — Progress Notes (Signed)
PROGRESS NOTE  [name]  KGM:[id number] DOB: Mar 25, 1941 DOA: 05/23/2020 PCP: Denita Lung, MD   Brief Narrative: Jim Clarke is a 79 y.o. male [place] with a history of T2DM, obesity (BMI 30), HTN, HLD, tobacco use (w/o obstruction on PFTs Dec 2019), GERD who presented to the ED 7/17 from the infusion center with hypoxia into 70%'s. Needed 6L then 10L in ED, ultimately up to 15L HFNC. CRP severely elevated at 26.5, SARS-CoV-2 testing confirmed positive. CXR demonstrated patchy bilateral infiltrates. Decadron and remdesivir started, patient admitted to SDU.   Wife +, got infusion Dx 2 weeks prior to him testing 7/16 after virtual visit.  Sxs onset 7/8: Had cough, headache, fever up to 101.6, no appetite, myalgias, loss of smell and taste.  Reportedly was vaccinated w/pfizer in May 2021?   Assessment & Plan: Principal Problem:   Pneumonia due to COVID-19 virus Active Problems:   Hypertension associated with diabetes ([place])   Type 2 diabetes with complication (HCC)   Gastroesophageal reflux disease without esophagitis   Acute hypoxemic respiratory failure (HCC)  Acute hypoxemic respiratory failure due to covid-19 pneumonia: SARS-CoV-2 PCR positive on 7/17, symptoms started 7/8. Lymphopenia noted.  - CXR this AM personally reviewed showing severe bilateral airspace opacities that have worsened considerably. I have consulted PCCM for further recommendations. - Reportedly vaccinated and IgG serology was reactive on 7/21. This suggests adequate immunological response to vaccination and active covid infection despite that. Health department notified.  - Continue supplemental oxygen at current level. Inflammatory markers continue improvement though infiltrates progressing. Will augment steroids to total 2mg /kg/day div. BID.  - s/p tocilizumab 7/17, 7/18.  - Completed remdesivir 7/17 - 7/21 - Encourage proning if able - Tylenol and antitussives prn - Continue airborne, contact precautions  for 21 days from positive testing. - Aiming to remain euvolemic, continue LR at low rate and hope for improved nutritional status with improvement in agitated delirium.  Elevated d-dimer: Elevation expected with covid, though rising despite improvement in CRP is noted.  - Augment lovenox to 40mg  q12h empirically, continue trending d-dimer. Check LE U/S. Infiltrates on CXR are sufficient explanation for this level of hypoxemia, so not pulling trigger on CTA at this time.   Agitated delirium:  - Minimize restraints but will use to maintain patient safety.  - Ativan 1-2mg  IV prn - Wean precedex as able based on agitation and as limited by bradycardia. - Would appreciate PCCM assistance in this regard as well.   Steroid-induced hyperglycemia, NIDT2DM: HbA1c 7.5%. - Added linagliptin with improvement. At inpatient goal. - Continue SSI  Wheezing: No obstruction on PFTs in 2019, former smoker. Improved. - MDI to be used, at bedside.  Obesity: Estimated body mass index is 30.01 kg/m as calculated from the following:   Height as of this encounter: 5\' 7"  (1.702 m).   Weight as of this encounter: 86.9 kg.  DVT prophylaxis: Lovenox Code Status: Full Family Communication: None at bedside. Will call wife this PM. Disposition Plan:  Status is: Inpatient  Remains inpatient appropriate because:Inpatient level of care appropriate due to severity of illness   Dispo: The patient is from: Home              Anticipated d/c is to: TBD, guarded prognosis              Anticipated d/c date is: > 3 days              Patient currently is not medically stable to  d/c.  Consultants:   PCCM  Procedures:   None  Antimicrobials:  Remdesivir 7/17 - 7/21   Subjective: Continues to have agitation refractory to interventions including frequent redirection, reorientation, restraint minimization, avoidance of urinary retention.   Objective: Vitals:   06/03/20 1500 06/03/20 1502 06/03/20 1600 06/03/20  1700  BP: (!) 159/75 (!) 136/69 (!) 160/74 (!) 161/81  Pulse: 58 55 50 49  Resp: 20 20 23 22   Temp:   (!) 97 F (36.1 C)   TempSrc:   Axillary   SpO2: 98%  90% 92%  Weight:      Height:        Intake/Output Summary (Last 24 hours) at 06/03/2020 1809 Last data filed at 06/03/2020 1732 Gross per 24 hour  Intake 1309.31 ml  Output 700 ml  Net 609.31 ml   Filed Weights   05/23/2020 1600  Weight: 86.9 kg   Gen: 79 y.o. male in no distress at time of my evaluation.  Pulm: Nonlabored on 15L HFNC, crackles bilaterally throughout. CV: Regular bradycardia. No murmur, rub, or gallop. No JVD, no dependent edema. GI: Abdomen soft, non-tender, non-distended, with normoactive bowel sounds.  Ext: Warm, no deformities Skin: No rashes, lesions or ulcers on visualized skin. Neuro: Drowsy and not conversant, moves all extremities, follows commands. Psych: UTD.  Data Reviewed: I have personally reviewed following labs and imaging studies  CBC: Recent Labs  Lab 06/04/2020 1259 06/02/2020 1259 05/13/2020 1622 05/30/20 0339 05/31/20 0854 06/01/20 0245 06/03/20 0231  WBC 7.8   < > 7.1 4.7 7.0 7.9 8.3  NEUTROABS 7.0  --   --   --  6.1 6.9  --   HGB 14.0   < > 13.8 13.2 13.6 13.7 14.7  HCT 42.8   < > 41.8 40.6 40.8 41.5 45.1  MCV 97.3   < > 97.0 95.3 95.1 94.7 96.2  PLT 163   < > 153 172 212 229 221   < > = values in this interval not displayed.   Basic Metabolic Panel: Recent Labs  Lab 05/30/20 0339 05/30/20 1905 05/31/20 0854 06/01/20 0245 06/03/20 0231  NA 141 139 138 143 144  K 4.5 4.3 4.5 4.4 4.5  CL 101 102 102 105 108  CO2 17* 23 22 24 24   GLUCOSE 300* 446* 371* 253* 145*  BUN 47* 59* 54* 49* 36*  CREATININE 1.42* 1.49* 1.24 1.19 1.16  CALCIUM 8.4* 8.5* 8.6* 8.8* 8.7*   GFR: Estimated Creatinine Clearance: 55.2 mL/min (by C-G formula based on SCr of 1.16 mg/dL). Liver Function Tests: Recent Labs  Lab 05/27/2020 1259 05/30/20 0339 05/31/20 0854 06/01/20 0245 06/03/20 0231   AST 43* 51* 36 40 36  ALT 28 38 32 30 23  ALKPHOS 46 49 51 54 63  BILITOT 0.9 0.8 0.9 0.8 0.9  PROT 7.3 7.4 6.9 6.7 6.5  ALBUMIN 3.2* 3.1* 2.9* 2.9* 2.8*   No results for input(s): LIPASE, AMYLASE in the last 168 hours. No results for input(s): AMMONIA in the last 168 hours. Coagulation Profile: No results for input(s): INR, PROTIME in the last 168 hours. Cardiac Enzymes: No results for input(s): CKTOTAL, CKMB, CKMBINDEX, TROPONINI in the last 168 hours. BNP (last 3 results) No results for input(s): PROBNP in the last 8760 hours. HbA1C: No results for input(s): HGBA1C in the last 72 hours. CBG: Recent Labs  Lab 06/02/20 0828 06/02/20 1156 06/03/20 0826 06/03/20 1300 06/03/20 1617  GLUCAP 231* 185* 154* 88 105*   Lipid Profile: No  results for input(s): CHOL, HDL, LDLCALC, TRIG, CHOLHDL, LDLDIRECT in the last 72 hours. Thyroid Function Tests: No results for input(s): TSH, T4TOTAL, FREET4, T3FREE, THYROIDAB in the last 72 hours. Anemia Panel: Recent Labs    06/02/20 0217 06/03/20 0231  FERRITIN 342* 309   Urine analysis:    Component Value Date/Time   COLORURINE YELLOW 05/28/2012 [place] 05/28/2012 1055   LABSPEC 1.018 05/28/2012 1055   PHURINE 5.5 05/28/2012 1055   GLUCOSEU NEGATIVE 05/28/2012 1055   HGBUR NEGATIVE 05/28/2012 1055   BILIRUBINUR NEGATIVE 05/28/2012 1055   KETONESUR 15 (A) 05/28/2012 1055   PROTEINUR NEGATIVE 05/28/2012 1055   UROBILINOGEN 0.2 05/28/2012 1055   NITRITE NEGATIVE 05/28/2012 1055   LEUKOCYTESUR NEGATIVE 05/28/2012 1055   Recent Results (from the past 240 hour(s))  SARS Coronavirus 2 by RT PCR (hospital order, performed in [place] hospital lab) Nasopharyngeal Nasopharyngeal Swab     Status: Abnormal   Collection Time: 06/01/2020 12:59 PM   Specimen: Nasopharyngeal Swab  Result Value Ref Range Status   SARS Coronavirus 2 POSITIVE (A) NEGATIVE Final    Comment: RESULT CALLED TO, READ BACK BY AND VERIFIED  WITH: Samul Dada RN [id number] 05/31/2020 JM (NOTE) SARS-CoV-2 target nucleic acids are DETECTED  SARS-CoV-2 RNA is generally detectable in upper respiratory specimens  during the acute phase of infection.  Positive results are indicative  of the presence of the identified virus, but do not rule out bacterial infection or co-infection with other pathogens not detected by the test.  Clinical correlation with patient history and  other diagnostic information is necessary to determine patient infection status.  The expected result is negative.  Fact Sheet for Patients:   StrictlyIdeas.no   Fact Sheet for Healthcare Providers:   BankingDealers.co.za    This test is not yet approved or cleared by the [place] FDA and  has been authorized for detection and/or diagnosis of SARS-CoV-2 by FDA under an Emergency Use Authorization (EUA).  This EUA will remain in effect (meaning this test can  be used) for the duration of  the COVID-19 declaration under Section 564(b)(1) of the Act, 21 U.S.C. section 360-bbb-3(b)(1), unless the authorization is terminated or revoked sooner.  Performed at [facility], [place] [address], [place], [place] [address]   Blood Culture (routine x 2)     Status: None   Collection Time: 05/27/2020 12:59 PM   Specimen: BLOOD  Result Value Ref Range Status   Specimen Description   Final    BLOOD BLOOD LEFT FOREARM Performed at [facility] [address], [place], [place] [address]    Special Requests   Final    BOTTLES DRAWN AEROBIC AND ANAEROBIC Blood Culture adequate volume Performed at [facility] [address] [place], [place] [address]    Culture   Final    NO GROWTH 5 DAYS Performed at [facility], [place] [address] [place], [place] [address]    Report Status 06/03/2020 FINAL  Final  Blood Culture (routine x 2)     Status: None   Collection Time:  06/06/2020 12:59 PM   Specimen: BLOOD  Result Value Ref Range Status   Specimen Description   Final    BLOOD LEFT HAND Performed at [facility] [address], [place], [place] [address]    Special Requests   Final    BOTTLES DRAWN AEROBIC AND ANAEROBIC Blood Culture results may not be optimal due  to an inadequate volume of blood received in culture bottles Performed at [facility] [address] [place], [place] [address]    Culture   Final    NO GROWTH 5 DAYS Performed at [facility], [place] [address] [place], [place] [address]    Report Status 06/03/2020 FINAL  Final  MRSA PCR Screening     Status: None   Collection Time: 05/17/2020  4:51 PM   Specimen: Nasal Mucosa; Nasopharyngeal  Result Value Ref Range Status   MRSA by PCR NEGATIVE NEGATIVE Final    Comment:        The GeneXpert MRSA Assay (FDA approved for NASAL specimens only), is one component of a comprehensive MRSA colonization surveillance program. It is not intended to diagnose MRSA infection nor to guide or monitor treatment for MRSA infections. Performed at [facility], [place] [address] [place], [place] [address]       Radiology Studies: DG CHEST PORT 1 VIEW  Result Date: 06/03/2020 CLINICAL DATA:  COVID. EXAM: PORTABLE CHEST 1 VIEW COMPARISON:  05/28/2020. FINDINGS: Heart size stable. Low lung volumes. Diffuse severe bilateral pulmonary infiltrates, progressed from prior exam. No pleural effusion or pneumothorax. Degenerative changes scoliosis thoracic spine. No acute bony abnormality IMPRESSION: Low lung volumes. Diffuse severe bilateral pulmonary infiltrates, progressed from prior exam. Electronically Signed   By: [place]   On: 06/03/2020 07:03    Scheduled Meds: . albuterol  2 puff Inhalation Q6H  . vitamin C  500 mg Oral Daily  . atorvastatin  20 mg Oral Daily  . Chlorhexidine Gluconate Cloth  6 each Topical Daily  . enoxaparin  (LOVENOX) injection  40 mg Subcutaneous Q12H  . feeding supplement (ENSURE ENLIVE)  237 mL Oral BID BM  . insulin aspart  0-20 Units Subcutaneous TID WC  . insulin aspart  0-5 Units Subcutaneous QHS  . insulin aspart  6 Units Subcutaneous TID WC  . insulin glargine  20 Units Subcutaneous Daily  . linagliptin  5 mg Oral Daily  . loperamide  2 mg Oral BID  . mouth rinse  15 mL Mouth Rinse BID  . methylPREDNISolone (SOLU-MEDROL) injection  80 mg Intravenous Q12H  . pantoprazole  40 mg Oral Daily  . zinc sulfate  220 mg Oral Daily   Continuous Infusions: . dexmedetomidine (PRECEDEX) IV infusion 0.6 mcg/kg/hr (06/03/20 1732)  . lactated ringers 50 mL/hr at 06/03/20 1600     LOS: 5 days   Time spent: 35 minutes.  Patrecia Pour, MD [facility] [email or web address] 06/03/2020, 6:09 PM

## 2020-06-04 ENCOUNTER — Inpatient Hospital Stay (HOSPITAL_COMMUNITY): Payer: Medicare HMO

## 2020-06-04 DIAGNOSIS — E1159 Type 2 diabetes mellitus with other circulatory complications: Secondary | ICD-10-CM

## 2020-06-04 DIAGNOSIS — R7989 Other specified abnormal findings of blood chemistry: Secondary | ICD-10-CM

## 2020-06-04 DIAGNOSIS — J1282 Pneumonia due to coronavirus disease 2019: Secondary | ICD-10-CM

## 2020-06-04 DIAGNOSIS — J9601 Acute respiratory failure with hypoxia: Secondary | ICD-10-CM

## 2020-06-04 DIAGNOSIS — E118 Type 2 diabetes mellitus with unspecified complications: Secondary | ICD-10-CM

## 2020-06-04 DIAGNOSIS — K219 Gastro-esophageal reflux disease without esophagitis: Secondary | ICD-10-CM

## 2020-06-04 DIAGNOSIS — J069 Acute upper respiratory infection, unspecified: Secondary | ICD-10-CM

## 2020-06-04 DIAGNOSIS — I10 Essential (primary) hypertension: Secondary | ICD-10-CM

## 2020-06-04 DIAGNOSIS — U071 COVID-19: Principal | ICD-10-CM

## 2020-06-04 LAB — BLOOD GAS, ARTERIAL
Acid-Base Excess: 1.8 mmol/L (ref 0.0–2.0)
Acid-Base Excess: 0.7 mmol/L (ref 0.0–2.0)
Acid-base deficit: 0.4 mmol/L (ref 0.0–2.0)
Bicarbonate: 30.1 mmol/L — ABNORMAL HIGH (ref 20.0–28.0)
Bicarbonate: 27.9 mmol/L (ref 20.0–28.0)
Bicarbonate: 29.1 mmol/L — ABNORMAL HIGH (ref 20.0–28.0)
Drawn by: 51425
FIO2: 100
FIO2: 80
FIO2: 80
MECHVT: 400 mL
MECHVT: 400 mL
O2 Saturation: 96.9 %
O2 Saturation: 96.2 %
O2 Saturation: 94.9 %
PEEP: 10 cmH2O
PEEP: 10 cmH2O
Patient temperature: 97.2
Patient temperature: 97.3
Patient temperature: 97
RATE: 28 resp/min
RATE: 28 resp/min
pCO2 arterial: 59.4 mmHg — ABNORMAL HIGH (ref 32.0–48.0)
pCO2 arterial: 61 mmHg — ABNORMAL HIGH (ref 32.0–48.0)
pCO2 arterial: 64.1 mmHg — ABNORMAL HIGH (ref 32.0–48.0)
pH, Arterial: 7.321 — ABNORMAL LOW (ref 7.350–7.450)
pH, Arterial: 7.277 — ABNORMAL LOW (ref 7.350–7.450)
pH, Arterial: 7.279 — ABNORMAL LOW (ref 7.350–7.450)
pO2, Arterial: 117 mmHg — ABNORMAL HIGH (ref 83.0–108.0)
pO2, Arterial: 96.8 mmHg (ref 83.0–108.0)
pO2, Arterial: 93.5 mmHg (ref 83.0–108.0)

## 2020-06-04 LAB — GLUCOSE, CAPILLARY
Glucose-Capillary: 202 mg/dL — ABNORMAL HIGH (ref 70–99)
Glucose-Capillary: 63 mg/dL — ABNORMAL LOW (ref 70–99)
Glucose-Capillary: 273 mg/dL — ABNORMAL HIGH (ref 70–99)
Glucose-Capillary: 325 mg/dL — ABNORMAL HIGH (ref 70–99)
Glucose-Capillary: 179 mg/dL — ABNORMAL HIGH (ref 70–99)

## 2020-06-04 LAB — ECHOCARDIOGRAM COMPLETE
AV Mean grad: 1 mmHg
Height: 67 in
S' Lateral: 3.1 cm
Weight: 2980.62 oz

## 2020-06-04 LAB — PHOSPHORUS
Phosphorus: 6.8 mg/dL — ABNORMAL HIGH (ref 2.5–4.6)
Phosphorus: 6.3 mg/dL — ABNORMAL HIGH (ref 2.5–4.6)

## 2020-06-04 LAB — MAGNESIUM
Magnesium: 1.8 mg/dL (ref 1.7–2.4)
Magnesium: 2 mg/dL (ref 1.7–2.4)

## 2020-06-04 MED ORDER — VITAL 1.5 CAL PO LIQD
1000.0000 mL | ORAL | Status: DC
  Administered 2020-06-04 – 2020-06-08 (×6): 1000 mL
  Filled 2020-06-04 (×7): qty 1000

## 2020-06-04 MED ORDER — POLYETHYLENE GLYCOL 3350 17 G PO PACK: 17.0000 g | PACK | Freq: Every day | ORAL | Status: DC

## 2020-06-04 MED ORDER — ASCORBIC ACID 500 MG PO TABS
500.0000 mg | ORAL_TABLET | Freq: Every day | ORAL | Status: DC
  Administered 2020-06-04 – 2020-06-21 (×18): 500 mg
  Filled 2020-06-04 (×18): qty 1

## 2020-06-04 MED ORDER — ALBUMIN HUMAN 5 % IV SOLN
12.5000 g | Freq: Once | INTRAVENOUS | Status: AC
  Administered 2020-06-04: 12.5 g via INTRAVENOUS
  Filled 2020-06-04: qty 250

## 2020-06-04 MED ORDER — ATORVASTATIN CALCIUM 10 MG PO TABS
20.0000 mg | ORAL_TABLET | Freq: Every day | ORAL | Status: DC
  Administered 2020-06-04 – 2020-06-21 (×18): 20 mg
  Filled 2020-06-04 (×19): qty 2

## 2020-06-04 MED ORDER — INSULIN ASPART 100 UNIT/ML ~~LOC~~ SOLN
0.0000 [IU] | SUBCUTANEOUS | Status: DC
  Administered 2020-06-04: 5 [IU] via SUBCUTANEOUS
  Administered 2020-06-04: 3 [IU] via SUBCUTANEOUS
  Administered 2020-06-05: 5 [IU] via SUBCUTANEOUS
  Administered 2020-06-05: 11 [IU] via SUBCUTANEOUS
  Administered 2020-06-05: 5 [IU] via SUBCUTANEOUS
  Administered 2020-06-05 – 2020-06-06 (×2): 15 [IU] via SUBCUTANEOUS
  Administered 2020-06-06: 8 [IU] via SUBCUTANEOUS

## 2020-06-04 MED ORDER — ORAL CARE MOUTH RINSE
15.0000 mL | OROMUCOSAL | Status: DC
  Administered 2020-06-04 – 2020-06-22 (×173): 15 mL via OROMUCOSAL

## 2020-06-04 MED ORDER — FENTANYL CITRATE (PF) 100 MCG/2ML IJ SOLN
25.0000 ug | Freq: Once | INTRAMUSCULAR | Status: AC
  Administered 2020-06-04: 25 ug via INTRAVENOUS

## 2020-06-04 MED ORDER — ETOMIDATE 2 MG/ML IV SOLN
INTRAVENOUS | Status: AC
  Filled 2020-06-04: qty 20

## 2020-06-04 MED ORDER — FENTANYL CITRATE (PF) 100 MCG/2ML IJ SOLN: 25.0000 ug | Freq: Once | INTRAMUSCULAR | Status: AC

## 2020-06-04 MED ORDER — FENTANYL 2500MCG IN NS 250ML (10MCG/ML) PREMIX INFUSION
0.0000 ug/h | INTRAVENOUS | Status: DC
  Administered 2020-06-04: 25 ug/h via INTRAVENOUS
  Administered 2020-06-04: 400 ug/h via INTRAVENOUS
  Filled 2020-06-04 (×2): qty 250

## 2020-06-04 MED ORDER — NICARDIPINE HCL IN NACL 20-0.86 MG/200ML-% IV SOLN
0.0000 mg/h | INTRAVENOUS | Status: DC
  Administered 2020-06-04: 5 mg/h via INTRAVENOUS
  Filled 2020-06-04 (×2): qty 200

## 2020-06-04 MED ORDER — VECURONIUM BROMIDE 10 MG IV SOLR
0.0000 ug/kg/min | INTRAVENOUS | Status: DC
  Administered 2020-06-04: 1 ug/kg/min via INTRAVENOUS
  Administered 2020-06-05 – 2020-06-06 (×2): 0.75 ug/kg/min via INTRAVENOUS
  Filled 2020-06-04 (×4): qty 100

## 2020-06-04 MED ORDER — FENTANYL BOLUS VIA INFUSION
25.0000 ug | INTRAVENOUS | Status: DC | PRN
  Administered 2020-06-04 (×4): 25 ug via INTRAVENOUS
  Filled 2020-06-04: qty 25

## 2020-06-04 MED ORDER — IPRATROPIUM-ALBUTEROL 0.5-2.5 (3) MG/3ML IN SOLN
3.0000 mL | Freq: Four times a day (QID) | RESPIRATORY_TRACT | Status: DC
  Administered 2020-06-04 – 2020-06-22 (×71): 3 mL via RESPIRATORY_TRACT
  Filled 2020-06-04 (×70): qty 3

## 2020-06-04 MED ORDER — VITAL HIGH PROTEIN PO LIQD
1000.0000 mL | ORAL | Status: DC
  Administered 2020-06-04: 1000 mL

## 2020-06-04 MED ORDER — METHYLPREDNISOLONE SODIUM SUCC 40 MG IJ SOLR
40.0000 mg | INTRAMUSCULAR | Status: DC
  Administered 2020-06-04 – 2020-06-08 (×5): 40 mg via INTRAVENOUS
  Filled 2020-06-04 (×5): qty 1

## 2020-06-04 MED ORDER — MIDAZOLAM BOLUS VIA INFUSION
1.0000 mg | INTRAVENOUS | Status: DC | PRN
  Administered 2020-06-04 (×2): 2 mg via INTRAVENOUS
  Administered 2020-06-04: 1 mg via INTRAVENOUS
  Filled 2020-06-04: qty 2

## 2020-06-04 MED ORDER — ZINC SULFATE 220 (50 ZN) MG PO CAPS
220.0000 mg | ORAL_CAPSULE | Freq: Every day | ORAL | Status: DC
  Administered 2020-06-04 – 2020-06-21 (×18): 220 mg
  Filled 2020-06-04 (×18): qty 1

## 2020-06-04 MED ORDER — NOREPINEPHRINE 4 MG/250ML-% IV SOLN
0.0000 ug/min | INTRAVENOUS | Status: DC
  Administered 2020-06-04: 10 ug/min via INTRAVENOUS
  Administered 2020-06-05: 1 ug/min via INTRAVENOUS
  Administered 2020-06-05: 8 ug/min via INTRAVENOUS
  Administered 2020-06-05: 9 ug/min via INTRAVENOUS
  Administered 2020-06-06: 2 ug/min via INTRAVENOUS
  Administered 2020-06-06: 1 ug/min via INTRAVENOUS
  Filled 2020-06-04 (×4): qty 250

## 2020-06-04 MED ORDER — PROPOFOL 1000 MG/100ML IV EMUL
25.0000 ug/kg/min | INTRAVENOUS | Status: DC
  Administered 2020-06-04: 50 ug/kg/min via INTRAVENOUS
  Administered 2020-06-04 – 2020-06-06 (×9): 40 ug/kg/min via INTRAVENOUS
  Administered 2020-06-06: 60 ug/kg/min via INTRAVENOUS
  Administered 2020-06-06 (×2): 40 ug/kg/min via INTRAVENOUS
  Administered 2020-06-07 (×2): 30 ug/kg/min via INTRAVENOUS
  Administered 2020-06-08: 50 ug/kg/min via INTRAVENOUS
  Administered 2020-06-08: 60 ug/kg/min via INTRAVENOUS
  Filled 2020-06-04 (×8): qty 100
  Filled 2020-06-04: qty 200
  Filled 2020-06-04 (×10): qty 100

## 2020-06-04 MED ORDER — GUAIFENESIN-DM 100-10 MG/5ML PO SYRP
10.0000 mL | ORAL_SOLUTION | ORAL | Status: DC | PRN
  Administered 2020-06-13 – 2020-06-17 (×2): 10 mL
  Filled 2020-06-04 (×2): qty 10

## 2020-06-04 MED ORDER — LINAGLIPTIN 5 MG PO TABS
5.0000 mg | ORAL_TABLET | Freq: Every day | ORAL | Status: DC
  Administered 2020-06-04: 5 mg
  Filled 2020-06-04: qty 1

## 2020-06-04 MED ORDER — ARTIFICIAL TEARS OPHTHALMIC OINT
1.0000 "application " | TOPICAL_OINTMENT | Freq: Three times a day (TID) | OPHTHALMIC | Status: DC
  Administered 2020-06-04 – 2020-06-09 (×15): 1 via OPHTHALMIC
  Filled 2020-06-04: qty 3.5

## 2020-06-04 MED ORDER — PHENYLEPHRINE 40 MCG/ML (10ML) SYRINGE FOR IV PUSH (FOR BLOOD PRESSURE SUPPORT)
PREFILLED_SYRINGE | INTRAVENOUS | Status: AC
  Filled 2020-06-04: qty 10

## 2020-06-04 MED ORDER — HYDROCOD POLST-CPM POLST ER 10-8 MG/5ML PO SUER: 5.0000 mL | Freq: Two times a day (BID) | ORAL | Status: DC | PRN

## 2020-06-04 MED ORDER — MIDAZOLAM 50MG/50ML (1MG/ML) PREMIX INFUSION
0.0000 mg/h | INTRAVENOUS | Status: DC
  Administered 2020-06-04 (×2): 10 mg/h via INTRAVENOUS
  Administered 2020-06-04: 2 mg/h via INTRAVENOUS
  Filled 2020-06-04: qty 100
  Filled 2020-06-04: qty 50

## 2020-06-04 MED ORDER — ENOXAPARIN SODIUM 40 MG/0.4ML ~~LOC~~ SOLN
40.0000 mg | SUBCUTANEOUS | Status: DC
  Administered 2020-06-04 – 2020-06-16 (×13): 40 mg via SUBCUTANEOUS
  Filled 2020-06-04 (×13): qty 0.4

## 2020-06-04 MED ORDER — MIDAZOLAM HCL 2 MG/2ML IJ SOLN
INTRAMUSCULAR | Status: AC
  Administered 2020-06-04: 2 mg
  Filled 2020-06-04: qty 4

## 2020-06-04 MED ORDER — ACETAMINOPHEN 650 MG RE SUPP: 650.0000 mg | Freq: Four times a day (QID) | RECTAL | Status: DC | PRN

## 2020-06-04 MED ORDER — CHLORHEXIDINE GLUCONATE 0.12% ORAL RINSE (MEDLINE KIT)
15.0000 mL | Freq: Two times a day (BID) | OROMUCOSAL | Status: DC
  Administered 2020-06-04 – 2020-06-22 (×36): 15 mL via OROMUCOSAL

## 2020-06-04 MED ORDER — POLYETHYLENE GLYCOL 3350 17 G PO PACK
17.0000 g | PACK | Freq: Every day | ORAL | Status: DC
  Administered 2020-06-04 – 2020-06-09 (×6): 17 g
  Filled 2020-06-04 (×4): qty 1

## 2020-06-04 MED ORDER — ACETAMINOPHEN 160 MG/5ML PO SOLN
650.0000 mg | Freq: Four times a day (QID) | ORAL | Status: DC | PRN
  Administered 2020-06-18 – 2020-06-20 (×3): 650 mg
  Filled 2020-06-04 (×4): qty 20.3

## 2020-06-04 MED ORDER — FENTANYL 2500MCG IN NS 250ML (10MCG/ML) PREMIX INFUSION
25.0000 ug/h | INTRAVENOUS | Status: DC
  Administered 2020-06-04 – 2020-06-06 (×6): 300 ug/h via INTRAVENOUS
  Administered 2020-06-07 – 2020-06-08 (×3): 250 ug/h via INTRAVENOUS
  Filled 2020-06-04 (×10): qty 250

## 2020-06-04 MED ORDER — FREE WATER
200.0000 mL | Status: DC
  Administered 2020-06-04 – 2020-06-13 (×47): 200 mL

## 2020-06-04 MED ORDER — NOREPINEPHRINE 4 MG/250ML-% IV SOLN
INTRAVENOUS | Status: AC
  Administered 2020-06-04: 2 ug/min via INTRAVENOUS
  Filled 2020-06-04: qty 250

## 2020-06-04 MED ORDER — DEXTROSE 50 % IV SOLN
INTRAVENOUS | Status: AC
  Administered 2020-06-04: 25 mL
  Filled 2020-06-04: qty 50

## 2020-06-04 MED ORDER — FENTANYL BOLUS VIA INFUSION
25.0000 ug | INTRAVENOUS | Status: DC | PRN
  Administered 2020-06-06 – 2020-06-09 (×10): 25 ug via INTRAVENOUS
  Filled 2020-06-04: qty 25

## 2020-06-04 MED ORDER — ENSURE ENLIVE PO LIQD: 237.0000 mL | Freq: Two times a day (BID) | ORAL | Status: DC

## 2020-06-04 MED ORDER — PROSOURCE TF PO LIQD
45.0000 mL | Freq: Two times a day (BID) | ORAL | Status: DC
  Administered 2020-06-04: 45 mL
  Filled 2020-06-04: qty 45

## 2020-06-04 MED ORDER — DOCUSATE SODIUM 50 MG/5ML PO LIQD
100.0000 mg | Freq: Two times a day (BID) | ORAL | Status: DC
  Administered 2020-06-04 – 2020-06-09 (×11): 100 mg
  Filled 2020-06-04 (×11): qty 10

## 2020-06-04 MED ORDER — VECURONIUM BOLUS VIA INFUSION
10.0000 mg | Freq: Once | INTRAVENOUS | Status: AC
  Administered 2020-06-04: 10 mg via INTRAVENOUS
  Filled 2020-06-04: qty 10

## 2020-06-04 MED ORDER — ROCURONIUM BROMIDE 10 MG/ML (PF) SYRINGE
PREFILLED_SYRINGE | INTRAVENOUS | Status: AC
  Administered 2020-06-04: 100 mg
  Filled 2020-06-04: qty 10

## 2020-06-04 MED ORDER — PANTOPRAZOLE SODIUM 40 MG PO PACK
40.0000 mg | PACK | Freq: Every day | ORAL | Status: DC
  Administered 2020-06-04 – 2020-06-21 (×18): 40 mg
  Filled 2020-06-04 (×18): qty 20

## 2020-06-04 MED ORDER — FENTANYL CITRATE (PF) 100 MCG/2ML IJ SOLN
INTRAMUSCULAR | Status: AC
  Administered 2020-06-04: 50 ug via INTRAVENOUS
  Filled 2020-06-04: qty 2

## 2020-06-04 MED ORDER — PROSOURCE TF PO LIQD
90.0000 mL | Freq: Two times a day (BID) | ORAL | Status: DC
  Administered 2020-06-04 – 2020-06-09 (×10): 90 mL
  Filled 2020-06-04 (×10): qty 90

## 2020-06-04 MED ORDER — DOCUSATE SODIUM 50 MG/5ML PO LIQD: 100.0000 mg | Freq: Two times a day (BID) | ORAL | Status: DC

## 2020-06-04 NOTE — Progress Notes (Signed)
1245: Pt now at 10 mg/hr Versed, 400 mcg/hr, and has received PRN boluses for sedation. BIS scores remain > 70. Louretta Parma NP notified. Per NP, stop Versed gtt and start Propofol at 50 mcg/kg/min. Titrate medications after to achieve BIS < 60.   1330: Versed now stopped and Propofol infusing at 50 mcg/kg/min. BIS now in the 30-40s range. Vecuronium started. Pt tolerating well with vital signs stable. Pt now synchronous with ventilator. No s/s of distress at this time.

## 2020-06-04 NOTE — Progress Notes (Signed)
*  PRELIMINARY RESULTS* Echocardiogram 2D Echocardiogram has been performed.  Jim Clarke 06/04/2020, 2:42 PM

## 2020-06-04 NOTE — Progress Notes (Addendum)
eLink Physician-Brief Progress Note Patient Name: Jim Clarke DOB: [birth year] MRN: [id number]   Date of Service  06/04/2020  HPI/Events of Note  Patient initially with marked hypertension requiring a Cardene infusion, he's now hypotensive despite discontinuing the Cardene, Norepinephrine will be started and titrated to a MAP of 65 mmHg. CVP 3  eICU Interventions  Norepinephrine infusion ordered. Albumin 5 %  250 ml iv x 1.        Kerry Kass Sussan Meter 06/04/2020, 4:24 AM

## 2020-06-04 NOTE — Procedures (Signed)
Central Venous Catheter Insertion Procedure Note Jim Clarke [id number] 1941-09-04  Procedure: Insertion of Central Venous Catheter Indications: Assessment of intravascular volume  Procedure Details Consent: Risks of procedure as well as the alternatives and risks of each were explained to the (patient/caregiver).  Consent for procedure obtained. Time Out: Verified patient identification, verified procedure, site/side was marked, verified correct patient position, special equipment/implants available, medications/allergies/relevent history reviewed, required imaging and test results available.  Performed  Maximum sterile technique was used including antiseptics, cap, gloves, gown, hand hygiene, mask and sheet. Skin prep: Chlorhexidine; local anesthetic administered A antimicrobial bonded/coated triple lumen catheter was placed in the left subclavian vein using the Seldinger technique.  Ultrasound was used to verify the patency of the vein and for real time needle guidance.  Evaluation Blood flow good Complications: No apparent complications Patient did tolerate procedure well. Chest X-ray ordered to verify placement.  CXR: pending.  Jim Clarke 06/04/2020, 2:07 AM

## 2020-06-04 NOTE — Progress Notes (Signed)
Nutrition Follow-up  INTERVENTION:   -Switch to Vital 1.5 @ 55 ml/hr  -90 ml Prosource TF BID -Provides  2140 kcals, 133g protein and 1008 ml H2O -Free water flushes per MD (currently 200 ml every 4 hours)  NUTRITION DIAGNOSIS:   Increased nutrient needs related to acute illness, catabolic illness (FYBOF-75 infection) as evidenced by estimated needs.  GOAL:   Patient will meet greater than or equal to 90% of their needs  MONITOR:   TF tolerance, Vent status, I & O's, Labs, Weight trends  REASON FOR ASSESSMENT:   Consult, Ventilator Enteral/tube feeding initiation and management  ASSESSMENT:   79 y.o. male with medical history of type 2 DM on oral hyperglycemic meds, former tobacco abuse, and obesity. He presented to the ED from monoclonal infusion clinic with hypoxia and SOB. He was vaccinated in 03/2020 and has been exposed to unvaccinated wife who is currently recovering from [place]. He called PCP on 7/15 for generalized malaise, fevers, coughing, SOB and was subsequently referred for outpatient monoclonal infusion scheduled for 7/17. While at infusion center, patient noted to have O2 sats into the 70% requiring up 6L on initial presentation.  7/17: admitted for PNA from COVID-19 infection 7/23: intubated early morning d/t ARDS  Patient is currently intubated on ventilator support MV: 15.8 L/min Temp (24hrs), Avg:97.4 F (36.3 C), Min:96.4 F (35.8 C), Max:98.1 F (36.7 C)  Precedex now stopped. MD has consulted for TF initiation. Protocol already started, pt receiving Vital HP @ 40 ml/hr with Prosource TF BID (provides 1040 kcals and 105g protein). Will adjust to better meet nutritional needs.  I/Os: +1.1L since admit UOP: 1325 ml x 24 hrs  Admission weight: 191 lbs. Current weight: 186 lbs.  Medications: Vitamin C, Colace, Miralax, Zinc sulfate, Lactated ringers, Levophed  Labs reviewed: CBGs: 63-273 Elevated Phos Mg WNL  Diet Order:   Diet Order             Diet Carb Modified Fluid consistency: Thin; Room service appropriate? Yes  Diet effective now                 EDUCATION NEEDS:   No education needs have been identified at this time  Skin:  Skin Assessment: Reviewed RN Assessment  Last BM:  7/21  Height:   Ht Readings from Last 1 Encounters:  06/08/2020 5\' 7"  (1.702 m)    Weight:   Wt Readings from Last 1 Encounters:  06/04/20 84.5 kg    BMI:  Body mass index is 29.18 kg/m.  Estimated Nutritional Needs:   Kcal:  2100-2300 kcal  Protein:  115-130g  Fluid:  >/= 2.1 L/day   Clayton Bibles, MS, RD, LDN Inpatient Clinical Dietitian Contact information available via Amion

## 2020-06-04 NOTE — Procedures (Signed)
Intubation Procedure Note RYUN VELEZ [id number] 1941-02-25  Procedure: Intubation Indications: Respiratory insufficiency  Procedure Details Consent: Risks of procedure as well as the alternatives and risks of each were explained to the (patient/caregiver).  Consent for procedure obtained. Time Out: Verified patient identification, verified procedure, site/side was marked, verified correct patient position, special equipment/implants available, medications/allergies/relevent history reviewed, required imaging and test results available.  Performed  Drugs versed 28m Fentanyl 1076m Etomidate 2027mRocuronium 100m55m DL x 1 with MAC 3 blade Grade 1 view 8.0 ET tube passed through cords under direct visualization Placement confirmed with bilateral breath sounds, positive EtCO2 change and smoke in tube   Evaluation Hemodynamic Status: BP stable throughout; O2 sats: stable throughout Patient's Current Condition: stable Complications: No apparent complications Patient did tolerate procedure well. Chest X-ray ordered to verify placement.  CXR: pending.   BrenRoselie Awkward3/2021

## 2020-06-04 NOTE — Progress Notes (Signed)
Spent five hours at patient's bedside from insertion of indwelling foley catheter, intubation, insertion of central line, and management of sedation, blood pressure.

## 2020-06-04 NOTE — Progress Notes (Signed)
PT Cancellation Note  Patient Details Name: Jim Clarke MRN: [id number] DOB: 1941-03-01   Cancelled Treatment:      Reason Eval/Treat Not Completed: Patient not medically ready Patient intubated overnight and not appropriate for therapy evaluation. Will follow patient for the next several days and await extubation.   Rica Koyanagi  PTA Acute  Rehabilitation Services Pager      [phone] Office      [phone]

## 2020-06-04 NOTE — Consult Note (Signed)
NAME:  Jim Clarke, MRN:  [id number], DOB:  1941-01-01, LOS: 6 ADMISSION DATE:  05/22/2020, CONSULTATION DATE:  7/23 REFERRING MD:  Bonner Puna, CHIEF COMPLAINT:  Dyspnea, confusion   Brief History   79 y/o male admitted with COVID pneumonia on 7/17 after having symptoms for about 10 days.  He was treated with heated high flow, actemra, remdesivir and systemic steroids.  PCCM consulted on 7/23 in the setting of worsening agitated delirium and oxygenation.   History of present illness   79 y/o male admitted with COVID pneumonia on 7/17 after having symptoms for about 10 days.  He was treated with heated high flow, actemra, remdesivir and systemic steroids.  PCCM consulted on 7/23 in the setting of worsening agitated delirium and oxygenation.  He first started having symptoms around July 7, was vaccinated against [place] in May 2021 with the pfizer vaccine. Here in the hospital he received actemra x2 doses, remdesivir x5 days, methylprednisolone '80mg'$  IV q12h.  His agitated delirium worsened on 7/22, ativan, precedex was used.  However his work of breathing, agitation worsened as the night went on an around midnight he was pulling his mask of and was unable to be redirected despite using precedex infusion to help keep him calm.  He has also been treated with furosemide intermittently, lovenox for DVT prophylaxis, and his diabeties has been managed with sliding scale insulin.  PCCM was consulted for further evaluation when his agitated delirium was severe enough that he was interfering with his oxygen therapy and frequently desaturating.    Past Medical History  Hyeprtension GERD Hyperlipidemia DM2 Arthritis  Significant Hospital Events   7/17 admission 7/23 intubation  Consults:  PCCM  Procedures:  7/23 ETT >  7/23 Left subclavian central line >   Significant Diagnostic Tests:    Micro Data:  7/17 SARS COV 2 > positive 7/17 blood culture >   Antimicrobials/COVID Rx:  7/17 actemra >  7/18 7/17 remdesivir > 7/21 7/17 methylprednisolone >   Interim history/subjective:  As above  Objective   Blood pressure (!) 152/67, pulse 49, temperature (!) 96.5 F (35.8 C), temperature source Axillary, resp. rate 18, height '5\' 7"'$  (1.702 m), weight 84.5 kg, SpO2 94 %.    FiO2 (%):  [94 %-100 %] 100 %   Intake/Output Summary (Last 24 hours) at 06/04/2020 0126 Last data filed at 06/04/2020 0044 Gross per 24 hour  Intake 1756.05 ml  Output 975 ml  Net 781.05 ml   Filed Weights   05/26/2020 1600 06/04/20 0000  Weight: 86.9 kg 84.5 kg    Examination:  General:  Increased work of breathing, moaning, writing around in bed HENT: NCAT OP clear PULM: Crackles bases B, normal effort CV: RRR, no mgr GI: BS+, soft, nontender MSK: normal bulk and tone Neuro: somnolent, only moans to voice, doesn't answer questions, doesn't follow commands, moves all four extremities  7/23 CXR images personally reviewed > severe diffuse bilateral airspace disease  Resolved Hospital Problem list     Assessment & Plan:  ARDS due to COVID pneumonia: Continue mechanical ventilation per ARDS protocol Target TVol 6-8cc/kgIBW Target Plateau Pressure < 30cm H20 Target driving pressure less than 15 cm of water Target PaO2 55-65: titrate PEEP/FiO2 per protocol As long as PaO2 to FiO2 ratio is less than 1:150 position in prone position for 16 hours a day Check CVP daily if CVL in place Target CVP less than 4, diurese as necessary Ventilator associated pneumonia prevention protocol 7/23 plan: intubate, check ABG,  continue systemic steroids (lower dose, see below), check CVP, consider lasix, pull back ett 2cm  Acute metabolic encephalopathy due to ICU delirium, steroid use Decreased solumedrol to '40mg'$  daily PAD protocol : RASS target -2 to -3 Stop precedex Add fentanyl/versed infusions  DM2 with hyperglycemia linagiptin lantus 20 daily SSI  Constipation miralax D/c imodium   Best practice:   Diet: tube feeding Pain/Anxiety/Delirium protocol (if indicated): see above VAP protocol (if indicated): yes DVT prophylaxis: lovenox > adjust to daily dosing GI prophylaxis: Pantoprazole for stress ulcer prophylaxis Glucose control: SSI, lantus, linagliptin Mobility: bed rest Code Status: full Family Communication: I updated both his wife and daughter at length by phone this morning Disposition: remain in ICU  Labs   CBC: Recent Labs  Lab 05/28/2020 1259 06/04/2020 1259 06/12/2020 1622 05/30/20 0339 05/31/20 0854 06/01/20 0245 06/03/20 0231  WBC 7.8   < > 7.1 4.7 7.0 7.9 8.3  NEUTROABS 7.0  --   --   --  6.1 6.9  --   HGB 14.0   < > 13.8 13.2 13.6 13.7 14.7  HCT 42.8   < > 41.8 40.6 40.8 41.5 45.1  MCV 97.3   < > 97.0 95.3 95.1 94.7 96.2  PLT 163   < > 153 172 212 229 221   < > = values in this interval not displayed.    Basic Metabolic Panel: Recent Labs  Lab 05/30/20 0339 05/30/20 1905 05/31/20 0854 06/01/20 0245 06/03/20 0231  NA 141 139 138 143 144  K 4.5 4.3 4.5 4.4 4.5  CL 101 102 102 105 108  CO2 17* '23 22 24 24  '$ GLUCOSE 300* 446* 371* 253* 145*  BUN 47* 59* 54* 49* 36*  CREATININE 1.42* 1.49* 1.24 1.19 1.16  CALCIUM 8.4* 8.5* 8.6* 8.8* 8.7*   GFR: Estimated Creatinine Clearance: 54.6 mL/min (by C-G formula based on SCr of 1.16 mg/dL). Recent Labs  Lab 06/01/2020 1259 05/16/2020 1622 05/30/20 0339 05/31/20 0854 06/01/20 0245 06/03/20 0231  PROCALCITON 0.46  --   --   --   --   --   WBC 7.8   < > 4.7 7.0 7.9 8.3  LATICACIDVEN 1.4  --   --   --   --   --    < > = values in this interval not displayed.    Liver Function Tests: Recent Labs  Lab 05/26/2020 1259 05/30/20 0339 05/31/20 0854 06/01/20 0245 06/03/20 0231  AST 43* 51* 36 40 36  ALT 28 38 32 30 23  ALKPHOS 46 49 51 54 63  BILITOT 0.9 0.8 0.9 0.8 0.9  PROT 7.3 7.4 6.9 6.7 6.5  ALBUMIN 3.2* 3.1* 2.9* 2.9* 2.8*   No results for input(s): LIPASE, AMYLASE in the last 168 hours. No results  for input(s): AMMONIA in the last 168 hours.  ABG    Component Value Date/Time   PHART 7.415 06/02/2020 0315   PCO2ART 38.0 06/02/2020 0315   PO2ART 61.3 (L) 06/02/2020 0315   HCO3 23.9 06/02/2020 0315   O2SAT 89.7 06/02/2020 0315     Coagulation Profile: No results for input(s): INR, PROTIME in the last 168 hours.  Cardiac Enzymes: No results for input(s): CKTOTAL, CKMB, CKMBINDEX, TROPONINI in the last 168 hours.  HbA1C: Hemoglobin A1C  Date/Time Value Ref Range Status  03/31/2020 10:39 AM 7.2 (A) 4.0 - 5.6 % Final  03/04/2018 10:54 AM 7.8  Final   Hgb A1c MFr Bld  Date/Time Value Ref Range Status  05/31/2020  10:49 AM 7.5 (H) 4.8 - 5.6 % Final    Comment:    (NOTE) Pre diabetes:          5.7%-6.4%  Diabetes:              >6.4%  Glycemic control for   <7.0% adults with diabetes     CBG: Recent Labs  Lab 06/02/20 0828 06/02/20 1156 06/03/20 0826 06/03/20 1300 06/03/20 1617  GLUCAP 231* 185* 154* 88 105*    Review of Systems:   Cannot obtain due to intubation  Past Medical History  He,  has a past medical history of Arthritis, Calcium oxalate renal stones, Diabetes mellitus, Dyslipidemia, ED (erectile dysfunction), Former smoker, GERD (gastroesophageal reflux disease), Hypertension, and Superficial basal cell carcinoma (08/31/15).   Surgical History    Past Surgical History:  Procedure Laterality Date  . APPENDECTOMY    . CATARACT EXTRACTION W/ INTRAOCULAR LENS  IMPLANT, BILATERAL Bilateral 2016  . COLONOSCOPY  2004,2009,2011  . KNEE ARTHROSCOPY     RIGHT  Eulas Post MD)  . SKIN BIOPSY Right 09/19/2018   epidermoid cyst ( see report in chart )  . UPPER GASTROINTESTINAL ENDOSCOPY     schatzki ring,  LA Grade A reflux esophagitis     Social History   reports that he quit smoking about 22 years ago. He has a 105.00 pack-year smoking history. He has never used smokeless tobacco. He reports current alcohol use of about 1.0 standard drink of alcohol per  week. He reports that he does not use drugs.   Family History   His family history includes Arthritis in his father and mother; Cancer in his father; Diabetes in his father; Mental illness in his mother; Seizures in his mother.   Allergies Allergies  Allergen Reactions  . Sulfa Antibiotics      Home Medications  Prior to Admission medications   Medication Sig Start Date End Date Taking? Authorizing Provider  albuterol (VENTOLIN HFA) 108 (90 Base) MCG/ACT inhaler Inhale 2 puffs into the lungs every 6 (six) hours as needed for wheezing or shortness of breath. 05/28/20  Yes Tysinger, Camelia Eng, PA-C  aspirin 81 MG tablet Take 81 mg by mouth at bedtime.    Yes [provider]  atorvastatin (LIPITOR) 20 MG tablet Take 1 tablet (20 mg total) by mouth daily. Patient taking differently: Take 20 mg by mouth at bedtime.  02/06/17  Yes Denita Lung, MD  Blood Glucose Monitoring Suppl (ONE TOUCH ULTRA 2) w/Device KIT 1 each by Does not apply route daily. Pt testing twice daily 09/30/19  Yes Denita Lung, MD  Blood Pressure Monitoring [facility]) MISC 1 Device by Does not apply route every morning. 05/28/12  Yes Upstill, Nehemiah Settle, PA-C  budesonide-formoterol (SYMBICORT) 160-4.5 MCG/ACT inhaler Inhale 2 puffs into the lungs 2 (two) times daily for 1 day. 09/09/18 05/19/2020 Yes Mannam, Praveen, MD  empagliflozin (JARDIANCE) 10 MG TABS tablet Take 10 mg by mouth daily. 01/29/20  Yes Denita Lung, MD  glipiZIDE (GLUCOTROL) 10 MG tablet Take 10 mg by mouth 2 (two) times daily. 04/02/20  Yes [provider]  glucose blood (ONE TOUCH ULTRA TEST) test strip 1 each by Other route 2 (two) times daily. Use as instructed Patient is to test bid DX:E11.9 11/25/15  Yes Denita Lung, MD  glucose blood test strip Use as instructed pt is to test two times daily 250.00 08/22/12  Yes Denita Lung, MD  Lancets [facility] ULTRASOFT) lancets Use as instructed  11/25/15  Yes Denita Lung, MD   lisinopril (ZESTRIL) 5 MG tablet Take 1 tablet (5 mg total) by mouth daily. Patient taking differently: Take 5 mg by mouth at bedtime.  01/29/20  Yes Denita Lung, MD  metFORMIN (GLUCOPHAGE) 1000 MG tablet Take 1 tablet (1,000 mg total) by mouth 2 (two) times daily with a meal. 01/02/20  Yes Denita Lung, MD  Multiple Vitamins-Minerals (EMERGEN-C IMMUNE PLUS) PACK Take 1 tablet by mouth 2 (two) times daily. 05/28/20  Yes Tysinger, Camelia Eng, PA-C  omeprazole (PRILOSEC) 20 MG capsule Take 20 mg by mouth every other day.    Yes [provider]  mometasone (NASONEX) 50 MCG/ACT nasal spray Place 2 sprays into the nose daily. Patient not taking: Reported on 03/31/2020 10/29/18   Marshell Garfinkel, MD     Critical care time: 45 minutes    [name], MD [place] Pager: [phone] Cell: [phone] If no response, call [phone]

## 2020-06-04 NOTE — Progress Notes (Signed)
OT Cancellation Note  Patient Details Name: Jim Clarke MRN: [id number] DOB: June 19, 1941   Cancelled Treatment:    Reason Eval/Treat Not Completed: Patient not medically ready Patient intubated overnight and not appropriate for therapy evaluation. Will follow patient for the next several days and await extubation.  Danzel Marszalek L Delmos Velaquez 06/04/2020, 7:32 AM

## 2020-06-04 NOTE — Progress Notes (Signed)
Hypoglycemic Event  CBG: 63  Treatment: D50 25 mL (12.5 gm)  Symptoms: None  Follow-up CBG: [id number] CBG Result:273  Possible Reasons for Event: Inadequate meal intake  Comments/MD notified: Louretta Parma NP

## 2020-06-04 NOTE — Progress Notes (Signed)
Endotracheal tube pulled back 2cm per MD order.  Placement now 24cm at the lip.

## 2020-06-04 NOTE — Progress Notes (Addendum)
NAME:  Jim Clarke, MRN:  [id number], DOB:  1941-03-13, LOS: 6 ADMISSION DATE:  05/28/2020, CONSULTATION DATE:  7/23 REFERRING MD:  Bonner Puna, CHIEF COMPLAINT:  Dyspnea, confusion   Brief History   79 y/o male admitted with COVID pneumonia on 7/17 after having symptoms for about 10 days.  He was treated with heated high flow, actemra, remdesivir and systemic steroids.  PCCM consulted on 7/23 in the setting of worsening agitated delirium and oxygenation.   Past Medical History  Hyeprtension GERD Hyperlipidemia DM2 Arthritis  Significant Hospital Events   7/17 admission 7/23 intubation  Consults:  PCCM  Procedures:  7/23 ETT >  7/23 Left subclavian central line >   Significant Diagnostic Tests:    Micro Data:  7/17 SARS COV 2 > positive 7/17 blood culture >   Antimicrobials/COVID Rx:  7/17 actemra > 7/18 7/17 remdesivir > 7/21 7/17 methylprednisolone >   Interim history/subjective:   Intubated overnight.  Remains on ventilator requiring high oxygen Levophed started for hypotension.  Objective   Blood pressure (!) 157/89, pulse 88, temperature (!) 96.8 F (36 C), resp. rate (!) 41, height 5\' 7"  (1.702 m), weight 84.5 kg, SpO2 96 %. CVP:  [1 mmHg-28 mmHg] 2 mmHg  Vent Mode: PRVC FiO2 (%):  [80 %-100 %] 80 % Set Rate:  [28 bmp] 28 bmp Vt Set:  [400 mL] 400 mL PEEP:  [10 cmH20] 10 cmH20 Plateau Pressure:  [21 cmH20-29 cmH20] 21 cmH20   Intake/Output Summary (Last 24 hours) at 06/04/2020 1017 Last data filed at 06/04/2020 0700 Gross per 24 hour  Intake 1846.93 ml  Output 1075 ml  Net 771.93 ml   Filed Weights   06/03/2020 1600 06/04/20 0000 06/04/20 0348  Weight: 86.9 kg 84.5 kg 84.5 kg    Examination: Gen:      No acute distress HEENT:  EOMI, sclera anicteric Neck:     No masses; no thyromegaly, ETT Lungs:    Clear to auscultation bilaterally; normal respiratory effort CV:         Regular rate and rhythm; no murmurs Abd:      + bowel sounds; soft,  non-tender; no palpable masses, no distension Ext:    No edema; adequate peripheral perfusion Skin:      Warm and dry; no rash Neuro: Sedated  Labs reviewed significant for BUN/creatinine 26/1.16, WBC 8.3 Chest x-ray 06/04/2020-bilateral airspace disease.  Resolved Hospital Problem list     Assessment & Plan:  ARDS due to COVID pneumonia: Continue low TV ventilation Start paralytics as he is dyssynchronous with vent Recheck ABG and prone if P/F ratio < 150 Goal driving pressure < 15 Diuresis on hold due to hypotension Continue solumedrol  Acute metabolic encephalopathy due to ICU delirium, steroid use PAD protocol : RASS target -2 to -3 Add fentanyl/versed infusions  DM2 with hyperglycemia linagiptin lantus 20 daily SSI  Constipation miralax  Best practice:  Diet: tube feeding Pain/Anxiety/Delirium protocol (if indicated): see above VAP protocol (if indicated): yes DVT prophylaxis: lovenox > adjust to daily dosing GI prophylaxis: Pantoprazole for stress ulcer prophylaxis Glucose control: SSI, lantus, linagliptin Mobility: bed rest Code Status: full Family Communication: Daughter and wife updated 7/23. Disposition: remain in ICU  Critical care time:    The patient is critically ill with multiple organ system failure and requires high complexity decision making for assessment and support, frequent evaluation and titration of therapies, advanced monitoring, review of radiographic studies and interpretation of complex data.   Critical Care Time  devoted to patient care services, exclusive of separately billable procedures, described in this note is 35 minutes.   Marshell Garfinkel MD [place] [facility] Please see [email or web address] for pager details.  06/04/2020, 8:23 AM

## 2020-06-04 NOTE — Progress Notes (Signed)
[facility] Progress Note Patient Name: Jim Clarke DOB: 1941/07/19 MRN: [id number]   Date of Service  06/04/2020  HPI/Events of Note  Patient is s/p intubation. BP 200/131, MAP 152. He is adequately sedated.  eICU Interventions  Cardene infusion ordered.        Kerry Kass Rosanne Wohlfarth 06/04/2020, 2:48 AM

## 2020-06-04 NOTE — Progress Notes (Signed)
Inpatient Diabetes Program Recommendations  AACE/ADA: New Consensus Statement on Inpatient Glycemic Control (2015)  Target Ranges:  Prepandial:   less than 140 mg/dL      Peak postprandial:   less than 180 mg/dL (1-2 hours)      Critically ill patients:  140 - 180 mg/dL   Lab Results  Component Value Date   GLUCAP 325 (H) 06/04/2020   HGBA1C 7.5 (H) 05/31/2020    Review of Glycemic Control  Diabetes history:  Outpatient Diabetes medications: Jardiance 10 mg QD, glipizide 10 mg bid, metformin 1000 mg bid Current orders for Inpatient glycemic control: Novolog 0-20 units tidwc and hs + 6 units tidwc, Lantus 20 units QD,  HgbA1C - 7.5% On Solumedrol 40 mg Q24H Starting TFs - Vital at 55/H   Inpatient Diabetes Program Recommendations:     Change Novolog to 0-20 units Q4H Add TF coverage - Novolog 4 units Q4H Continue with Lantus 20 units QD D/C CHO mod diet - pt is NPO  Follow closely and titrate Novolog QD according to blood sugars.   Thank you. Lorenda Peck, RD, LDN, CDE Inpatient Diabetes Coordinator [phone]

## 2020-06-04 NOTE — Progress Notes (Signed)
Bilateral lower extremity venous duplex has been completed. Preliminary results can be found in CV Proc through chart review.   06/04/20 10:26 AM Carlos Levering RVT

## 2020-06-04 NOTE — Progress Notes (Signed)
eLink Physician-Brief Progress Note Patient Name: Jim Clarke DOB: 06-Apr-1941 MRN: [id number]   Date of Service  06/04/2020  HPI/Events of Note  Patient with  Acute hypoxemic respiratory failure due to COVID pneumonia, he has delirium and intermittent severe desaturation on HHFNC + non re-breather mask.  eICU Interventions  I've asked PCCM bedside crew to evaluate him for intubation. Foley catheter ordered.        Kerry Kass Jim Clarke 06/04/2020, 12:28 AM

## 2020-06-04 NOTE — Progress Notes (Signed)
Midnight rounds to reassess patient found his bed saturated with urine. Initially, patient cooperative and following commands to turn and RN able to maintain safety. However, patient suddenly became agitated, combative, pulled off all oxygen devices, and RN called for manpower assistance. Patient desaturated significantly at least to 70 %. E link contacted. Dr. Lucile Shutters gave verbal order for temp sensing indwelling foley and advised that he was sending ground crew CCM provider to intubate patient. Charge Forensic psychologist informed. MD will notify family.

## 2020-06-05 ENCOUNTER — Inpatient Hospital Stay (HOSPITAL_COMMUNITY): Payer: Medicare HMO

## 2020-06-05 LAB — PHOSPHORUS: Phosphorus: 5.2 mg/dL — ABNORMAL HIGH (ref 2.5–4.6)

## 2020-06-05 LAB — BLOOD GAS, ARTERIAL
Acid-Base Excess: 0.7 mmol/L (ref 0.0–2.0)
Acid-base deficit: 1.1 mmol/L (ref 0.0–2.0)
Acid-base deficit: 0.1 mmol/L (ref 0.0–2.0)
Bicarbonate: 27.4 mmol/L (ref 20.0–28.0)
Bicarbonate: 29.1 mmol/L — ABNORMAL HIGH (ref 20.0–28.0)
Bicarbonate: 27.3 mmol/L (ref 20.0–28.0)
FIO2: 0.7
FIO2: 60
FIO2: 60
MECHVT: 400 mL
O2 Saturation: 90.2 %
O2 Saturation: 90.1 %
O2 Saturation: 93.4 %
PEEP: 10 cmH2O
Patient temperature: 98
Patient temperature: 98.6
Patient temperature: 98.6
RATE: 30 resp/min
pCO2 arterial: 62.2 mmHg — ABNORMAL HIGH (ref 32.0–48.0)
pCO2 arterial: 65.6 mmHg (ref 32.0–48.0)
pCO2 arterial: 58.9 mmHg — ABNORMAL HIGH (ref 32.0–48.0)
pH, Arterial: 7.267 — ABNORMAL LOW (ref 7.350–7.450)
pH, Arterial: 7.269 — ABNORMAL LOW (ref 7.350–7.450)
pH, Arterial: 7.289 — ABNORMAL LOW (ref 7.350–7.450)
pO2, Arterial: 66.6 mmHg — ABNORMAL LOW (ref 83.0–108.0)
pO2, Arterial: 64.2 mmHg — ABNORMAL LOW (ref 83.0–108.0)
pO2, Arterial: 71.3 mmHg — ABNORMAL LOW (ref 83.0–108.0)

## 2020-06-05 LAB — COMPREHENSIVE METABOLIC PANEL
ALT: 22 U/L (ref 0–44)
AST: 24 U/L (ref 15–41)
Albumin: 2.8 g/dL — ABNORMAL LOW (ref 3.5–5.0)
Alkaline Phosphatase: 66 U/L (ref 38–126)
Anion gap: 9 (ref 5–15)
BUN: 50 mg/dL — ABNORMAL HIGH (ref 8–23)
CO2: 29 mmol/L (ref 22–32)
Calcium: 8.1 mg/dL — ABNORMAL LOW (ref 8.9–10.3)
Chloride: 103 mmol/L (ref 98–111)
Creatinine, Ser: 1.42 mg/dL — ABNORMAL HIGH (ref 0.61–1.24)
GFR calc Af Amer: 54 mL/min — ABNORMAL LOW (ref >60)
GFR calc non Af Amer: 47 mL/min — ABNORMAL LOW (ref >60)
Glucose, Bld: 382 mg/dL — ABNORMAL HIGH (ref 70–99)
Potassium: 5.2 mmol/L — ABNORMAL HIGH (ref 3.5–5.1)
Sodium: 141 mmol/L (ref 135–145)
Total Bilirubin: 0.9 mg/dL (ref 0.3–1.2)
Total Protein: 5.9 g/dL — ABNORMAL LOW (ref 6.5–8.1)

## 2020-06-05 LAB — CBC
HCT: 46.2 % (ref 39.0–52.0)
Hemoglobin: 14.4 g/dL (ref 13.0–17.0)
MCH: 31.4 pg (ref 26.0–34.0)
MCHC: 31.2 g/dL (ref 30.0–36.0)
MCV: 100.9 fL — ABNORMAL HIGH (ref 80.0–100.0)
Platelets: 243 10*3/uL (ref 150–400)
RBC: 4.58 MIL/uL (ref 4.22–5.81)
RDW: 14.3 % (ref 11.5–15.5)
WBC: 10.4 10*3/uL (ref 4.0–10.5)
nRBC: 0.2 % (ref 0.0–0.2)

## 2020-06-05 LAB — TRIGLYCERIDES: Triglycerides: 196 mg/dL — ABNORMAL HIGH (ref <150)

## 2020-06-05 LAB — D-DIMER, QUANTITATIVE: D-Dimer, Quant: 2.99 ug/mL-FEU — ABNORMAL HIGH (ref 0.00–0.50)

## 2020-06-05 LAB — C-REACTIVE PROTEIN: CRP: 1.9 mg/dL — ABNORMAL HIGH (ref <1.0)

## 2020-06-05 LAB — MAGNESIUM: Magnesium: 2.1 mg/dL (ref 1.7–2.4)

## 2020-06-05 MED ORDER — SODIUM CHLORIDE 0.9 % IV SOLN: INTRAVENOUS | Status: DC

## 2020-06-05 MED ORDER — DEXTROSE-NACL 5-0.45 % IV SOLN: INTRAVENOUS | Status: DC

## 2020-06-05 MED ORDER — INSULIN GLARGINE 100 UNIT/ML ~~LOC~~ SOLN
25.0000 [IU] | Freq: Every day | SUBCUTANEOUS | Status: DC
  Administered 2020-06-06: 25 [IU] via SUBCUTANEOUS
  Filled 2020-06-05: qty 0.25

## 2020-06-05 MED ORDER — DEXTROSE 50 % IV SOLN: 0.0000 mL | INTRAVENOUS | Status: DC | PRN

## 2020-06-05 MED ORDER — INSULIN REGULAR(HUMAN) IN NACL 100-0.9 UT/100ML-% IV SOLN
INTRAVENOUS | Status: DC
  Administered 2020-06-05: 15 [IU]/h via INTRAVENOUS
  Administered 2020-06-06: 18 [IU]/h via INTRAVENOUS
  Filled 2020-06-05 (×3): qty 100

## 2020-06-05 NOTE — Progress Notes (Signed)
[facility] Progress Note Patient Name: OCTAVE MONTROSE DOB: 08-Apr-1941 MRN: [id number]   Date of Service  06/05/2020  HPI/Events of Note  ABG was reviewed at approximately 8:00 p.m., he did not require proning based on the results, plan is to continue to monitor him closely.  eICU Interventions  FIO2  weaned modestly.        Kerry Kass Jadie Comas 06/05/2020, 12:37 AM

## 2020-06-05 NOTE — Procedures (Signed)
Arterial Catheter Insertion Procedure Note  Jim Clarke  [id number]  1941/04/30  Date:06/05/20  Time:11:08 AM    Provider Performing: Otelia Sergeant    Procedure: Insertion of Arterial Line [id number]) with US guidance ([id number])   Indication(s) Blood pressure monitoring and/or need for frequent ABGs  Consent Unable to obtain consent due to emergent nature of procedure. Pt currently intubated.   Anesthesia None   Time Out Verified patient identification, verified procedure, site/side was marked, verified correct patient position, special equipment/implants available, medications/allergies/relevant history reviewed, required imaging and test results available.   Sterile Technique Maximal sterile technique including full sterile barrier drape, hand hygiene, sterile gown, sterile gloves, mask, hair covering, sterile ultrasound probe cover (if used).   Procedure Description Area of catheter insertion was cleaned with chlorhexidine and draped in sterile fashion. Without real-time ultrasound guidance an arterial catheter was placed into the right radial artery.  Appropriate arterial tracings confirmed on monitor.     Complications/Tolerance None; patient tolerated the procedure well.   EBL Minimal   Specimen(s) Arterial blood gas

## 2020-06-05 NOTE — Progress Notes (Signed)
Pt proned at 1400 with RT and RN in room. Pt tolerating well at this time with saturations of 93% and pulling >400 Vt. RT will get a follow up ABG at 1530. RT will continue to monitor.

## 2020-06-05 NOTE — Progress Notes (Signed)
NAME:  Jim Clarke, MRN:  [id number], DOB:  September 05, 1941, LOS: 7 ADMISSION DATE:  05/14/2020, CONSULTATION DATE:  7/23 REFERRING MD:  Bonner Puna, CHIEF COMPLAINT:  Dyspnea, confusion   Brief History   79 y/o male admitted with COVID pneumonia on 7/17 after having symptoms for about 10 days.  He was treated with heated high flow, actemra, remdesivir and systemic steroids.  PCCM consulted on 7/23 in the setting of worsening agitated delirium and oxygenation.   Past Medical History  Hyeprtension GERD Hyperlipidemia DM2 Arthritis  Significant Hospital Events   7/17 admission 7/23 intubation  Consults:  PCCM  Procedures:  7/23 ETT >  7/23 Left subclavian central line >  Radial A line 7/24 >>  Significant Diagnostic Tests:    Micro Data:  7/17 SARS COV 2 > positive 7/17 blood culture >   Antimicrobials/COVID Rx:  7/17 actemra > 7/18 7/17 remdesivir > 7/21 7/17 methylprednisolone >   Interim history/subjective:   Reamins on vent. ABGs are still poor. Reamains on norepi  Objective   Blood pressure (!) 122/42, pulse 65, temperature (!) 97.5 F (36.4 C), temperature source Bladder, resp. rate (!) 8, height 5\' 7"  (1.702 m), weight 84.8 kg, SpO2 95 %. CVP:  [0 mmHg-3 mmHg] 3 mmHg  Vent Mode: PRVC FiO2 (%):  [60 %-80 %] 60 % Set Rate:  [28 bmp-30 bmp] 30 bmp Vt Set:  [400 mL] 400 mL PEEP:  [10 cmH20] 10 cmH20 Plateau Pressure:  [25 cmH20-27 cmH20] 25 cmH20   Intake/Output Summary (Last 24 hours) at 06/05/2020 1243 Last data filed at 06/05/2020 1200 Gross per 24 hour  Intake 3626.93 ml  Output 940 ml  Net 2686.93 ml   Filed Weights   06/04/20 0000 06/04/20 0348 06/05/20 0500  Weight: 84.5 kg 84.5 kg 84.8 kg    Examination: Blood pressure (!) 122/42, pulse 65, temperature (!) 97.5 F (36.4 C), temperature source Bladder, resp. rate (!) 8, height 5\' 7"  (1.702 m), weight 84.8 kg, SpO2 95 %. Gen:      No acute distress HEENT:  EOMI, sclera anicteric Neck:     No masses;  no thyromegaly Lungs:    Clear to auscultation bilaterally; normal respiratory effort CV:         Regular rate and rhythm; no murmurs Abd:      + bowel sounds; soft, non-tender; no palpable masses, no distension Ext:    No edema; adequate peripheral perfusion Skin:      Warm and dry; no rash Neuro: Sedated  Labs reviewed significant for  Creatinine slightly high at 1.42, potassium 5.2 Chest x-ray 7/24-Stable bilateral pulmonary opacities.   Resolved Hospital Problem list     Assessment & Plan:  ARDS due to COVID pneumonia: Continue low TV ventilation Continue paralytics Start proning as P/F ratio is persistently low Goal driving pressure < 15 Diuresis on hold due to hypotension Continue solumedrol  Acute metabolic encephalopathy due to ICU delirium, steroid use PAD protocol : RASS target -2 to -3 Fentanyl/versed infusions  DM2 with hyperglycemia linagiptin Lantus. Increase dose to 25 daily SSI  Constipation miralax  Best practice:  Diet: tube feeding Pain/Anxiety/Delirium protocol (if indicated): see above VAP protocol (if indicated): yes DVT prophylaxis: lovenox > adjust to daily dosing GI prophylaxis: Pantoprazole for stress ulcer prophylaxis Glucose control: SSI, lantus, linagliptin Mobility: bed rest Code Status: full Family Communication: Daughter and wife updated 7/24. Disposition: remain in ICU  Critical care time:    The patient is critically ill with  multiple organ system failure and requires high complexity decision making for assessment and support, frequent evaluation and titration of therapies, advanced monitoring, review of radiographic studies and interpretation of complex data.   Critical Care Time devoted to patient care services, exclusive of separately billable procedures, described in this note is 35 minutes.   Marshell Garfinkel MD [place] [facility] Please see [email or web address] for pager details.  06/05/2020, 12:43 PM

## 2020-06-06 ENCOUNTER — Inpatient Hospital Stay (HOSPITAL_COMMUNITY): Payer: Medicare HMO

## 2020-06-06 LAB — BASIC METABOLIC PANEL
Anion gap: 9 (ref 5–15)
BUN: 40 mg/dL — ABNORMAL HIGH (ref 8–23)
CO2: 27 mmol/L (ref 22–32)
Calcium: 8.2 mg/dL — ABNORMAL LOW (ref 8.9–10.3)
Chloride: 102 mmol/L (ref 98–111)
Creatinine, Ser: 1.05 mg/dL (ref 0.61–1.24)
GFR calc Af Amer: >60 mL/min (ref >60)
GFR calc non Af Amer: >60 mL/min (ref >60)
Glucose, Bld: 376 mg/dL — ABNORMAL HIGH (ref 70–99)
Potassium: 6 mmol/L — ABNORMAL HIGH (ref 3.5–5.1)
Sodium: 138 mmol/L (ref 135–145)

## 2020-06-06 LAB — BLOOD GAS, ARTERIAL
Acid-Base Excess: 1.7 mmol/L (ref 0.0–2.0)
Acid-base deficit: 0.4 mmol/L (ref 0.0–2.0)
Bicarbonate: 27.4 mmol/L (ref 20.0–28.0)
Bicarbonate: 29.7 mmol/L — ABNORMAL HIGH (ref 20.0–28.0)
FIO2: 60
FIO2: 60
O2 Saturation: 91.9 %
O2 Saturation: 92.9 %
Patient temperature: 98.6
Patient temperature: 98.6
pCO2 arterial: 61.3 mmHg — ABNORMAL HIGH (ref 32.0–48.0)
pCO2 arterial: 65.1 mmHg (ref 32.0–48.0)
pH, Arterial: 7.272 — ABNORMAL LOW (ref 7.350–7.450)
pH, Arterial: 7.282 — ABNORMAL LOW (ref 7.350–7.450)
pO2, Arterial: 68 mmHg — ABNORMAL LOW (ref 83.0–108.0)
pO2, Arterial: 73.7 mmHg — ABNORMAL LOW (ref 83.0–108.0)

## 2020-06-06 LAB — CBC
HCT: 43.3 % (ref 39.0–52.0)
Hemoglobin: 13.6 g/dL (ref 13.0–17.0)
MCH: 31.7 pg (ref 26.0–34.0)
MCHC: 31.4 g/dL (ref 30.0–36.0)
MCV: 100.9 fL — ABNORMAL HIGH (ref 80.0–100.0)
Platelets: 161 10*3/uL (ref 150–400)
RBC: 4.29 MIL/uL (ref 4.22–5.81)
RDW: 14.4 % (ref 11.5–15.5)
WBC: 8 10*3/uL (ref 4.0–10.5)
nRBC: 0 % (ref 0.0–0.2)

## 2020-06-06 LAB — C-REACTIVE PROTEIN: CRP: 1.9 mg/dL — ABNORMAL HIGH (ref <1.0)

## 2020-06-06 LAB — TRIGLYCERIDES: Triglycerides: 262 mg/dL — ABNORMAL HIGH (ref <150)

## 2020-06-06 LAB — PHOSPHORUS: Phosphorus: 3.2 mg/dL (ref 2.5–4.6)

## 2020-06-06 LAB — D-DIMER, QUANTITATIVE: D-Dimer, Quant: 3.81 ug/mL-FEU — ABNORMAL HIGH (ref 0.00–0.50)

## 2020-06-06 LAB — MAGNESIUM: Magnesium: 2.4 mg/dL (ref 1.7–2.4)

## 2020-06-06 MED ORDER — INSULIN GLARGINE 100 UNIT/ML ~~LOC~~ SOLN
30.0000 [IU] | Freq: Every day | SUBCUTANEOUS | Status: DC
  Administered 2020-06-07 – 2020-06-08 (×2): 30 [IU] via SUBCUTANEOUS
  Filled 2020-06-06 (×2): qty 0.3

## 2020-06-06 MED ORDER — SODIUM ZIRCONIUM CYCLOSILICATE 10 G PO PACK
10.0000 g | PACK | Freq: Once | ORAL | Status: AC
  Administered 2020-06-06: 10 g
  Filled 2020-06-06 (×2): qty 1

## 2020-06-06 MED ORDER — FUROSEMIDE 10 MG/ML IJ SOLN
40.0000 mg | Freq: Two times a day (BID) | INTRAMUSCULAR | Status: AC
  Administered 2020-06-06 – 2020-06-07 (×2): 40 mg via INTRAVENOUS
  Filled 2020-06-06 (×2): qty 4

## 2020-06-06 MED ORDER — INSULIN ASPART 100 UNIT/ML ~~LOC~~ SOLN
0.0000 [IU] | SUBCUTANEOUS | Status: DC
  Administered 2020-06-06: 7 [IU] via SUBCUTANEOUS
  Administered 2020-06-06: 3 [IU] via SUBCUTANEOUS
  Administered 2020-06-07 (×2): 15 [IU] via SUBCUTANEOUS
  Administered 2020-06-07: 11 [IU] via SUBCUTANEOUS
  Administered 2020-06-07 (×2): 7 [IU] via SUBCUTANEOUS
  Administered 2020-06-07: 15 [IU] via SUBCUTANEOUS
  Administered 2020-06-08: 7 [IU] via SUBCUTANEOUS
  Administered 2020-06-08: 4 [IU] via SUBCUTANEOUS
  Administered 2020-06-08: 15 [IU] via SUBCUTANEOUS
  Administered 2020-06-08 – 2020-06-09 (×2): 11 [IU] via SUBCUTANEOUS
  Administered 2020-06-09: 15 [IU] via SUBCUTANEOUS

## 2020-06-06 MED ORDER — SODIUM CHLORIDE 0.9% FLUSH
10.0000 mL | Freq: Two times a day (BID) | INTRAVENOUS | Status: DC
  Administered 2020-06-06: 30 mL
  Administered 2020-06-06 – 2020-06-08 (×4): 10 mL
  Administered 2020-06-08: 30 mL
  Administered 2020-06-09 (×2): 10 mL
  Administered 2020-06-10: 30 mL
  Administered 2020-06-10 – 2020-06-12 (×4): 10 mL
  Administered 2020-06-12: 20 mL
  Administered 2020-06-13 – 2020-06-21 (×9): 10 mL

## 2020-06-06 MED ORDER — SODIUM CHLORIDE 0.9% FLUSH
10.0000 mL | INTRAVENOUS | Status: DC | PRN
  Administered 2020-06-11: 10 mL

## 2020-06-06 NOTE — Progress Notes (Addendum)
NAME:  Jim Clarke, MRN:  [id number], DOB:  06-18-1941, LOS: 8 ADMISSION DATE:  05/31/2020, CONSULTATION DATE:  7/23 REFERRING MD:  Bonner Puna, CHIEF COMPLAINT:  Dyspnea, confusion   Brief History   79 y/o male admitted with COVID pneumonia on 7/17 after having symptoms for about 10 days.  He was treated with heated high flow, actemra, remdesivir and systemic steroids.  PCCM consulted on 7/23 in the setting of worsening agitated delirium and oxygenation.   Past Medical History  Hyeprtension GERD Hyperlipidemia DM2 Arthritis  Significant Hospital Events   7/17 admission 7/23 intubation, paralysed 7/24 started proning 7/25 Off Levophed  Consults:  PCCM  Procedures:  7/23 ETT >  7/23 Left subclavian central line >  Radial A line 7/24 >>  Significant Diagnostic Tests:    Micro Data:  7/17 SARS COV 2 > positive 7/17 blood culture >   Antimicrobials/COVID Rx:  7/17 actemra > 7/18 7/17 remdesivir > 7/21 7/17 methylprednisolone >   Interim history/subjective:   Remains on the ventilator with low P/F ratio Off Levophed today  Objective   Blood pressure (!) 106/45, pulse 69, temperature 98.1 F (36.7 C), resp. rate (!) 30, height 5\' 7"  (1.702 m), weight 84.8 kg, SpO2 (!) 9 %. CVP:  [9 mmHg-10 mmHg] 10 mmHg  Vent Mode: PRVC FiO2 (%):  [60 %] 60 % Set Rate:  [30 bmp] 30 bmp Vt Set:  [400 mL] 400 mL PEEP:  [10 cmH20] 10 cmH20 Plateau Pressure:  [24 cmH20] 24 cmH20   Intake/Output Summary (Last 24 hours) at 06/06/2020 1631 Last data filed at 06/06/2020 1500 Gross per 24 hour  Intake 3294.4 ml  Output 1775 ml  Net 1519.4 ml   Filed Weights   06/04/20 0000 06/04/20 0348 06/05/20 0500  Weight: 84.5 kg 84.5 kg 84.8 kg    Examination: Gen:      No acute distress HEENT:  EOMI, sclera anicteric Neck:     No masses; no thyromegaly, ETT Lungs:    Clear to auscultation bilaterally; normal respiratory effort CV:         Regular rate and rhythm; no murmurs Abd:      +  bowel sounds; soft, non-tender; no palpable masses, no distension Ext:    No edema; adequate peripheral perfusion Skin:      Warm and dry; no rash Neuro: Sedated  Labs reviewed significant for  Glucose 376, potassium 6, BUN/creatinine 40/1.05 WBC 8, hemoglobin 13.6, platelets 161 Chest x-ray with worsening bilateral airspace opacities.  Resolved Hospital Problem list     Assessment & Plan:  ARDS due to COVID pneumonia: Continue low TV ventilation Continue paralytics, proning as long as P/F ratio is less than 15 Goal driving pressure < 15 Resume lasix as he is off pressors and CVP is 12 Continue solumedrol  Acute metabolic encephalopathy due to ICU delirium, steroid use PAD protocol : RASS target -2 to -3 Fentanyl/versed infusions  DM2 with hyperglycemia linagiptin Lantus. Increase dose to 30 daily Change SSI to resistant scale  Hyperkalemia Lasix x 2 does Lokelma  Constipation miralax  Best practice:  Diet: tube feeding Pain/Anxiety/Delirium protocol (if indicated): see above VAP protocol (if indicated): yes DVT prophylaxis: lovenox > adjust to daily dosing GI prophylaxis: Pantoprazole for stress ulcer prophylaxis Glucose control: SSI, lantus, linagliptin Mobility: bed rest Code Status: full Family Communication: Daughter and wife updated 7/24. Disposition: remain in ICU  Critical care time:    The patient is critically ill with multiple organ system failure  and requires high complexity decision making for assessment and support, frequent evaluation and titration of therapies, advanced monitoring, review of radiographic studies and interpretation of complex data.   Critical Care Time devoted to patient care services, exclusive of separately billable procedures, described in this note is 35 minutes.   Marshell Garfinkel MD [facility] Please see [email or web address] for pager details.  06/06/2020, 4:31 PM

## 2020-06-06 NOTE — Progress Notes (Signed)
Pt proned at 1550 by RT and RNs. RT will draw post prone ABG at 1700 and continue to monitor.

## 2020-06-06 NOTE — Progress Notes (Signed)
[facility] Progress Note Patient Name: Jim Clarke DOB: 12/30/1940 MRN: [id number]   Date of Service  06/06/2020  HPI/Events of Note  Hyperglycemia - Blood glucose = 368.   eICU Interventions  Plan: 1. Restart Insulin IV infusion per [employer] protocol.     Intervention Category Major Interventions: Hyperglycemia - active titration of insulin therapy  [name] 06/06/2020, 4:47 AM

## 2020-06-06 NOTE — Progress Notes (Signed)
Pt unproned at 0855 by RT and RNs.

## 2020-06-07 LAB — BLOOD GAS, ARTERIAL
Acid-Base Excess: 3.5 mmol/L — ABNORMAL HIGH (ref 0.0–2.0)
Acid-Base Excess: 7.8 mmol/L — ABNORMAL HIGH (ref 0.0–2.0)
Bicarbonate: 31.6 mmol/L — ABNORMAL HIGH (ref 20.0–28.0)
Bicarbonate: 37.8 mmol/L — ABNORMAL HIGH (ref 20.0–28.0)
Drawn by: 11249
FIO2: 60
FIO2: 60
MECHVT: 400 mL
MECHVT: 400 mL
O2 Saturation: 97 %
O2 Saturation: 91 %
PEEP: 10 cmH2O
PEEP: 10 cmH2O
Patient temperature: 98.4
Patient temperature: 98.6
RATE: 30 resp/min
pCO2 arterial: 65.4 mmHg (ref 32.0–48.0)
pCO2 arterial: 80.3 mmHg (ref 32.0–48.0)
pH, Arterial: 7.304 — ABNORMAL LOW (ref 7.350–7.450)
pH, Arterial: 7.295 — ABNORMAL LOW (ref 7.350–7.450)
pO2, Arterial: 94.7 mmHg (ref 83.0–108.0)
pO2, Arterial: 68.2 mmHg — ABNORMAL LOW (ref 83.0–108.0)

## 2020-06-07 LAB — BASIC METABOLIC PANEL
Anion gap: 9 (ref 5–15)
Anion gap: 13 (ref 5–15)
BUN: 40 mg/dL — ABNORMAL HIGH (ref 8–23)
BUN: 46 mg/dL — ABNORMAL HIGH (ref 8–23)
CO2: 31 mmol/L (ref 22–32)
CO2: 34 mmol/L — ABNORMAL HIGH (ref 22–32)
Calcium: 8.1 mg/dL — ABNORMAL LOW (ref 8.9–10.3)
Calcium: 8.4 mg/dL — ABNORMAL LOW (ref 8.9–10.3)
Chloride: 98 mmol/L (ref 98–111)
Chloride: 91 mmol/L — ABNORMAL LOW (ref 98–111)
Creatinine, Ser: 1.02 mg/dL (ref 0.61–1.24)
Creatinine, Ser: 1.12 mg/dL (ref 0.61–1.24)
GFR calc Af Amer: >60 mL/min (ref >60)
GFR calc Af Amer: >60 mL/min (ref >60)
GFR calc non Af Amer: >60 mL/min (ref >60)
GFR calc non Af Amer: >60 mL/min (ref >60)
Glucose, Bld: 366 mg/dL — ABNORMAL HIGH (ref 70–99)
Glucose, Bld: 271 mg/dL — ABNORMAL HIGH (ref 70–99)
Potassium: 6.3 mmol/L (ref 3.5–5.1)
Potassium: 4.5 mmol/L (ref 3.5–5.1)
Sodium: 138 mmol/L (ref 135–145)
Sodium: 138 mmol/L (ref 135–145)

## 2020-06-07 LAB — CBC
HCT: 45.3 % (ref 39.0–52.0)
Hemoglobin: 14.3 g/dL (ref 13.0–17.0)
MCH: 31.8 pg (ref 26.0–34.0)
MCHC: 31.6 g/dL (ref 30.0–36.0)
MCV: 100.7 fL — ABNORMAL HIGH (ref 80.0–100.0)
Platelets: 170 10*3/uL (ref 150–400)
RBC: 4.5 MIL/uL (ref 4.22–5.81)
RDW: 14.1 % (ref 11.5–15.5)
WBC: 8.7 10*3/uL (ref 4.0–10.5)
nRBC: 0 % (ref 0.0–0.2)

## 2020-06-07 LAB — GLUCOSE, CAPILLARY
Glucose-Capillary: 130 mg/dL — ABNORMAL HIGH (ref 70–99)
Glucose-Capillary: 139 mg/dL — ABNORMAL HIGH (ref 70–99)
Glucose-Capillary: 148 mg/dL — ABNORMAL HIGH (ref 70–99)
Glucose-Capillary: 152 mg/dL — ABNORMAL HIGH (ref 70–99)
Glucose-Capillary: 153 mg/dL — ABNORMAL HIGH (ref 70–99)
Glucose-Capillary: 166 mg/dL — ABNORMAL HIGH (ref 70–99)
Glucose-Capillary: 167 mg/dL — ABNORMAL HIGH (ref 70–99)
Glucose-Capillary: 170 mg/dL — ABNORMAL HIGH (ref 70–99)
Glucose-Capillary: 172 mg/dL — ABNORMAL HIGH (ref 70–99)
Glucose-Capillary: 185 mg/dL — ABNORMAL HIGH (ref 70–99)
Glucose-Capillary: 206 mg/dL — ABNORMAL HIGH (ref 70–99)
Glucose-Capillary: 207 mg/dL — ABNORMAL HIGH (ref 70–99)
Glucose-Capillary: 215 mg/dL — ABNORMAL HIGH (ref 70–99)
Glucose-Capillary: 216 mg/dL — ABNORMAL HIGH (ref 70–99)
Glucose-Capillary: 217 mg/dL — ABNORMAL HIGH (ref 70–99)
Glucose-Capillary: 219 mg/dL — ABNORMAL HIGH (ref 70–99)
Glucose-Capillary: 236 mg/dL — ABNORMAL HIGH (ref 70–99)
Glucose-Capillary: 239 mg/dL — ABNORMAL HIGH (ref 70–99)
Glucose-Capillary: 264 mg/dL — ABNORMAL HIGH (ref 70–99)
Glucose-Capillary: 264 mg/dL — ABNORMAL HIGH (ref 70–99)
Glucose-Capillary: 269 mg/dL — ABNORMAL HIGH (ref 70–99)
Glucose-Capillary: 297 mg/dL — ABNORMAL HIGH (ref 70–99)
Glucose-Capillary: 301 mg/dL — ABNORMAL HIGH (ref 70–99)
Glucose-Capillary: 315 mg/dL — ABNORMAL HIGH (ref 70–99)
Glucose-Capillary: 318 mg/dL — ABNORMAL HIGH (ref 70–99)
Glucose-Capillary: 319 mg/dL — ABNORMAL HIGH (ref 70–99)
Glucose-Capillary: 335 mg/dL — ABNORMAL HIGH (ref 70–99)
Glucose-Capillary: 356 mg/dL — ABNORMAL HIGH (ref 70–99)
Glucose-Capillary: 368 mg/dL — ABNORMAL HIGH (ref 70–99)
Glucose-Capillary: 371 mg/dL — ABNORMAL HIGH (ref 70–99)
Glucose-Capillary: 69 mg/dL — ABNORMAL LOW (ref 70–99)
Glucose-Capillary: 307 mg/dL — ABNORMAL HIGH (ref 70–99)
Glucose-Capillary: 237 mg/dL — ABNORMAL HIGH (ref 70–99)

## 2020-06-07 LAB — TRIGLYCERIDES: Triglycerides: 287 mg/dL — ABNORMAL HIGH (ref <150)

## 2020-06-07 LAB — MAGNESIUM: Magnesium: 2 mg/dL (ref 1.7–2.4)

## 2020-06-07 LAB — PHOSPHORUS: Phosphorus: 3.5 mg/dL (ref 2.5–4.6)

## 2020-06-07 MED ORDER — SODIUM ZIRCONIUM CYCLOSILICATE 10 G PO PACK
10.0000 g | PACK | Freq: Two times a day (BID) | ORAL | Status: AC
  Administered 2020-06-07 (×2): 10 g via ORAL
  Filled 2020-06-07 (×2): qty 1

## 2020-06-07 MED ORDER — FUROSEMIDE 10 MG/ML IJ SOLN
40.0000 mg | Freq: Two times a day (BID) | INTRAMUSCULAR | Status: DC
  Administered 2020-06-07 – 2020-06-10 (×7): 40 mg via INTRAVENOUS
  Filled 2020-06-07 (×7): qty 4

## 2020-06-07 MED ORDER — SODIUM ZIRCONIUM CYCLOSILICATE 10 G PO PACK
10.0000 g | PACK | Freq: Once | ORAL | Status: AC
  Administered 2020-06-07: 10 g
  Filled 2020-06-07: qty 1

## 2020-06-07 MED ORDER — LINAGLIPTIN 5 MG PO TABS
5.0000 mg | ORAL_TABLET | Freq: Every day | ORAL | Status: DC
  Administered 2020-06-07 – 2020-06-09 (×3): 5 mg via ORAL
  Filled 2020-06-07 (×3): qty 1

## 2020-06-07 MED ORDER — DEXTROSE 50 % IV SOLN
1.0000 | Freq: Once | INTRAVENOUS | Status: AC
  Administered 2020-06-07: 50 mL via INTRAVENOUS
  Filled 2020-06-07: qty 50

## 2020-06-07 MED ORDER — INSULIN ASPART 100 UNIT/ML IV SOLN
10.0000 [IU] | Freq: Once | INTRAVENOUS | Status: AC
  Administered 2020-06-07: 10 [IU] via INTRAVENOUS

## 2020-06-07 NOTE — Progress Notes (Signed)
OT Cancellation Note  Patient Details Name: KAWIKA BISCHOFF MRN: [id number] DOB: [birth year]   Cancelled Treatment:    Reason Eval/Treat Not Completed: Other (comment);Patient not medically ready. Pt continues to be sedated and intubated. OT signing off at this time. Please reorder when medically appropriate. Thank you.   Ramond Dial, OT/L   Acute OT Clinical Specialist Acute Rehabilitation Services Pager [phone] Office [phone]  06/07/2020, 8:52 AM

## 2020-06-07 NOTE — Progress Notes (Signed)
[place] Progress Note Patient Name: Jim Clarke DOB: 25-Mar-1941 MRN: [id number]   Date of Service  06/07/2020  HPI/Events of Note  Hyperkalemia - K+ = 6.3.  eICU Interventions  Plan: 1. Lokelma 10 gm per tube now. 2. Repeat BMP at 12 noon.      Intervention Category Major Interventions: Electrolyte abnormality - evaluation and management  Sanita Estrada Eugene 06/07/2020, 6:12 AM

## 2020-06-07 NOTE — Progress Notes (Signed)
Assisted tele visit to patient with family member.  Aelyn Stanaland P, RN  

## 2020-06-07 NOTE — Progress Notes (Signed)
CRITICAL VALUE ALERT  Critical Value:  Potassium at 6.3  Date & Time Notied:  06/07/20 0600am  Provider Notified:Elink notified  Orders Received/Actions taken: waiting for orders, RN will continue to monitor

## 2020-06-07 NOTE — Progress Notes (Signed)
PT Cancellation Note / Sign off  Patient Details Name: Jim Clarke MRN: [id number] DOB: [birth year]   Cancelled Treatment:    Reason Eval/Treat Not Completed: Medical issues which prohibited therapy Pt remains intubated and sedated.  Please re-order PT when pt able to participate.   Caryn Gienger,KATHrine E 06/07/2020, 9:02 AM Arlyce Dice, DPT Acute Rehabilitation Services Pager: [phone] Office: [phone]

## 2020-06-07 NOTE — Progress Notes (Addendum)
NAME:  Jim Clarke, MRN:  [id number], DOB:  Sep 14, 1941, LOS: 9 ADMISSION DATE:  06/05/2020, CONSULTATION DATE:  7/23 REFERRING MD:  Bonner Puna, CHIEF COMPLAINT:  Dyspnea, confusion   Brief History   79 y/o male admitted with COVID pneumonia on 7/17 after having symptoms for about 10 days.  He was treated with heated high flow, actemra, remdesivir and systemic steroids.  PCCM consulted on 7/23 in the setting of worsening agitated delirium and oxygenation.   Past Medical History  Hyeprtension GERD Hyperlipidemia DM2 Arthritis  Significant Hospital Events   7/17 Admission 7/23 Intubation, paralysed 7/24 Started proning 7/25 Off Levophed  Consults:  PCCM  Procedures:  7/23 ETT >  7/23 Left subclavian central line >  Radial A line 7/24 >>  Significant Diagnostic Tests:    Micro Data:  7/17 SARS COV 2 > positive 7/17 blood culture >   Antimicrobials/COVID Rx:  7/17 actemra > 7/18 7/17 remdesivir > 7/21 7/17 methylprednisolone >   Interim history/subjective:   Off pressors.  Proned.  No acute events overnight.  Objective   Blood pressure (!) 163/82, pulse 100, temperature 98.6 F (37 C), resp. rate (!) 30, height 5\' 7"  (1.702 m), weight 82.9 kg, SpO2 91 %. CVP:  [9 mmHg-12 mmHg] 11 mmHg  Vent Mode: PRVC FiO2 (%):  [60 %] 60 % Set Rate:  [30 bmp] 30 bmp Vt Set:  [400 mL] 400 mL PEEP:  [10 cmH20] 10 cmH20 Plateau Pressure:  [24 cmH20-26 cmH20] 25 cmH20   Intake/Output Summary (Last 24 hours) at 06/07/2020 0949 Last data filed at 06/07/2020 0800 Gross per 24 hour  Intake 3101.89 ml  Output 5150 ml  Net -2048.11 ml   Filed Weights   06/04/20 0348 06/05/20 0500 06/07/20 0500  Weight: 84.5 kg 84.8 kg 82.9 kg    Examination: Gen:      No acute distress HEENT:  EOMI, sclera anicteric Neck:     No masses; no thyromegaly, ETT Lungs:    Clear to auscultation bilaterally; normal respiratory effort CV:         Regular rate and rhythm; no murmurs Abd:      + bowel  sounds; soft, non-tender; no palpable masses, no distension Ext:    No edema; adequate peripheral perfusion Skin:      Warm and dry; no rash Neuro: Sedated  Labs reviewed significant for   potassium 6.3, BUN/creatinine 40/1.02, glucose 366  Resolved Hospital Problem list     Assessment & Plan:  ARDS due to COVID pneumonia: Continue low TV ventilation Continue  proning as long as P/F ratio is less than 15 Get off paralytics today Goal driving pressure < 15 Lasix. CVP is 14 Continue solumedrol  Acute metabolic encephalopathy due to ICU delirium, steroid use PAD protocol : RASS target -2 to -3 Fentanyl/versed infusions  DM2 with hyperglycemia linagiptin Lantus. Increase dose to 30 daily Change SSI to resistant scale  Hyperkalemia Continue Lasix as above Insulin, dextrose Lokelma Repeat labs in afternoon  Best practice:  Diet: tube feeding Pain/Anxiety/Delirium protocol (if indicated): see above VAP protocol (if indicated): yes DVT prophylaxis: lovenox > adjust to daily dosing GI prophylaxis: Pantoprazole for stress ulcer prophylaxis Glucose control: SSI, lantus, linagliptin Mobility: bed rest Code Status: full Family Communication: Daughter and wife updated daily. Last updated 7/26. Disposition: remain in ICU  Critical care time:    The patient is critically ill with multiple organ system failure and requires high complexity decision making for assessment and support, frequent  evaluation and titration of therapies, advanced monitoring, review of radiographic studies and interpretation of complex data.   Critical Care Time devoted to patient care services, exclusive of separately billable procedures, described in this note is 35 minutes.   Marshell Garfinkel MD [facility] Please see [email or web address] for pager details.  06/07/2020, 9:49 AM

## 2020-06-07 NOTE — TOC Progression Note (Signed)
Transition of Care [facility]) - Progression Note    Patient Details  Name: Jim Clarke MRN: [id number] Date of Birth: 06/01/1941  Transition of Care [facility]) CM/SW Contact  Leeroy Cha, RN Phone Number: 06/07/2020, 9:46 AM  Clinical Narrative:    s-remains on the vent at 65%, iv solumedrol, FentaNYL,Levophed, iv insluin, Norcuron. Plan-following for toc needs and progression, remains full code.   Expected Discharge Plan: Home/Self Care Barriers to Discharge: Continued Medical Work up  Expected Discharge Plan and Services Expected Discharge Plan: Home/Self Care   Discharge Planning Services: CM Consult   Living arrangements for the past 2 months: Single Family Home                                       Social Determinants of Health (SDOH) Interventions    Readmission Risk Interventions No flowsheet data found.

## 2020-06-07 NOTE — Progress Notes (Signed)
Video call made to pt's daughter and wife. Updated of pt condition and plan of care. They were appreciative of care and video call.

## 2020-06-07 NOTE — Progress Notes (Signed)
CRITICAL VALUE ALERT  Critical Value:  CO2 = 80.3  Date & Time Notied:  06/07/2020 1611  Provider Notified: Dr. Vaughan Browner  Orders Received/Actions taken: Put pt in prone position

## 2020-06-08 ENCOUNTER — Inpatient Hospital Stay (HOSPITAL_COMMUNITY): Payer: Medicare HMO

## 2020-06-08 LAB — BLOOD GAS, ARTERIAL
Acid-Base Excess: 13.4 mmol/L — ABNORMAL HIGH (ref 0.0–2.0)
Bicarbonate: 42.1 mmol/L — ABNORMAL HIGH (ref 20.0–28.0)
Drawn by: 29503
FIO2: 70
MECHVT: 400 mL
O2 Saturation: 95.6 %
PEEP: 10 cmH2O
Patient temperature: 98.6
RATE: 30 resp/min
pCO2 arterial: 71.6 mmHg (ref 32.0–48.0)
pH, Arterial: 7.387 (ref 7.350–7.450)
pO2, Arterial: 84.3 mmHg (ref 83.0–108.0)

## 2020-06-08 LAB — CBC
HCT: 44.8 % (ref 39.0–52.0)
Hemoglobin: 14.2 g/dL (ref 13.0–17.0)
MCH: 31.4 pg (ref 26.0–34.0)
MCHC: 31.7 g/dL (ref 30.0–36.0)
MCV: 99.1 fL (ref 80.0–100.0)
Platelets: 158 10*3/uL (ref 150–400)
RBC: 4.52 MIL/uL (ref 4.22–5.81)
RDW: 14.2 % (ref 11.5–15.5)
WBC: 11 10*3/uL — ABNORMAL HIGH (ref 4.0–10.5)
nRBC: 0 % (ref 0.0–0.2)

## 2020-06-08 LAB — GLUCOSE, CAPILLARY
Glucose-Capillary: 127 mg/dL — ABNORMAL HIGH (ref 70–99)
Glucose-Capillary: 164 mg/dL — ABNORMAL HIGH (ref 70–99)
Glucose-Capillary: 244 mg/dL — ABNORMAL HIGH (ref 70–99)
Glucose-Capillary: 289 mg/dL — ABNORMAL HIGH (ref 70–99)
Glucose-Capillary: 317 mg/dL — ABNORMAL HIGH (ref 70–99)
Glucose-Capillary: 105 mg/dL — ABNORMAL HIGH (ref 70–99)
Glucose-Capillary: 168 mg/dL — ABNORMAL HIGH (ref 70–99)
Glucose-Capillary: 269 mg/dL — ABNORMAL HIGH (ref 70–99)
Glucose-Capillary: 274 mg/dL — ABNORMAL HIGH (ref 70–99)
Glucose-Capillary: 361 mg/dL — ABNORMAL HIGH (ref 70–99)
Glucose-Capillary: 366 mg/dL — ABNORMAL HIGH (ref 70–99)
Glucose-Capillary: 380 mg/dL — ABNORMAL HIGH (ref 70–99)
Glucose-Capillary: 407 mg/dL — ABNORMAL HIGH (ref 70–99)
Glucose-Capillary: 71 mg/dL (ref 70–99)

## 2020-06-08 LAB — BASIC METABOLIC PANEL
Anion gap: 10 (ref 5–15)
Anion gap: 11 (ref 5–15)
BUN: 54 mg/dL — ABNORMAL HIGH (ref 8–23)
BUN: 58 mg/dL — ABNORMAL HIGH (ref 8–23)
CO2: 38 mmol/L — ABNORMAL HIGH (ref 22–32)
CO2: 38 mmol/L — ABNORMAL HIGH (ref 22–32)
Calcium: 8.4 mg/dL — ABNORMAL LOW (ref 8.9–10.3)
Calcium: 8.2 mg/dL — ABNORMAL LOW (ref 8.9–10.3)
Chloride: 89 mmol/L — ABNORMAL LOW (ref 98–111)
Chloride: 93 mmol/L — ABNORMAL LOW (ref 98–111)
Creatinine, Ser: 1.06 mg/dL (ref 0.61–1.24)
Creatinine, Ser: 0.89 mg/dL (ref 0.61–1.24)
GFR calc Af Amer: >60 mL/min (ref >60)
GFR calc Af Amer: >60 mL/min (ref >60)
GFR calc non Af Amer: >60 mL/min (ref >60)
GFR calc non Af Amer: >60 mL/min (ref >60)
Glucose, Bld: 278 mg/dL — ABNORMAL HIGH (ref 70–99)
Glucose, Bld: 93 mg/dL (ref 70–99)
Potassium: 5.4 mmol/L — ABNORMAL HIGH (ref 3.5–5.1)
Potassium: 3.9 mmol/L (ref 3.5–5.1)
Sodium: 137 mmol/L (ref 135–145)
Sodium: 142 mmol/L (ref 135–145)

## 2020-06-08 LAB — MAGNESIUM: Magnesium: 2 mg/dL (ref 1.7–2.4)

## 2020-06-08 LAB — TRIGLYCERIDES: Triglycerides: 361 mg/dL — ABNORMAL HIGH (ref <150)

## 2020-06-08 LAB — PHOSPHORUS: Phosphorus: 3.8 mg/dL (ref 2.5–4.6)

## 2020-06-08 MED ORDER — INSULIN GLARGINE 100 UNIT/ML ~~LOC~~ SOLN
35.0000 [IU] | Freq: Every day | SUBCUTANEOUS | Status: DC
  Administered 2020-06-09: 35 [IU] via SUBCUTANEOUS
  Filled 2020-06-08: qty 0.35

## 2020-06-08 MED ORDER — SODIUM ZIRCONIUM CYCLOSILICATE 10 G PO PACK
10.0000 g | PACK | Freq: Once | ORAL | Status: AC
  Administered 2020-06-08: 10 g via ORAL
  Filled 2020-06-08: qty 1

## 2020-06-08 MED ORDER — MIDAZOLAM HCL 2 MG/2ML IJ SOLN
2.0000 mg | INTRAMUSCULAR | Status: DC | PRN
  Administered 2020-06-08 – 2020-06-12 (×5): 2 mg via INTRAVENOUS
  Filled 2020-06-08 (×3): qty 2

## 2020-06-08 MED ORDER — SODIUM ZIRCONIUM CYCLOSILICATE 10 G PO PACK
10.0000 g | PACK | Freq: Once | ORAL | Status: AC
  Administered 2020-06-08: 10 g
  Filled 2020-06-08: qty 1

## 2020-06-08 MED ORDER — FENTANYL 2500MCG IN NS 250ML (10MCG/ML) PREMIX INFUSION
25.0000 ug/h | INTRAVENOUS | Status: DC
  Administered 2020-06-08 – 2020-06-09 (×2): 200 ug/h via INTRAVENOUS
  Filled 2020-06-08 (×3): qty 250

## 2020-06-08 MED ORDER — MIDAZOLAM 50MG/50ML (1MG/ML) PREMIX INFUSION
0.5000 mg/h | INTRAVENOUS | Status: DC
  Administered 2020-06-08: 4 mg/h via INTRAVENOUS
  Administered 2020-06-08: 5 mg/h via INTRAVENOUS
  Administered 2020-06-09: 6 mg/h via INTRAVENOUS
  Administered 2020-06-09 – 2020-06-10 (×2): 5 mg/h via INTRAVENOUS
  Filled 2020-06-08 (×7): qty 50

## 2020-06-08 NOTE — Progress Notes (Signed)
[place] Progress Note Patient Name: Jim Clarke DOB: August 03, 1941 MRN: [id number]   Date of Service  06/08/2020  HPI/Events of Note  Nursing request for AM CXR.  eICU Interventions  Plan: 1. Portable CXR at 5 AM.     Intervention Category Major Interventions: Other:  Aloys Hupfer Cornelia Copa 06/08/2020, 1:52 AM

## 2020-06-08 NOTE — Progress Notes (Signed)
NAME:  Jim Clarke, MRN:  [id number], DOB:  03-Nov-1941, LOS: 61 ADMISSION DATE:  06/12/2020, CONSULTATION DATE:  7/23 REFERRING MD:  Bonner Puna, CHIEF COMPLAINT:  Dyspnea, confusion   Brief History   79 y/o male admitted with COVID pneumonia on 7/17 after having symptoms for about 10 days.  He was treated with heated high flow, actemra, remdesivir and systemic steroids.  PCCM consulted on 7/23 in the setting of worsening agitated delirium and oxygenation.   Past Medical History  Hyeprtension GERD Hyperlipidemia DM2 Arthritis  Significant Hospital Events   7/17 Admission 7/23 Intubation, paralysed 7/24 Started proning 7/25 Off Levophed 7/26 Off paralytics, proning continued  Consults:  PCCM  Procedures:  7/23 ETT >  7/23 Left subclavian central line >  Radial A line 7/24 >>  Significant Diagnostic Tests:    Micro Data:  7/17 SARS COV 2 > positive 7/17 blood culture >   Antimicrobials/COVID Rx:  7/17 actemra > 7/18 7/17 remdesivir > 7/21 7/17 methylprednisolone >   Interim history/subjective:   Off pressors. Remains proned. No improvement in oxygenation  Objective   Blood pressure (!) 121/62, pulse 97, temperature 97.9 F (36.6 C), resp. rate (!) 30, height 5\' 7"  (1.702 m), weight 82 kg, SpO2 92 %. CVP:  [7 mmHg-13 mmHg] 9 mmHg  Vent Mode: PRVC FiO2 (%):  [60 %-100 %] 60 % Set Rate:  [30 bmp] 30 bmp Vt Set:  [400 mL] 400 mL PEEP:  [10 cmH20] 10 cmH20 Plateau Pressure:  [22 cmH20-25 cmH20] 24 cmH20   Intake/Output Summary (Last 24 hours) at 06/08/2020 0939 Last data filed at 06/08/2020 0934 Gross per 24 hour  Intake 2888.79 ml  Output 3355 ml  Net -466.21 ml   Filed Weights   06/05/20 0500 06/07/20 0500 06/08/20 0500  Weight: 84.8 kg 82.9 kg 82 kg    Examination: Gen:      No acute distress HEENT:  EOMI, sclera anicteric Neck:     No masses; no thyromegaly, ETT Lungs:    Clear to auscultation bilaterally; normal respiratory effort CV:         Regular  rate and rhythm; no murmurs Abd:      + bowel sounds; soft, non-tender; no palpable masses, no distension Ext:    No edema; adequate peripheral perfusion Skin:      Warm and dry; no rash Neuro: Sedated  Labs reviewed significant for  Glucose 237, potassium 5.4, BUN/creatinine 54/1.06 WBC 11 Chest x-ray 7/27-stable diffuse pulmonary infiltrates.  Resolved Hospital Problem list     Assessment & Plan:  ARDS due to COVID pneumonia: Continue low TV ventilation Continue  proning as long as P/F ratio is less than 150 Goal driving pressure < 15 Continue lasix, solumedrol  Acute metabolic encephalopathy due to ICU delirium, steroid use PAD protocol : RASS target -2 to -3 Triglycerides elevated.  DC propofol Continue fentanyl  DM2 with hyperglycemia linagiptin Lantus. Increase dose to 35 daily SSI resistant scale  Hyperkalemia Lasix, insulin as above [employer] practice:  Diet: Tube feeding Pain/Anxiety/Delirium protocol (if indicated): see above VAP protocol (if indicated): yes DVT prophylaxis: Lovenox GI prophylaxis: Pantoprazole for stress ulcer prophylaxis Glucose control: SSI, lantus, linagliptin Mobility: Bed rest Code Status: full Family Communication: Daughter and wife updated daily.  Disposition: Remain in ICU  Critical care time:    The patient is critically ill with multiple organ system failure and requires high complexity decision making for assessment and support, frequent evaluation and titration of therapies,  advanced monitoring, review of radiographic studies and interpretation of complex data.   Critical Care Time devoted to patient care services, exclusive of separately billable procedures, described in this note is 35 minutes.   Marshell Garfinkel MD [facility] Please see [email or web address] for pager details.  06/08/2020, 9:39 AM

## 2020-06-08 NOTE — Progress Notes (Signed)
Inpatient Diabetes Program Recommendations  AACE/ADA: New Consensus Statement on Inpatient Glycemic Control (2015)  Target Ranges:  Prepandial:   less than 140 mg/dL      Peak postprandial:   less than 180 mg/dL (1-2 hours)      Critically ill patients:  140 - 180 mg/dL   Lab Results  Component Value Date   GLUCAP 237 (H) 06/07/2020   HGBA1C 7.5 (H) 05/31/2020    Review of Glycemic Control  Blood sugars above goal of < 180 mg/dL. Would likely benefit from Novolog TF coverage.  Inpatient Diabetes Program Recommendations:     Add Novolog 4 units Q4H for TF coverage. HOLD if TFs are held for any reason.   Will continue to follow.   Thank you. Lorenda Peck, RD, LDN, CDE Inpatient Diabetes Coordinator [phone]

## 2020-06-08 NOTE — Progress Notes (Signed)
CRITICAL VALUE ALERT  Critical Value:  ABG PCO2- 71.6  Date & Time Notied:  06/08/20 1605  Provider Notified: Dr. Vaughan Browner  Orders Received/Actions taken: None at this time.

## 2020-06-08 NOTE — Progress Notes (Signed)
Latest ABG results reported to Dr. Vaughan Browner. Informed of critical CO2. Per MD, prone pt today at 2000.

## 2020-06-08 NOTE — Progress Notes (Signed)
[place] Progress Note Patient Name: Jim Clarke DOB: [birth year] MRN: [id number]   Date of Service  06/08/2020  HPI/Events of Note  Hyperkalemia - K+ = 5.4.   eICU Interventions  Plan: 1. Lokema 10 gm per tube now.  2. Repeat BMP at 11 AM.     Intervention Category Major Interventions: Electrolyte abnormality - evaluation and management  Michaela Shankel Eugene 06/08/2020, 4:34 AM

## 2020-06-08 NOTE — Progress Notes (Signed)
Pt ET tube secured with cloth tape prior to proning pt.  Pt tolerated well, no apparent complications.  RT to monitor and assess as needed.

## 2020-06-09 ENCOUNTER — Inpatient Hospital Stay (HOSPITAL_COMMUNITY): Payer: Medicare HMO

## 2020-06-09 ENCOUNTER — Telehealth: Payer: Self-pay | Admitting: Family Medicine

## 2020-06-09 DIAGNOSIS — U071 COVID-19: Secondary | ICD-10-CM | POA: Diagnosis not present

## 2020-06-09 DIAGNOSIS — K219 Gastro-esophageal reflux disease without esophagitis: Secondary | ICD-10-CM | POA: Diagnosis not present

## 2020-06-09 DIAGNOSIS — E440 Moderate protein-calorie malnutrition: Secondary | ICD-10-CM | POA: Insufficient documentation

## 2020-06-09 DIAGNOSIS — J069 Acute upper respiratory infection, unspecified: Secondary | ICD-10-CM | POA: Diagnosis not present

## 2020-06-09 DIAGNOSIS — J9601 Acute respiratory failure with hypoxia: Secondary | ICD-10-CM | POA: Diagnosis not present

## 2020-06-09 LAB — CBC
HCT: 46.3 % (ref 39.0–52.0)
Hemoglobin: 14.8 g/dL (ref 13.0–17.0)
MCH: 31.4 pg (ref 26.0–34.0)
MCHC: 32 g/dL (ref 30.0–36.0)
MCV: 98.3 fL (ref 80.0–100.0)
Platelets: 125 10*3/uL — ABNORMAL LOW (ref 150–400)
RBC: 4.71 MIL/uL (ref 4.22–5.81)
RDW: 14.2 % (ref 11.5–15.5)
WBC: 13.4 10*3/uL — ABNORMAL HIGH (ref 4.0–10.5)
nRBC: 0 % (ref 0.0–0.2)

## 2020-06-09 LAB — BLOOD GAS, ARTERIAL
Acid-Base Excess: 13.6 mmol/L — ABNORMAL HIGH (ref 0.0–2.0)
Acid-Base Excess: 12.9 mmol/L — ABNORMAL HIGH (ref 0.0–2.0)
Bicarbonate: 40.1 mmol/L — ABNORMAL HIGH (ref 20.0–28.0)
Bicarbonate: 41.8 mmol/L — ABNORMAL HIGH (ref 20.0–28.0)
Drawn by: 29503
FIO2: 70
FIO2: 60
MECHVT: 400 mL
O2 Saturation: 93.8 %
O2 Saturation: 87.9 %
PEEP: 10 cmH2O
Patient temperature: 99.1
Patient temperature: 98.6
RATE: 30 resp/min
pCO2 arterial: 57.4 mmHg — ABNORMAL HIGH (ref 32.0–48.0)
pCO2 arterial: 62.5 mmHg — ABNORMAL HIGH (ref 32.0–48.0)
pH, Arterial: 7.459 — ABNORMAL HIGH (ref 7.350–7.450)
pH, Arterial: 7.441 (ref 7.350–7.450)
pO2, Arterial: 68.8 mmHg — ABNORMAL LOW (ref 83.0–108.0)
pO2, Arterial: 56.8 mmHg — ABNORMAL LOW (ref 83.0–108.0)

## 2020-06-09 LAB — BASIC METABOLIC PANEL
Anion gap: 14 (ref 5–15)
BUN: 63 mg/dL — ABNORMAL HIGH (ref 8–23)
CO2: 39 mmol/L — ABNORMAL HIGH (ref 22–32)
Calcium: 8.6 mg/dL — ABNORMAL LOW (ref 8.9–10.3)
Chloride: 87 mmol/L — ABNORMAL LOW (ref 98–111)
Creatinine, Ser: 0.92 mg/dL (ref 0.61–1.24)
GFR calc Af Amer: >60 mL/min (ref >60)
GFR calc non Af Amer: >60 mL/min (ref >60)
Glucose, Bld: 351 mg/dL — ABNORMAL HIGH (ref 70–99)
Potassium: 4.6 mmol/L (ref 3.5–5.1)
Sodium: 140 mmol/L (ref 135–145)

## 2020-06-09 LAB — GLUCOSE, CAPILLARY
Glucose-Capillary: 90 mg/dL (ref 70–99)
Glucose-Capillary: 84 mg/dL (ref 70–99)
Glucose-Capillary: 84 mg/dL (ref 70–99)
Glucose-Capillary: 104 mg/dL — ABNORMAL HIGH (ref 70–99)
Glucose-Capillary: 322 mg/dL — ABNORMAL HIGH (ref 70–99)
Glucose-Capillary: 287 mg/dL — ABNORMAL HIGH (ref 70–99)
Glucose-Capillary: 211 mg/dL — ABNORMAL HIGH (ref 70–99)
Glucose-Capillary: 151 mg/dL — ABNORMAL HIGH (ref 70–99)

## 2020-06-09 LAB — MAGNESIUM: Magnesium: 2.1 mg/dL (ref 1.7–2.4)

## 2020-06-09 LAB — PHOSPHORUS: Phosphorus: 4.6 mg/dL (ref 2.5–4.6)

## 2020-06-09 MED ORDER — DEXTROSE-NACL 5-0.45 % IV SOLN: INTRAVENOUS | Status: DC

## 2020-06-09 MED ORDER — LINAGLIPTIN 5 MG PO TABS
5.0000 mg | ORAL_TABLET | Freq: Every day | ORAL | Status: DC
  Administered 2020-06-10 – 2020-06-20 (×11): 5 mg
  Filled 2020-06-09 (×13): qty 1

## 2020-06-09 MED ORDER — INSULIN GLARGINE 100 UNIT/ML ~~LOC~~ SOLN
35.0000 [IU] | Freq: Every day | SUBCUTANEOUS | Status: DC
  Administered 2020-06-09 – 2020-06-19 (×11): 35 [IU] via SUBCUTANEOUS
  Filled 2020-06-09 (×13): qty 0.35

## 2020-06-09 MED ORDER — SODIUM CHLORIDE 0.9 % IV SOLN: INTRAVENOUS | Status: DC

## 2020-06-09 MED ORDER — ADULT MULTIVITAMIN W/MINERALS CH
1.0000 | ORAL_TABLET | Freq: Every day | ORAL | Status: DC
  Administered 2020-06-10 – 2020-06-21 (×12): 1
  Filled 2020-06-09 (×12): qty 1

## 2020-06-09 MED ORDER — FENTANYL 2500MCG IN NS 250ML (10MCG/ML) PREMIX INFUSION
0.0000 ug/h | INTRAVENOUS | Status: DC
  Administered 2020-06-09: 175 ug/h via INTRAVENOUS
  Administered 2020-06-09: 150 ug/h via INTRAVENOUS
  Administered 2020-06-10: 175 ug/h via INTRAVENOUS
  Administered 2020-06-10: 200 ug/h via INTRAVENOUS
  Administered 2020-06-11 (×2): 175 ug/h via INTRAVENOUS
  Administered 2020-06-12 (×2): 350 ug/h via INTRAVENOUS
  Administered 2020-06-12: 400 ug/h via INTRAVENOUS
  Administered 2020-06-13: 350 ug/h via INTRAVENOUS
  Administered 2020-06-13 – 2020-06-14 (×3): 400 ug/h via INTRAVENOUS
  Administered 2020-06-14: 300 ug/h via INTRAVENOUS
  Administered 2020-06-14: 400 ug/h via INTRAVENOUS
  Administered 2020-06-15 (×2): 125 ug/h via INTRAVENOUS
  Administered 2020-06-15: 275 ug/h via INTRAVENOUS
  Administered 2020-06-16: 125 ug/h via INTRAVENOUS
  Administered 2020-06-17: 200 ug/h via INTRAVENOUS
  Administered 2020-06-17: 250 ug/h via INTRAVENOUS
  Administered 2020-06-18: 125 ug/h via INTRAVENOUS
  Administered 2020-06-18 – 2020-06-20 (×3): 150 ug/h via INTRAVENOUS
  Administered 2020-06-20: 200 ug/h via INTRAVENOUS
  Administered 2020-06-21: 150 ug/h via INTRAVENOUS
  Administered 2020-06-22: 200 ug/h via INTRAVENOUS
  Filled 2020-06-09 (×25): qty 250
  Filled 2020-06-09: qty 500
  Filled 2020-06-09 (×2): qty 250

## 2020-06-09 MED ORDER — VITAL 1.5 CAL PO LIQD
1000.0000 mL | ORAL | Status: AC
  Administered 2020-06-09 – 2020-06-14 (×6): 1000 mL
  Filled 2020-06-09 (×8): qty 1000

## 2020-06-09 MED ORDER — INSULIN REGULAR(HUMAN) IN NACL 100-0.9 UT/100ML-% IV SOLN: INTRAVENOUS | Status: DC

## 2020-06-09 MED ORDER — DOCUSATE SODIUM 50 MG/5ML PO LIQD
100.0000 mg | Freq: Two times a day (BID) | ORAL | Status: DC
  Administered 2020-06-09 – 2020-06-16 (×5): 100 mg
  Filled 2020-06-09 (×6): qty 10

## 2020-06-09 MED ORDER — MIDAZOLAM BOLUS VIA INFUSION
1.0000 mg | INTRAVENOUS | Status: DC | PRN
  Administered 2020-06-10 – 2020-06-14 (×4): 2 mg via INTRAVENOUS
  Administered 2020-06-15 (×2): 1 mg via INTRAVENOUS
  Administered 2020-06-16 (×4): 2 mg via INTRAVENOUS
  Administered 2020-06-17: 1 mg via INTRAVENOUS
  Filled 2020-06-09: qty 2

## 2020-06-09 MED ORDER — DEXTROSE 50 % IV SOLN: 0.0000 mL | INTRAVENOUS | Status: DC | PRN

## 2020-06-09 MED ORDER — POLYETHYLENE GLYCOL 3350 17 G PO PACK
17.0000 g | PACK | Freq: Every day | ORAL | Status: DC
  Administered 2020-06-09 – 2020-06-11 (×3): 17 g via ORAL
  Filled 2020-06-09 (×6): qty 1

## 2020-06-09 MED ORDER — DOCUSATE SODIUM 50 MG/5ML PO LIQD
100.0000 mg | Freq: Two times a day (BID) | ORAL | Status: DC
  Administered 2020-06-09: 100 mg via ORAL
  Filled 2020-06-09: qty 10

## 2020-06-09 MED ORDER — MIDAZOLAM 50MG/50ML (1MG/ML) PREMIX INFUSION
0.0000 mg/h | INTRAVENOUS | Status: DC
  Administered 2020-06-09: 5 mg/h via INTRAVENOUS
  Administered 2020-06-10: 8 mg/h via INTRAVENOUS
  Administered 2020-06-10: 4 mg/h via INTRAVENOUS
  Administered 2020-06-11: 8 mg/h via INTRAVENOUS
  Administered 2020-06-11: 5 mg/h via INTRAVENOUS
  Administered 2020-06-12 (×3): 10 mg/h via INTRAVENOUS
  Administered 2020-06-12: 5 mg/h via INTRAVENOUS
  Administered 2020-06-13 – 2020-06-14 (×7): 10 mg/h via INTRAVENOUS
  Administered 2020-06-14: 5 mg/h via INTRAVENOUS
  Administered 2020-06-14: 10 mg/h via INTRAVENOUS
  Administered 2020-06-15 – 2020-06-16 (×3): 4 mg/h via INTRAVENOUS
  Administered 2020-06-17: 8 mg/h via INTRAVENOUS
  Filled 2020-06-09 (×21): qty 50

## 2020-06-09 MED ORDER — INSULIN ASPART 100 UNIT/ML ~~LOC~~ SOLN
0.0000 [IU] | SUBCUTANEOUS | Status: DC
  Administered 2020-06-09: 3 [IU] via SUBCUTANEOUS
  Administered 2020-06-09: 2 [IU] via SUBCUTANEOUS
  Administered 2020-06-09 – 2020-06-10 (×2): 5 [IU] via SUBCUTANEOUS
  Administered 2020-06-10: 3 [IU] via SUBCUTANEOUS
  Administered 2020-06-10: 8 [IU] via SUBCUTANEOUS
  Administered 2020-06-10: 3 [IU] via SUBCUTANEOUS
  Administered 2020-06-10: 2 [IU] via SUBCUTANEOUS
  Administered 2020-06-11: 3 [IU] via SUBCUTANEOUS
  Administered 2020-06-11: 5 [IU] via SUBCUTANEOUS
  Administered 2020-06-11 (×2): 3 [IU] via SUBCUTANEOUS
  Administered 2020-06-11: 5 [IU] via SUBCUTANEOUS
  Administered 2020-06-11: 3 [IU] via SUBCUTANEOUS
  Administered 2020-06-11: 2 [IU] via SUBCUTANEOUS
  Administered 2020-06-12: 8 [IU] via SUBCUTANEOUS
  Administered 2020-06-12: 3 [IU] via SUBCUTANEOUS
  Administered 2020-06-12: 5 [IU] via SUBCUTANEOUS
  Administered 2020-06-13: 2 [IU] via SUBCUTANEOUS
  Administered 2020-06-13 (×2): 3 [IU] via SUBCUTANEOUS
  Administered 2020-06-13: 8 [IU] via SUBCUTANEOUS
  Administered 2020-06-14: 3 [IU] via SUBCUTANEOUS
  Administered 2020-06-14 (×2): 2 [IU] via SUBCUTANEOUS
  Administered 2020-06-14: 3 [IU] via SUBCUTANEOUS
  Administered 2020-06-15: 2 [IU] via SUBCUTANEOUS
  Administered 2020-06-15 (×2): 3 [IU] via SUBCUTANEOUS
  Administered 2020-06-15: 2 [IU] via SUBCUTANEOUS
  Administered 2020-06-15 (×2): 5 [IU] via SUBCUTANEOUS
  Administered 2020-06-16: 11 [IU] via SUBCUTANEOUS
  Administered 2020-06-16 (×4): 5 [IU] via SUBCUTANEOUS
  Administered 2020-06-16: 3 [IU] via SUBCUTANEOUS
  Administered 2020-06-17: 5 [IU] via SUBCUTANEOUS
  Administered 2020-06-17: 3 [IU] via SUBCUTANEOUS
  Administered 2020-06-17: 5 [IU] via SUBCUTANEOUS
  Administered 2020-06-17: 8 [IU] via SUBCUTANEOUS
  Administered 2020-06-17: 5 [IU] via SUBCUTANEOUS
  Administered 2020-06-17: 8 [IU] via SUBCUTANEOUS
  Administered 2020-06-18 (×3): 5 [IU] via SUBCUTANEOUS
  Administered 2020-06-18: 11 [IU] via SUBCUTANEOUS
  Administered 2020-06-18 (×2): 5 [IU] via SUBCUTANEOUS
  Administered 2020-06-18 – 2020-06-19 (×2): 2 [IU] via SUBCUTANEOUS
  Administered 2020-06-19: 5 [IU] via SUBCUTANEOUS
  Administered 2020-06-19 (×3): 3 [IU] via SUBCUTANEOUS
  Administered 2020-06-20: 5 [IU] via SUBCUTANEOUS
  Administered 2020-06-20 (×2): 8 [IU] via SUBCUTANEOUS
  Administered 2020-06-20: 5 [IU] via SUBCUTANEOUS
  Administered 2020-06-20: 11 [IU] via SUBCUTANEOUS
  Administered 2020-06-20 – 2020-06-21 (×2): 8 [IU] via SUBCUTANEOUS
  Administered 2020-06-21: 11 [IU] via SUBCUTANEOUS
  Administered 2020-06-21: 8 [IU] via SUBCUTANEOUS

## 2020-06-09 MED ORDER — PROSOURCE TF PO LIQD
90.0000 mL | Freq: Three times a day (TID) | ORAL | Status: DC
  Administered 2020-06-09 – 2020-06-15 (×17): 90 mL
  Filled 2020-06-09 (×19): qty 90

## 2020-06-09 MED ORDER — FENTANYL BOLUS VIA INFUSION
25.0000 ug | INTRAVENOUS | Status: DC | PRN
  Administered 2020-06-09 – 2020-06-22 (×23): 25 ug via INTRAVENOUS
  Filled 2020-06-09: qty 25

## 2020-06-09 MED ORDER — ADULT MULTIVITAMIN W/MINERALS CH
1.0000 | ORAL_TABLET | Freq: Every day | ORAL | Status: DC
  Administered 2020-06-09: 1 via ORAL
  Filled 2020-06-09: qty 1

## 2020-06-09 MED ORDER — FENTANYL CITRATE (PF) 100 MCG/2ML IJ SOLN
25.0000 ug | Freq: Once | INTRAMUSCULAR | Status: AC
  Administered 2020-06-09: 25 ug via INTRAVENOUS

## 2020-06-09 NOTE — TOC Progression Note (Signed)
Transition of Care [facility]) - Progression Note    Patient Details  Name: Jim Clarke MRN: [id number] Date of Birth: Oct 30, 1941  Transition of Care [facility]) CM/SW Contact  Leeroy Cha, RN Phone Number: 06/09/2020, 9:23 AM  Clinical Narrative:    s-remains on vent and being proned. 70%fi02, has ards, Iv solu-medrol, tube feeds, Iv levophed, iv paralytics. p-following for progression of care may need ltach or snf once stable.   Expected Discharge Plan: Home/Self Care Barriers to Discharge: Continued Medical Work up  Expected Discharge Plan and Services Expected Discharge Plan: Home/Self Care   Discharge Planning Services: CM Consult   Living arrangements for the past 2 months: Single Family Home                                       Social Determinants of Health (SDOH) Interventions    Readmission Risk Interventions No flowsheet data found.

## 2020-06-09 NOTE — Progress Notes (Signed)
Post proning ABG on PRVC 400, 30, peep 10 and 70%  Results for Jim Clarke, Jim Clarke (MRN [id number]) as of 06/09/2020 04:28  Ref. Range 06/09/2020 03:50  FIO2 Unknown 70.00  pH, Arterial Latest Ref Range: 7.35 - 7.45  7.459 (H)  pCO2 arterial Latest Ref Range: 32 - 48 mmHg 57.4 (H)  pO2, Arterial Latest Ref Range: 83 - 108 mmHg 68.8 (L)  Acid-Base Excess Latest Ref Range: 0.0 - 2.0 mmol/L 13.6 (H)  Bicarbonate Latest Ref Range: 20.0 - 28.0 mmol/L 40.1 (H)  O2 Saturation Latest Units: % 93.8  Patient temperature Unknown 99.1

## 2020-06-09 NOTE — Progress Notes (Addendum)
Nutrition Follow-up  DOCUMENTATION CODES:   Non-severe (moderate) malnutrition in context of acute illness/injury  INTERVENTION:  - once TF able to be re-started: Vital 1.5 @ 40 ml/hr with 90 ml Prosource TF TID, 1 tablet multivitamin with minerals/day, and 200 ml free water every 4 hours. - this regimen will provide 1680 kcal (90% estimated kcal need), 131 grams protein, and 1933 ml free water.  NUTRITION DIAGNOSIS:   Moderate Malnutrition related to acute illness, catabolic illness ([id number] infection) as evidenced by mild fat depletion, mild muscle depletion, moderate muscle depletion. -revised  GOAL:   Patient will meet greater than or equal to 90% of their needs -to be met with TF regimen  MONITOR: 125-  Vent status, TF tolerance, Labs, Weight trends, Skin  ASSESSMENT:   79 y.o. male with medical history of type 2 DM on oral hyperglycemic meds, former tobacco abuse, and obesity. He presented to the ED from monoclonal infusion clinic with hypoxia and SOB. He was vaccinated in 03/2020 and has been exposed to unvaccinated wife who is currently recovering from [place]. He called PCP on 7/15 for generalized malaise, fevers, coughing, SOB and was subsequently referred for outpatient monoclonal infusion scheduled for 7/17. While at infusion center, patient noted to have O2 sats into the 70% requiring up 6L on initial presentation.  Significant events: 7/17: admitted for PNA from COVID-19 infection 7/23: intubated early morning d/t ARDS; TF initiation    Patient was proned overnight and had a vomiting episode so TF was held (and remains off at this time) and patient was flipped back to supine. OGT is currently clamped. Able to talk with RN about patient.   NFPE outlined below. Patient meets criteria for malnutrition. Prior to intubation, patient was eating mainly 75-100%. Weight has been stable over the past 1 week.    Patient is currently intubated on ventilator support MV: 12.1  L/min Temp (24hrs), Avg:98.7 F (37.1 C), Min:98.1 F (36.7 C), Max:99.3 F (37.4 C) Propofol: none  Labs reviewed; CBGs: 322 and 287 mg/dl, Cl: 87 mg/dl, BUN: 63 mg/dl, Ca: 8.6 mg/dl. Medications reviewed; 500 mg ascorbic acid/day, 100 mg colace BID, 40 mg IV lasix BID, sliding scale novolog, 35 units lantus/day, 40 mg solu-medrol/day, 17 g miralax/day, 10 g lokelma/day, 220 mg zinc sulfate/day.  IVF; NS @ 75 ml/hr. Drips; fentanyl @ 200 mcg/hr, versed @ 6 mg/hr.     NUTRITION - FOCUSED PHYSICAL EXAM:    Most Recent Value  Orbital Region Mild depletion  Upper Arm Region Mild depletion  Thoracic and Lumbar Region Unable to assess  Buccal Region No depletion  Temple Region Mild depletion  Clavicle Bone Region Mild depletion  Clavicle and Acromion Bone Region Moderate depletion  Scapular Bone Region Unable to assess  Dorsal Hand No depletion  Patellar Region Mild depletion  Anterior Thigh Region No depletion  Posterior Calf Region Mild depletion  Edema (RD Assessment) Mild  [BLE]  Hair Reviewed  Eyes Reviewed  Mouth Unable to assess  Skin Unable to assess  Nails Reviewed       Diet Order:   Diet Order            Diet Carb Modified Fluid consistency: Thin; Room service appropriate? Yes  Diet effective now                 EDUCATION NEEDS:   No education needs have been identified at this time  Skin:  Skin Assessment: Skin Integrity Issues: Skin Integrity Issues:: Unstageable Unstageable: full thickness  to R nare (new doc 7/26)  Last BM:  7/27  Height:   Ht Readings from Last 1 Encounters:  05/21/2020 '5\' 7"'$  (1.702 m)    Weight:   Wt Readings from Last 1 Encounters:  06/09/20 83.5 kg     Estimated Nutritional Needs:  Kcal:  1863 kcal Protein:  125-150 grams (1.5-1.8 grams/kg) Fluid:  >/= 2.1 L/day     Jarome Matin, MS, RD, LDN, CNSC Inpatient Clinical Dietitian RD pager # available in AMION  After hours/weekend pager # available in  [facility]

## 2020-06-09 NOTE — Progress Notes (Signed)
Pt unproned early due to vomiting tube feeds.  Pt tolerated well, RT to monitor and assess as needed.

## 2020-06-09 NOTE — Progress Notes (Signed)
NAME:  Jim Clarke, MRN:  [id number], DOB:  September 16, 1941, LOS: 23 ADMISSION DATE:  05/28/2020, CONSULTATION DATE:  7/23 REFERRING MD:  Bonner Puna, CHIEF COMPLAINT:  Dyspnea   Brief History   79 y/o male admitted with COVID pneumonia on 7/17 after having symptoms for about 10 days.  He was treated with heated high flow, actemra, remdesivir and systemic steroids.  PCCM consulted on 7/23 in the setting of worsening agitated delirium and oxygenation.    Past Medical History  Hyeprtension GERD Hyperlipidemia DM2 Arthritis  Significant Hospital Events   7/17 Admission 7/23 Intubation, paralysed 7/24 Started proning 7/25 Off Levophed 7/26 Off paralytics, proning continued 7/27 vomited while prone  Consults:  PCCM  Procedures:  7/23 ETT >  7/23 Left subclavian central line >  Radial A line 7/24 >>  Significant Diagnostic Tests:    Micro Data:  7/17 SARS COV 2 > positive 7/17 blood culture >   Antimicrobials:  7/17 actemra > 7/18 7/17 remdesivir > 7/21 7/17 methylprednisolone >   Interim history/subjective:    Objective   Blood pressure (!) 103/62, pulse 99, temperature 99.1 F (37.3 C), resp. rate (!) 30, height 5\' 7"  (1.702 m), weight 83.5 kg, SpO2 98 %. CVP:  [1 mmHg-9 mmHg] 2 mmHg  Vent Mode: PRVC FiO2 (%):  [60 %-70 %] 70 % Set Rate:  [30 bmp] 30 bmp Vt Set:  [400 mL] 400 mL PEEP:  [10 cmH20] 10 cmH20 Plateau Pressure:  [17 cmH20-25 cmH20] 23 cmH20   Intake/Output Summary (Last 24 hours) at 06/09/2020 0750 Last data filed at 06/09/2020 0645 Gross per 24 hour  Intake 1552.45 ml  Output 4300 ml  Net -2747.55 ml   Filed Weights   06/07/20 0500 06/08/20 0500 06/09/20 0500  Weight: 82.9 kg 82 kg 83.5 kg    Examination: General:  In bed on vent HENT: NCAT ETT in place PULM: CTA B, vent supported breathing CV: RRR, no mgr GI: BS+, soft, nontender MSK: normal bulk and tone Neuro: sedated on vent   Resolved Hospital Problem list     Assessment & Plan:    ARDS due to COVID 19 pneumonia  Continue mechanical ventilation per ARDS protocol Target TVol 6-8cc/kgIBW Target Plateau Pressure < 30cm H20 Target driving pressure less than 15 cm of water Target PaO2 55-65: titrate PEEP/FiO2 per protocol As long as PaO2 to FiO2 ratio is less than 1:150 position in prone position for 16 hours a day Check CVP daily if CVL in place Target CVP less than 4, diurese as necessary Ventilator associated pneumonia prevention protocol 7/28 plateau pressure and oxygenation acceptable today, plan prone positioning again tonight, VAP prevention, keep off steroids, advance ett 4cm; I'm not sure he is a good tracheostomy candidate  Acute metabolic encephalopathy due to ICU delirium Order PAD protocol RASS target -2 to -3 D/c paralytic protocol Fentanyl/versed infusions per protocol  DM2 with hyperglycemia, complicated by steroids Stop SSI Go back to insulin infusion  Aspiration 7/28? CXR improved, oxygenation OK Monitor clinically, no antibitoics  Hyperkalemia > improved Monitor BMET and UOP Replace electrolytes as needed    Best practice:  Diet: tube feeding Pain/Anxiety/Delirium protocol (if indicated): as above VAP protocol (if indicated): yes DVT prophylaxis: lovenox GI prophylaxis: famoditine Glucose control: as above Mobility: bed rest Code Status: full Family Communication: I called his wife to let him Disposition: remain in ICU  Labs   CBC: Recent Labs  Lab 06/05/20 0730 06/06/20 0401 06/07/20 0500 06/08/20 0320 06/09/20 0434  WBC 10.4 8.0 8.7 11.0* 13.4*  HGB 14.4 13.6 14.3 14.2 14.8  HCT 46.2 43.3 45.3 44.8 46.3  MCV 100.9* 100.9* 100.7* 99.1 98.3  PLT 243 161 170 158 125*    Basic Metabolic Panel: Recent Labs  Lab 06/05/20 0350 06/05/20 0350 06/06/20 0401 06/06/20 0401 06/07/20 0500 06/07/20 1432 06/08/20 0320 06/08/20 1654 06/09/20 0434  NA 141   < > 138   < > 138 138 137 142 140  K 5.2*   < > 6.0*   < > 6.3*  4.5 5.4* 3.9 4.6  CL 103   < > 102   < > 98 91* 89* 93* 87*  CO2 29   < > 27   < > 31 34* 38* 38* 39*  GLUCOSE 382*   < > 376*   < > 366* 271* 278* 93 351*  BUN 50*   < > 40*   < > 40* 46* 54* 58* 63*  CREATININE 1.42*   < > 1.05   < > 1.02 1.12 1.06 0.89 0.92  CALCIUM 8.1*   < > 8.2*   < > 8.1* 8.4* 8.4* 8.2* 8.6*  MG 2.1  --  2.4  --  2.0  --  2.0  --  2.1  PHOS 5.2*  --  3.2  --  3.5  --  3.8  --  4.6   < > = values in this interval not displayed.   GFR: Estimated Creatinine Clearance: 68.4 mL/min (by C-G formula based on SCr of 0.92 mg/dL). Recent Labs  Lab 06/06/20 0401 06/07/20 0500 06/08/20 0320 06/09/20 0434  WBC 8.0 8.7 11.0* 13.4*    Liver Function Tests: Recent Labs  Lab 06/03/20 0231 06/05/20 0350  AST 36 24  ALT 23 22  ALKPHOS 63 66  BILITOT 0.9 0.9  PROT 6.5 5.9*  ALBUMIN 2.8* 2.8*   No results for input(s): LIPASE, AMYLASE in the last 168 hours. No results for input(s): AMMONIA in the last 168 hours.  ABG    Component Value Date/Time   PHART 7.459 (H) 06/09/2020 0350   PCO2ART 57.4 (H) 06/09/2020 0350   PO2ART 68.8 (L) 06/09/2020 0350   HCO3 40.1 (H) 06/09/2020 0350   ACIDBASEDEF 0.4 06/06/2020 1100   O2SAT 93.8 06/09/2020 0350     Coagulation Profile: No results for input(s): INR, PROTIME in the last 168 hours.  Cardiac Enzymes: No results for input(s): CKTOTAL, CKMB, CKMBINDEX, TROPONINI in the last 168 hours.  HbA1C: Hemoglobin A1C  Date/Time Value Ref Range Status  03/31/2020 10:39 AM 7.2 (A) 4.0 - 5.6 % Final  03/04/2018 10:54 AM 7.8  Final   Hgb A1c MFr Bld  Date/Time Value Ref Range Status  05/31/2020 10:49 AM 7.5 (H) 4.8 - 5.6 % Final    Comment:    (NOTE) Pre diabetes:          5.7%-6.4%  Diabetes:              >6.4%  Glycemic control for   <7.0% adults with diabetes     CBG: Recent Labs  Lab 06/07/20 2016 06/07/20 2347 06/08/20 0312 06/08/20 0908 06/08/20 1127  GLUCAP 127* 164* 244* 317* 289*     Critical  care time: 35 minutes     [name], MD [place] [facility] Pager: [phone] Cell: [phone] If no response, call [phone]

## 2020-06-09 NOTE — Telephone Encounter (Signed)
Wife called wanted to set up my chart for husband while he is in ICU.  I explained we could not do that without his approval.  Pt is intubated in ICU.  I explained to wife all communication would come through [facility] if that was set up.  She stated she would rather be able to talk with the nurses/dr.  She states husband is progressing slightly daily.

## 2020-06-09 NOTE — Progress Notes (Signed)
At 0315, patient seen to have vomitted tube feeds and stomach contents while in the proned position. Tube feeds immediately stopped, mouth suctioned and OG put to low intermittent suction. Patient turned back to supine per verbal order from [facility] Patients O2 remained above 92% and Fentanyl and Versed bolus's given as needed. Patient appears comfortable. Awaiting morning chest xray. RN will continue to monitor closely.

## 2020-06-10 ENCOUNTER — Inpatient Hospital Stay (HOSPITAL_COMMUNITY): Payer: Medicare HMO

## 2020-06-10 DIAGNOSIS — K219 Gastro-esophageal reflux disease without esophagitis: Secondary | ICD-10-CM | POA: Diagnosis not present

## 2020-06-10 DIAGNOSIS — J9601 Acute respiratory failure with hypoxia: Secondary | ICD-10-CM | POA: Diagnosis not present

## 2020-06-10 DIAGNOSIS — E1159 Type 2 diabetes mellitus with other circulatory complications: Secondary | ICD-10-CM | POA: Diagnosis not present

## 2020-06-10 DIAGNOSIS — U071 COVID-19: Secondary | ICD-10-CM | POA: Diagnosis not present

## 2020-06-10 LAB — CBC WITH DIFFERENTIAL/PLATELET
Abs Immature Granulocytes: 0.15 10*3/uL — ABNORMAL HIGH (ref 0.00–0.07)
Basophils Absolute: 0 10*3/uL (ref 0.0–0.1)
Basophils Relative: 0 %
Eosinophils Absolute: 0.1 10*3/uL (ref 0.0–0.5)
Eosinophils Relative: 1 %
HCT: 47.1 % (ref 39.0–52.0)
Hemoglobin: 15 g/dL (ref 13.0–17.0)
Immature Granulocytes: 1 %
Lymphocytes Relative: 2 %
Lymphs Abs: 0.4 10*3/uL — ABNORMAL LOW (ref 0.7–4.0)
MCH: 31.3 pg (ref 26.0–34.0)
MCHC: 31.8 g/dL (ref 30.0–36.0)
MCV: 98.3 fL (ref 80.0–100.0)
Monocytes Absolute: 0.7 10*3/uL (ref 0.1–1.0)
Monocytes Relative: 4 %
Neutro Abs: 15.5 10*3/uL — ABNORMAL HIGH (ref 1.7–7.7)
Neutrophils Relative %: 92 %
Platelets: 123 10*3/uL — ABNORMAL LOW (ref 150–400)
RBC: 4.79 MIL/uL (ref 4.22–5.81)
RDW: 14.4 % (ref 11.5–15.5)
WBC: 16.9 10*3/uL — ABNORMAL HIGH (ref 4.0–10.5)
nRBC: 0 % (ref 0.0–0.2)

## 2020-06-10 LAB — COMPREHENSIVE METABOLIC PANEL
ALT: 38 U/L (ref 0–44)
AST: 47 U/L — ABNORMAL HIGH (ref 15–41)
Albumin: 3.1 g/dL — ABNORMAL LOW (ref 3.5–5.0)
Alkaline Phosphatase: 71 U/L (ref 38–126)
Anion gap: 16 — ABNORMAL HIGH (ref 5–15)
BUN: 66 mg/dL — ABNORMAL HIGH (ref 8–23)
CO2: 41 mmol/L — ABNORMAL HIGH (ref 22–32)
Calcium: 9.2 mg/dL (ref 8.9–10.3)
Chloride: 88 mmol/L — ABNORMAL LOW (ref 98–111)
Creatinine, Ser: 0.85 mg/dL (ref 0.61–1.24)
GFR calc Af Amer: >60 mL/min (ref >60)
GFR calc non Af Amer: >60 mL/min (ref >60)
Glucose, Bld: 221 mg/dL — ABNORMAL HIGH (ref 70–99)
Potassium: 4.2 mmol/L (ref 3.5–5.1)
Sodium: 145 mmol/L (ref 135–145)
Total Bilirubin: 1.1 mg/dL (ref 0.3–1.2)
Total Protein: 6.4 g/dL — ABNORMAL LOW (ref 6.5–8.1)

## 2020-06-10 LAB — GLUCOSE, CAPILLARY
Glucose-Capillary: 135 mg/dL — ABNORMAL HIGH (ref 70–99)
Glucose-Capillary: 156 mg/dL — ABNORMAL HIGH (ref 70–99)
Glucose-Capillary: 207 mg/dL — ABNORMAL HIGH (ref 70–99)
Glucose-Capillary: 227 mg/dL — ABNORMAL HIGH (ref 70–99)
Glucose-Capillary: 254 mg/dL — ABNORMAL HIGH (ref 70–99)
Glucose-Capillary: 187 mg/dL — ABNORMAL HIGH (ref 70–99)

## 2020-06-10 LAB — BLOOD GAS, ARTERIAL
Acid-Base Excess: 16.1 mmol/L — ABNORMAL HIGH (ref 0.0–2.0)
Bicarbonate: 42.7 mmol/L — ABNORMAL HIGH (ref 20.0–28.0)
Drawn by: 33147
FIO2: 80
MECHVT: 400 mL
O2 Saturation: 94.5 %
PEEP: 12 cmH2O
Patient temperature: 98.6
RATE: 30 resp/min
pCO2 arterial: 56.8 mmHg — ABNORMAL HIGH (ref 32.0–48.0)
pH, Arterial: 7.489 — ABNORMAL HIGH (ref 7.350–7.450)
pO2, Arterial: 74.8 mmHg — ABNORMAL LOW (ref 83.0–108.0)

## 2020-06-10 MED ORDER — LABETALOL HCL 5 MG/ML IV SOLN
20.0000 mg | INTRAVENOUS | Status: DC | PRN
  Administered 2020-06-12: 20 mg via INTRAVENOUS
  Filled 2020-06-10: qty 4

## 2020-06-10 MED ORDER — FUROSEMIDE 10 MG/ML IJ SOLN
40.0000 mg | Freq: Three times a day (TID) | INTRAMUSCULAR | Status: DC
  Administered 2020-06-10 – 2020-06-11 (×2): 40 mg via INTRAVENOUS
  Filled 2020-06-10 (×2): qty 4

## 2020-06-10 MED ORDER — NOREPINEPHRINE 16 MG/250ML-% IV SOLN
0.0000 ug/min | INTRAVENOUS | Status: DC
  Administered 2020-06-10 (×2): 2 ug/min via INTRAVENOUS
  Administered 2020-06-13: 4 ug/min via INTRAVENOUS
  Filled 2020-06-10 (×2): qty 250

## 2020-06-10 MED ORDER — AMLODIPINE BESYLATE 5 MG PO TABS
5.0000 mg | ORAL_TABLET | Freq: Every day | ORAL | Status: DC
  Administered 2020-06-11: 5 mg via ORAL
  Filled 2020-06-10 (×2): qty 1

## 2020-06-10 NOTE — Progress Notes (Signed)
NAME:  Jim Clarke, MRN:  [id number], DOB:  02-May-1941, LOS: 12 ADMISSION DATE:  05/15/2020, CONSULTATION DATE:  7/23 REFERRING MD:  Bonner Puna, CHIEF COMPLAINT:  Dyspnea   Brief History   79 y/o male admitted with COVID pneumonia on 7/17 after having symptoms for about 10 days.  He was treated with heated high flow, actemra, remdesivir and systemic steroids.  PCCM consulted on 7/23 in the setting of worsening agitated delirium and oxygenation.    Past Medical History  Hyeprtension GERD Hyperlipidemia DM2 Arthritis  Significant Hospital Events   7/17 Admission 7/23 Intubation, paralysed 7/24 Started proning 7/25 Off Levophed 7/26 Off paralytics, proning continued 7/27 vomited while prone  Consults:  PCCM  Procedures:  7/23 ETT >  7/23 Left subclavian central line >  Radial A line 7/24 >   Significant Diagnostic Tests:    Micro Data:  7/17 SARS COV 2 > positive 7/17 blood culture >   Antimicrobials:  7/17 actemra > 7/18 7/17 remdesivir > 7/21 7/17 methylprednisolone >   Interim history/subjective:   Prone again overnight   Objective   Blood pressure (!) 133/54, pulse 94, temperature 99.1 F (37.3 C), resp. rate (!) 30, height 5\' 7"  (1.702 m), weight 80.4 kg, SpO2 90 %. CVP:  [0 mmHg-5 mmHg] 3 mmHg  Vent Mode: PRVC FiO2 (%):  [60 %-80 %] 80 % Set Rate:  [30 bmp] 30 bmp Vt Set:  [400 mL] 400 mL PEEP:  [10 MVE72-09 cmH20] 12 cmH20 Plateau Pressure:  [20 cmH20-29 cmH20] 29 cmH20   Intake/Output Summary (Last 24 hours) at 06/10/2020 0743 Last data filed at 06/10/2020 0600 Gross per 24 hour  Intake 1833.62 ml  Output 2050 ml  Net -216.38 ml   Filed Weights   06/08/20 0500 06/09/20 0500 06/10/20 0404  Weight: 82 kg 83.5 kg 80.4 kg    Examination:  General:  In bed on vent HENT: NCAT ETT in place PULM: CTA B, vent supported breathing CV: RRR, no mgr GI: BS+, soft, nontender MSK: normal bulk and tone Neuro: sedated on vent   Resolved Hospital  Problem list     Assessment & Plan:  ARDS due to COVID 19 pneumonia  Continue mechanical ventilation per ARDS protocol Target TVol 6-8cc/kgIBW Target Plateau Pressure < 30cm H20 Target driving pressure less than 15 cm of water Target PaO2 55-65: titrate PEEP/FiO2 per protocol As long as PaO2 to FiO2 ratio is less than 1:150 position in prone position for 16 hours a day Check CVP daily if CVL in place Target CVP less than 4, diurese as necessary Ventilator associated pneumonia prevention protocol 7/29 plateau pressure 23, driving pressure 10, check supine abg to see if he needs to be prone again, increase lasix frequency, d/c LR  Acute metabolic encephalopathy due to ICU delirium PAD protocol RASS target -2 to -3 Fentanyl/versed infusions per protocol  DM2 with hyperglycemia, complicated by steroids SSI Glargine   Aspiration 7/28? CXR improved, oxygenation OK Continue to monitor clinically, no antibiotics  Hyperkalemia > improved Monitor BMET and UOP Replace electrolytes as needed  Hypertension Add amlodipine Add labetalol prn   Best practice:  Diet: tube feeding Pain/Anxiety/Delirium protocol (if indicated): as above VAP protocol (if indicated): yes DVT prophylaxis: lovenox GI prophylaxis: famoditine Glucose control: as above Mobility: bed rest Code Status: full Family Communication: updated his wife by phone on 7/29 Disposition: remain in ICU  Labs   CBC: Recent Labs  Lab 06/06/20 0401 06/07/20 0500 06/08/20 0320 06/09/20 0434 06/10/20 4709  WBC 8.0 8.7 11.0* 13.4* 16.9*  NEUTROABS  --   --   --   --  15.5*  HGB 13.6 14.3 14.2 14.8 15.0  HCT 43.3 45.3 44.8 46.3 47.1  MCV 100.9* 100.7* 99.1 98.3 98.3  PLT 161 170 158 125* 123*    Basic Metabolic Panel: Recent Labs  Lab 06/05/20 0350 06/05/20 0350 06/06/20 0401 06/06/20 0401 06/07/20 0500 06/07/20 0500 06/07/20 1432 06/08/20 0320 06/08/20 1654 06/09/20 0434 06/10/20 0347  NA 141   < > 138    < > 138   < > 138 137 142 140 145  K 5.2*   < > 6.0*   < > 6.3*   < > 4.5 5.4* 3.9 4.6 4.2  CL 103   < > 102   < > 98   < > 91* 89* 93* 87* 88*  CO2 29   < > 27   < > 31   < > 34* 38* 38* 39* 41*  GLUCOSE 382*   < > 376*   < > 366*   < > 271* 278* 93 351* 221*  BUN 50*   < > 40*   < > 40*   < > 46* 54* 58* 63* 66*  CREATININE 1.42*   < > 1.05   < > 1.02   < > 1.12 1.06 0.89 0.92 0.85  CALCIUM 8.1*   < > 8.2*   < > 8.1*   < > 8.4* 8.4* 8.2* 8.6* 9.2  MG 2.1  --  2.4  --  2.0  --   --  2.0  --  2.1  --   PHOS 5.2*  --  3.2  --  3.5  --   --  3.8  --  4.6  --    < > = values in this interval not displayed.   GFR: Estimated Creatinine Clearance: 72.7 mL/min (by C-G formula based on SCr of 0.85 mg/dL). Recent Labs  Lab 06/07/20 0500 06/08/20 0320 06/09/20 0434 06/10/20 0347  WBC 8.7 11.0* 13.4* 16.9*    Liver Function Tests: Recent Labs  Lab 06/05/20 0350 06/10/20 0347  AST 24 47*  ALT 22 38  ALKPHOS 66 71  BILITOT 0.9 1.1  PROT 5.9* 6.4*  ALBUMIN 2.8* 3.1*   No results for input(s): LIPASE, AMYLASE in the last 168 hours. No results for input(s): AMMONIA in the last 168 hours.  ABG    Component Value Date/Time   PHART 7.441 06/09/2020 1100   PCO2ART 62.5 (H) 06/09/2020 1100   PO2ART 56.8 (L) 06/09/2020 1100   HCO3 41.8 (H) 06/09/2020 1100   ACIDBASEDEF 0.4 06/06/2020 1100   O2SAT 87.9 06/09/2020 1100     Coagulation Profile: No results for input(s): INR, PROTIME in the last 168 hours.  Cardiac Enzymes: No results for input(s): CKTOTAL, CKMB, CKMBINDEX, TROPONINI in the last 168 hours.  HbA1C: Hemoglobin A1C  Date/Time Value Ref Range Status  03/31/2020 10:39 AM 7.2 (A) 4.0 - 5.6 % Final  03/04/2018 10:54 AM 7.8  Final   Hgb A1c MFr Bld  Date/Time Value Ref Range Status  05/31/2020 10:49 AM 7.5 (H) 4.8 - 5.6 % Final    Comment:    (NOTE) Pre diabetes:          5.7%-6.4%  Diabetes:              >6.4%  Glycemic control for   <7.0% adults with  diabetes     CBG: Recent Labs  Lab 06/08/20 2309 06/09/20 0310 06/09/20 0807 06/09/20 1245 06/09/20 1516  GLUCAP 104* 322* 287* 211* 151*     Critical care time: 35 minutes     [name], MD [place] [facility] Pager: [phone] Cell: [phone] If no response, call [phone]

## 2020-06-10 NOTE — Progress Notes (Signed)
Assisted tele visit to patient with family member.  Malvika Tung P, RN  

## 2020-06-11 ENCOUNTER — Inpatient Hospital Stay (HOSPITAL_COMMUNITY): Payer: Medicare HMO

## 2020-06-11 LAB — COMPREHENSIVE METABOLIC PANEL
ALT: 32 U/L (ref 0–44)
AST: 34 U/L (ref 15–41)
Albumin: 2.7 g/dL — ABNORMAL LOW (ref 3.5–5.0)
Alkaline Phosphatase: 67 U/L (ref 38–126)
Anion gap: 12 (ref 5–15)
BUN: 61 mg/dL — ABNORMAL HIGH (ref 8–23)
CO2: 39 mmol/L — ABNORMAL HIGH (ref 22–32)
Calcium: 8.5 mg/dL — ABNORMAL LOW (ref 8.9–10.3)
Chloride: 93 mmol/L — ABNORMAL LOW (ref 98–111)
Creatinine, Ser: 0.76 mg/dL (ref 0.61–1.24)
GFR calc Af Amer: >60 mL/min (ref >60)
GFR calc non Af Amer: >60 mL/min (ref >60)
Glucose, Bld: 139 mg/dL — ABNORMAL HIGH (ref 70–99)
Potassium: 3.6 mmol/L (ref 3.5–5.1)
Sodium: 144 mmol/L (ref 135–145)
Total Bilirubin: 0.9 mg/dL (ref 0.3–1.2)
Total Protein: 5.7 g/dL — ABNORMAL LOW (ref 6.5–8.1)

## 2020-06-11 LAB — GLUCOSE, CAPILLARY
Glucose-Capillary: 141 mg/dL — ABNORMAL HIGH (ref 70–99)
Glucose-Capillary: 149 mg/dL — ABNORMAL HIGH (ref 70–99)
Glucose-Capillary: 190 mg/dL — ABNORMAL HIGH (ref 70–99)
Glucose-Capillary: 190 mg/dL — ABNORMAL HIGH (ref 70–99)
Glucose-Capillary: 191 mg/dL — ABNORMAL HIGH (ref 70–99)

## 2020-06-11 LAB — CBC WITH DIFFERENTIAL/PLATELET
Abs Immature Granulocytes: 0.11 10*3/uL — ABNORMAL HIGH (ref 0.00–0.07)
Basophils Absolute: 0 10*3/uL (ref 0.0–0.1)
Basophils Relative: 0 %
Eosinophils Absolute: 0.2 10*3/uL (ref 0.0–0.5)
Eosinophils Relative: 2 %
HCT: 44.7 % (ref 39.0–52.0)
Hemoglobin: 14.3 g/dL (ref 13.0–17.0)
Immature Granulocytes: 1 %
Lymphocytes Relative: 5 %
Lymphs Abs: 0.6 10*3/uL — ABNORMAL LOW (ref 0.7–4.0)
MCH: 31.8 pg (ref 26.0–34.0)
MCHC: 32 g/dL (ref 30.0–36.0)
MCV: 99.3 fL (ref 80.0–100.0)
Monocytes Absolute: 0.6 10*3/uL (ref 0.1–1.0)
Monocytes Relative: 5 %
Neutro Abs: 11.9 10*3/uL — ABNORMAL HIGH (ref 1.7–7.7)
Neutrophils Relative %: 87 %
Platelets: 125 10*3/uL — ABNORMAL LOW (ref 150–400)
RBC: 4.5 MIL/uL (ref 4.22–5.81)
RDW: 14.2 % (ref 11.5–15.5)
WBC: 13.5 10*3/uL — ABNORMAL HIGH (ref 4.0–10.5)
nRBC: 0 % (ref 0.0–0.2)

## 2020-06-11 LAB — BLOOD GAS, ARTERIAL
Acid-Base Excess: 12.5 mmol/L — ABNORMAL HIGH (ref 0.0–2.0)
Bicarbonate: 38.7 mmol/L — ABNORMAL HIGH (ref 20.0–28.0)
FIO2: 90
O2 Saturation: 98 %
Patient temperature: 98.6
pCO2 arterial: 55.9 mmHg — ABNORMAL HIGH (ref 32.0–48.0)
pH, Arterial: 7.454 — ABNORMAL HIGH (ref 7.350–7.450)
pO2, Arterial: 97.5 mmHg (ref 83.0–108.0)

## 2020-06-11 MED ORDER — POTASSIUM CHLORIDE 20 MEQ/15ML (10%) PO SOLN
40.0000 meq | Freq: Once | ORAL | Status: AC
  Administered 2020-06-11: 40 meq
  Filled 2020-06-11: qty 30

## 2020-06-11 MED ORDER — VECURONIUM BROMIDE 10 MG IV SOLR
10.0000 mg | Freq: Four times a day (QID) | INTRAVENOUS | Status: DC | PRN
  Administered 2020-06-12 – 2020-06-14 (×3): 10 mg via INTRAVENOUS
  Filled 2020-06-11 (×4): qty 10

## 2020-06-11 MED ORDER — FUROSEMIDE 10 MG/ML IJ SOLN
40.0000 mg | Freq: Two times a day (BID) | INTRAMUSCULAR | Status: DC
  Administered 2020-06-11 – 2020-06-14 (×7): 40 mg via INTRAVENOUS
  Filled 2020-06-11 (×7): qty 4

## 2020-06-11 MED ORDER — DEXTROSE 10 % IV SOLN: INTRAVENOUS | Status: DC

## 2020-06-11 NOTE — Progress Notes (Signed)
NAME:  DEARIES MEIKLE, MRN:  [id number], DOB:  04-22-1941, LOS: 2 ADMISSION DATE:  05/27/2020, CONSULTATION DATE:  7/23 REFERRING MD:  Bonner Puna, CHIEF COMPLAINT:  Dyspnea   Brief History   79 y/o male admitted with COVID pneumonia on 7/17 after having symptoms for about 10 days.  He was treated with heated high flow, actemra, remdesivir and systemic steroids.  PCCM consulted on 7/23 in the setting of worsening agitated delirium and oxygenation.    Past Medical History  Hyeprtension GERD Hyperlipidemia DM2 Arthritis  Significant Hospital Events   7/17 Admission 7/23 Intubation, paralysed 7/24 Started proning 7/25 Off Levophed 7/26 Off paralytics, proning continued 7/27 vomited while prone  Consults:  PCCM  Procedures:  7/23 ETT >  7/23 Left subclavian central line >  Radial A line 7/24 >   Significant Diagnostic Tests:    Micro Data:  7/17 SARS COV 2 > positive 7/17 blood culture > ng  Antimicrobials:  7/17 actemra > 7/18 7/17 remdesivir > 7/21 7/17 methylprednisolone >   Interim history/subjective:   Critically ill, intubated Sedated on Versed/fentanyl On low-dose Levophed 2 mics Not paralyzed  Good urine output with Lasix Afebrile  Objective   Blood pressure 119/72, pulse 104, temperature 99.3 F (37.4 C), resp. rate (!) 31, height 5\' 7"  (1.702 m), weight 82.6 kg, SpO2 93 %. CVP:  [2 mmHg-12 mmHg] 7 mmHg  Vent Mode: PRVC FiO2 (%):  [80 %-100 %] 90 % Set Rate:  [30 bmp] 30 bmp Vt Set:  [40 mL-400 mL] 400 mL PEEP:  [12 cmH20] 12 cmH20 Plateau Pressure:  [25 cmH20-29 cmH20] 29 cmH20   Intake/Output Summary (Last 24 hours) at 06/11/2020 1119 Last data filed at 06/11/2020 0800 Gross per 24 hour  Intake 1452.07 ml  Output 2500 ml  Net -1047.93 ml   Filed Weights   06/09/20 0500 06/10/20 0404 06/11/20 0340  Weight: 83.5 kg 80.4 kg 82.6 kg    Examination:  General:  In bed on vent , elderly man, prone position HENT: NCAT ETT in place PULM: CTA B,  vent supported breathing , peak pressure 29 , on 90%/PEEP of 12 CV: RRR, no mgr GI: BS+, soft, nontender MSK: normal bulk and tone Neuro: sedated on vent , RASS -5.  Chest x-ray personally reviewed which shows left more than right extensive airspace disease. Labs show mild hypokalemia, improving leukocytosis   Resolved Hospital Problem list     Assessment & Plan:  ARDS due to COVID 19 pneumonia  Superimposed aspiration pneumonia  Continue mechanical ventilation per ARDS protocol Target TVol 6-8cc/kgIBW At Target Plateau Pressure < 30cm H20 Target driving pressure less than 15 cm of water Target PaO2 55-65: titrate PEEP/FiO2 per protocol As long as PaO2 to FiO2 ratio is less than 1:150 position ct prone position for 16 hours a day Check CVP daily if CVL in place Target CVP less than 4, currently at 8, continue Lasix 40 every 8 Ventilator associated pneumonia prevention protocol 7/30 plateau pressure 22, check supine ABG after 6 hours Increase PEEP to 14 since continues to require high FiO2  Acute metabolic encephalopathy due to ICU delirium PAD protocol RASS target - 4 to -5 Fentanyl/versed infusions per protocol  DM2 with hyperglycemia, complicated by steroids SSI Glargine 35 u q hs  Aspiration 7/28? CXR shows extensive left-sided airspace disease Check respiratory culture with low threshold to add antibiotics   Hypertension dc amlodipine Add labetalol prn   Best practice:  Diet: tube feeding Pain/Anxiety/Delirium protocol (if  indicated): as above VAP protocol (if indicated): yes DVT prophylaxis: lovenox GI prophylaxis: famoditine Glucose control: as above Mobility: bed rest Code Status: full Family Communication:  wife by phone on 7/30 Disposition: remain in ICU  Labs   CBC: Recent Labs  Lab 06/07/20 0500 06/08/20 0320 06/09/20 0434 06/10/20 0347 06/11/20 0337  WBC 8.7 11.0* 13.4* 16.9* 13.5*  NEUTROABS  --   --   --  15.5* 11.9*  HGB 14.3 14.2  14.8 15.0 14.3  HCT 45.3 44.8 46.3 47.1 44.7  MCV 100.7* 99.1 98.3 98.3 99.3  PLT 170 158 125* 123* 125*    Basic Metabolic Panel: Recent Labs  Lab 06/05/20 0350 06/05/20 0350 06/06/20 0401 06/06/20 0401 06/07/20 0500 06/07/20 1432 06/08/20 0320 06/08/20 1654 06/09/20 0434 06/10/20 0347 06/11/20 0337  NA 141   < > 138   < > 138   < > 137 142 140 145 144  K 5.2*   < > 6.0*   < > 6.3*   < > 5.4* 3.9 4.6 4.2 3.6  CL 103   < > 102   < > 98   < > 89* 93* 87* 88* 93*  CO2 29   < > 27   < > 31   < > 38* 38* 39* 41* 39*  GLUCOSE 382*   < > 376*   < > 366*   < > 278* 93 351* 221* 139*  BUN 50*   < > 40*   < > 40*   < > 54* 58* 63* 66* 61*  CREATININE 1.42*   < > 1.05   < > 1.02   < > 1.06 0.89 0.92 0.85 0.76  CALCIUM 8.1*   < > 8.2*   < > 8.1*   < > 8.4* 8.2* 8.6* 9.2 8.5*  MG 2.1  --  2.4  --  2.0  --  2.0  --  2.1  --   --   PHOS 5.2*  --  3.2  --  3.5  --  3.8  --  4.6  --   --    < > = values in this interval not displayed.   GFR: Estimated Creatinine Clearance: 78.3 mL/min (by C-G formula based on SCr of 0.76 mg/dL). Recent Labs  Lab 06/08/20 0320 06/09/20 0434 06/10/20 0347 06/11/20 0337  WBC 11.0* 13.4* 16.9* 13.5*    Liver Function Tests: Recent Labs  Lab 06/05/20 0350 06/10/20 0347 06/11/20 0337  AST 24 47* 34  ALT 22 38 32  ALKPHOS 66 71 67  BILITOT 0.9 1.1 0.9  PROT 5.9* 6.4* 5.7*  ALBUMIN 2.8* 3.1* 2.7*   No results for input(s): LIPASE, AMYLASE in the last 168 hours. No results for input(s): AMMONIA in the last 168 hours.  ABG    Component Value Date/Time   PHART 7.489 (H) 06/10/2020 1230   PCO2ART 56.8 (H) 06/10/2020 1230   PO2ART 74.8 (L) 06/10/2020 1230   HCO3 42.7 (H) 06/10/2020 1230   ACIDBASEDEF 0.4 06/06/2020 1100   O2SAT 94.5 06/10/2020 1230     Coagulation Profile: No results for input(s): INR, PROTIME in the last 168 hours.  Cardiac Enzymes: No results for input(s): CKTOTAL, CKMB, CKMBINDEX, TROPONINI in the last 168  hours.  HbA1C: Hemoglobin A1C  Date/Time Value Ref Range Status  03/31/2020 10:39 AM 7.2 (A) 4.0 - 5.6 % Final  03/04/2018 10:54 AM 7.8  Final   Hgb A1c MFr Bld  Date/Time Value Ref Range Status  05/31/2020 10:49 AM 7.5 (H) 4.8 - 5.6 % Final    Comment:    (NOTE) Pre diabetes:          5.7%-6.4%  Diabetes:              >6.4%  Glycemic control for   <7.0% adults with diabetes     CBG: Recent Labs  Lab 06/10/20 1628 06/10/20 2013 06/10/20 2356 06/11/20 0324 06/11/20 0845  GLUCAP 187* 141* 190* 149* 190*     Critical care time: 35 minutes    Kara Mead MD. FCCP. [facility]  If no response to pager , please call [phone]   06/11/2020

## 2020-06-11 NOTE — Progress Notes (Signed)
   06/11/20 1300  Clinical Encounter Type  Visited With Family (Daughter )  Visit Type Initial;Spiritual support;Social support  Referral From Family  Consult/Referral To Chaplain  Spiritual Encounters  Spiritual Needs Emotional;Other (Comment) (Spiritual Conversation/Support)  Stress Factors  Patient Stress Factors Not reviewed  Family Stress Factors Health changes;Lack of knowledge;Major life changes   I spoke with Naythen' daughter per a voicemail that requesting help with an Advance Directive.  I provided spiritual support.   Please, contact Spiritual Care for further assistance.   Chaplain Shanon Ace M.Div., [facility]

## 2020-06-11 NOTE — Progress Notes (Signed)
[facility] Progress Note Patient Name: Jim Clarke DOB: Mar 27, 1941 MRN: [id number]   Date of Service  06/11/2020  HPI/Events of Note  Patient proned and now has increased tube feed residuals. Patient is on Lantus + Novolog SSI.  eICU Interventions  Plan: 1. Hold tube feeds while proned.  2. Gastric tube to LIS.  3. D10W to run IV at 30 mL/hour.      Intervention Category Major Interventions: Other:  [name] 06/11/2020, 11:43 PM

## 2020-06-12 ENCOUNTER — Inpatient Hospital Stay (HOSPITAL_COMMUNITY): Payer: Medicare HMO

## 2020-06-12 DIAGNOSIS — J80 Acute respiratory distress syndrome: Secondary | ICD-10-CM

## 2020-06-12 LAB — CBC
HCT: 45.8 % (ref 39.0–52.0)
Hemoglobin: 14.2 g/dL (ref 13.0–17.0)
MCH: 31.7 pg (ref 26.0–34.0)
MCHC: 31 g/dL (ref 30.0–36.0)
MCV: 102.2 fL — ABNORMAL HIGH (ref 80.0–100.0)
Platelets: 129 10*3/uL — ABNORMAL LOW (ref 150–400)
RBC: 4.48 MIL/uL (ref 4.22–5.81)
RDW: 14.1 % (ref 11.5–15.5)
WBC: 13.1 10*3/uL — ABNORMAL HIGH (ref 4.0–10.5)
nRBC: 0 % (ref 0.0–0.2)

## 2020-06-12 LAB — BLOOD GAS, ARTERIAL
Acid-Base Excess: 15.5 mmol/L — ABNORMAL HIGH (ref 0.0–2.0)
Bicarbonate: 43.8 mmol/L — ABNORMAL HIGH (ref 20.0–28.0)
FIO2: 80
MECHVT: 400 mL
O2 Saturation: 98.4 %
PEEP: 14 cmH2O
Patient temperature: 98.6
RATE: 30 resp/min
pCO2 arterial: 70.1 mmHg (ref 32.0–48.0)
pH, Arterial: 7.413 (ref 7.350–7.450)
pO2, Arterial: 96.7 mmHg (ref 83.0–108.0)

## 2020-06-12 LAB — BASIC METABOLIC PANEL
Anion gap: 11 (ref 5–15)
BUN: 67 mg/dL — ABNORMAL HIGH (ref 8–23)
CO2: 40 mmol/L — ABNORMAL HIGH (ref 22–32)
Calcium: 8.7 mg/dL — ABNORMAL LOW (ref 8.9–10.3)
Chloride: 96 mmol/L — ABNORMAL LOW (ref 98–111)
Creatinine, Ser: 0.71 mg/dL (ref 0.61–1.24)
GFR calc Af Amer: >60 mL/min (ref >60)
GFR calc non Af Amer: >60 mL/min (ref >60)
Glucose, Bld: 107 mg/dL — ABNORMAL HIGH (ref 70–99)
Potassium: 3.6 mmol/L (ref 3.5–5.1)
Sodium: 147 mmol/L — ABNORMAL HIGH (ref 135–145)

## 2020-06-12 LAB — MAGNESIUM: Magnesium: 2.3 mg/dL (ref 1.7–2.4)

## 2020-06-12 LAB — PHOSPHORUS: Phosphorus: 4 mg/dL (ref 2.5–4.6)

## 2020-06-12 MED ORDER — DEXTROSE 50 % IV SOLN
INTRAVENOUS | Status: AC
  Administered 2020-06-12: 50 mL
  Filled 2020-06-12: qty 50

## 2020-06-12 MED ORDER — STERILE WATER FOR INJECTION IJ SOLN
INTRAMUSCULAR | Status: AC
  Administered 2020-06-12: 10 mL
  Filled 2020-06-12: qty 10

## 2020-06-12 MED ORDER — SODIUM CHLORIDE 0.9 % IV SOLN
3.0000 g | Freq: Four times a day (QID) | INTRAVENOUS | Status: DC
  Administered 2020-06-12 – 2020-06-13 (×4): 3 g via INTRAVENOUS
  Filled 2020-06-12: qty 3
  Filled 2020-06-12: qty 8
  Filled 2020-06-12 (×3): qty 3

## 2020-06-12 NOTE — Progress Notes (Signed)
Proned patient with RT and four RNs at approximately 2230. Patient's oxygen saturation dropped to mid 80's for short period, then recovered back to low 90's. One bolus of Versed 2mg  given just prior to pronation to ensure comfort and maintenance of RASS -4. No other PRN meds required at that time. (Also confirmed with E-link that BIS monitoring and paralyzation not necessary for pronation therapy at this time. Still have orders for Vecuronium prn only if asynchronous or sustained dropping of oxygen saturation, but patient has not required this thus far.) Prior to resuming tube feeds, RN aspirated to check residuals. None found at 2300. Within thirty minutes and after kangaroo pump alarmed "obstruction" multiple times, RN checked residual again and found at least 70 mL of stomach contents appearing like tube feed in syringe with more in OG tube. Immediately notified E-link and given orders to switch to intermittent low wall suction and hang D-10 IVF.  Notified and updated patient's daughter, Abigail Butts, as she had called earlier in the shift and I was unable to speak at that time. Abigail Butts verbalized understanding and appreciation for care. She requested that MD's call tomorrow morning around 0930 if possible. Requested charted on physician sticky notes on Summary page in epic. Continuing to monitor patient closely and provide appropriate care.

## 2020-06-12 NOTE — Progress Notes (Addendum)
CRITICAL VALUE ALERT  Critical Value:  Results for MADISON, ALBEA (MRN [id number]) as of 06/12/2020 22:14  Ref. Range 06/12/2020 21:40  Delivery systems Unknown VENTILATOR  FIO2 Unknown 80.00  Mode Unknown PRESSURE REGULATED VOLUME CONTROL  VT Latest Units: mL 400  Peep/cpap Latest Units: cm H20 14.0  pH, Arterial Latest Ref Range: 7.35 - 7.45  7.413  pCO2 arterial Latest Ref Range: 32 - 48 mmHg 70.1 (HH)  pO2, Arterial Latest Ref Range: 83 - 108 mmHg 96.7  Acid-Base Excess Latest Ref Range: 0.0 - 2.0 mmol/L 15.5 (H)  Bicarbonate Latest Ref Range: 20.0 - 28.0 mmol/L 43.8 (H)  O2 Saturation Latest Units: % 98.4  Patient temperature Unknown 98.6    Date & Time Notied:  06-12-20 2214 to RN; 06-12-20 @ 2300 to MD  Provider Notified: E-Link RN Peggye Fothergill; MD Dr. Oletta Darter   Orders Received/Actions taken: orders given to prone patient tonight.

## 2020-06-12 NOTE — Progress Notes (Signed)
Pharmacy Antibiotic Note  Jim Clarke is a 79 y.o. male admitted on 05/23/2020 with COVID PNA. Patient is intubated. Pharmacy has been consulted for Unasyn dosing for aspiration PNA. GNR in trach aspirate.   Plan: Unasyn 3 g iv q 6 hours  Narrow upon culture results  Will sign off and follow remotely  Thank you for the consult  Height: 5\' 7"  (170.2 cm) Weight: 86.5 kg (190 lb 11.2 oz) IBW/kg (Calculated) : 66.1  Temp (24hrs), Avg:98.3 F (36.8 C), Min:97.7 F (36.5 C), Max:99 F (37.2 C)  Recent Labs  Lab 06/08/20 0320 06/08/20 0320 06/08/20 1654 06/09/20 0434 06/10/20 0347 06/11/20 0337 06/12/20 0500  WBC 11.0*  --   --  13.4* 16.9* 13.5* 13.1*  CREATININE 1.06   < > 0.89 0.92 0.85 0.76 0.71   < > = values in this interval not displayed.    Estimated Creatinine Clearance: 80 mL/min (by C-G formula based on SCr of 0.71 mg/dL).    Allergies  Allergen Reactions  . Sulfa Antibiotics      Thank you for allowing pharmacy to be a part of this patient's care.  [name] 06/12/2020 12:07 PM

## 2020-06-12 NOTE — Progress Notes (Addendum)
NAME:  Jim Clarke, MRN:  [id number], DOB:  [birth year], LOS: 41 ADMISSION DATE:  05/28/2020, CONSULTATION DATE:  7/23 REFERRING MD:  Bonner Puna, CHIEF COMPLAINT:  Dyspnea   Brief History   79 y/o male admitted with COVID pneumonia on 7/17 after having symptoms for about 10 days.  He was treated with heated high flow, actemra, remdesivir and systemic steroids.  PCCM consulted on 7/23 in the setting of worsening agitated delirium and oxygenation.    Past Medical History  Hyeprtension GERD Hyperlipidemia DM2 Arthritis  Significant Hospital Events   7/17 Admission 7/23 Intubation, paralysed 7/24 Started proning 7/25 Off Levophed 7/26 Off paralytics, proning continued 7/27 vomited while prone  Consults:  PCCM  Procedures:  7/23 ETT >  7/23 Left subclavian central line >  Radial A line 7/24 >   Significant Diagnostic Tests:    Micro Data:  7/17 SARS COV 2 > positive 7/17 blood culture > ng 7/30 resp >> GNR >>  Antimicrobials:  7/17 actemra > 7/18 7/17 remdesivir > 7/21 7/17 methylprednisolone >   Interim history/subjective:   Critically ill, intubated Sedated on Versed/fentanyl Off Levophed Vent asynchrony this morning, requiring increased sedation and 1 dose of paralytic  Objective   Blood pressure (!) 158/92, pulse (!) 110, temperature 98.1 F (36.7 C), resp. rate (!) 25, height 5\' 7"  (1.702 m), weight 86.5 kg, SpO2 90 %. CVP:  [3 mmHg-12 mmHg] 11 mmHg  Vent Mode: PRVC FiO2 (%):  [80 %-90 %] 80 % Set Rate:  [30 bmp] 30 bmp Vt Set:  [400 mL] 400 mL PEEP:  [14 cmH20] 14 cmH20 Plateau Pressure:  [27 cmH20-47 cmH20] 27 cmH20   Intake/Output Summary (Last 24 hours) at 06/12/2020 1152 Last data filed at 06/12/2020 1100 Gross per 24 hour  Intake 1755.67 ml  Output 3320 ml  Net -1564.33 ml   Filed Weights   06/10/20 0404 06/11/20 0340 06/12/20 0420  Weight: 80.4 kg 82.6 kg 86.5 kg    Examination:  General:  In bed on vent , elderly man, prone  position HENT: NCAT ETT in place PULM: CTA B, vent supported breathing , plateau 25, on 90%/PEEP of 14, some vent asynchrony CV: RRR, no mgr GI: BS+, soft, nontender MSK: normal bulk and tone Neuro: sedated on vent , RASS -5.  Chest x-ray personally reviewed which shows left more than right extensive airspace disease, unchanged from 7/30 Labs show mild hypokalemia, hypernatremia, stable leukocytosis   Resolved Hospital Problem list     Assessment & Plan:  ARDS due to COVID 19 pneumonia  Superimposed aspiration pneumonia, GNR  Continue mechanical ventilation per ARDS protocol Target TVol 6-8cc/kgIBW At Target Plateau Pressure < 30cm H20 At Target driving pressure less than 15 cm of water Target PaO2 55-65: titrate PEEP/FiO2 per protocol As long as PaO2 to FiO2 ratio is less than 1:150 position ct prone position for 16 hours a day Check CVP daily if CVL in place Target CVP less than 4, currently at 8,  Lasix 40 every 12 Ventilator associated pneumonia prevention protocol 7/31 maintain PEEP of 14, may need paralytic for vent synchrony,  Acute metabolic encephalopathy due to ICU delirium PAD protocol RASS target - 4 to -5 , deep sedation since receiving as needed paralytic Fentanyl/versed infusions per protocol  DM2 with hyperglycemia, complicated by steroids SSI Glargine 35 u q hs linagliptin  Aspiration 7/28? CXR shows extensive left-sided airspace disease -Add Unasyn while waiting on GNR and respiratory culture to speciate -Residuals are low this  morning, tube feeds resumed  Hypernatremia -Added free water   Best practice:  Diet: tube feeding Pain/Anxiety/Delirium protocol (if indicated): as above VAP protocol (if indicated): yes DVT prophylaxis: lovenox GI prophylaxis: famoditine Glucose control: as above Mobility: bed rest Code Status: full Family Communication:  Wife & daughter by phone on 7/31 Disposition: remain in ICU  Labs   CBC: Recent Labs  Lab  06/08/20 0320 06/09/20 0434 06/10/20 0347 06/11/20 0337 06/12/20 0500  WBC 11.0* 13.4* 16.9* 13.5* 13.1*  NEUTROABS  --   --  15.5* 11.9*  --   HGB 14.2 14.8 15.0 14.3 14.2  HCT 44.8 46.3 47.1 44.7 45.8  MCV 99.1 98.3 98.3 99.3 102.2*  PLT 158 125* 123* 125* 129*    Basic Metabolic Panel: Recent Labs  Lab 06/06/20 0401 06/06/20 0401 06/07/20 0500 06/07/20 1432 06/08/20 0320 06/08/20 0320 06/08/20 1654 06/09/20 0434 06/10/20 0347 06/11/20 0337 06/12/20 0500  NA 138   < > 138   < > 137   < > 142 140 145 144 147*  K 6.0*   < > 6.3*   < > 5.4*   < > 3.9 4.6 4.2 3.6 3.6  CL 102   < > 98   < > 89*   < > 93* 87* 88* 93* 96*  CO2 27   < > 31   < > 38*   < > 38* 39* 41* 39* 40*  GLUCOSE 376*   < > 366*   < > 278*   < > 93 351* 221* 139* 107*  BUN 40*   < > 40*   < > 54*   < > 58* 63* 66* 61* 67*  CREATININE 1.05   < > 1.02   < > 1.06   < > 0.89 0.92 0.85 0.76 0.71  CALCIUM 8.2*   < > 8.1*   < > 8.4*   < > 8.2* 8.6* 9.2 8.5* 8.7*  MG 2.4  --  2.0  --  2.0  --   --  2.1  --   --  2.3  PHOS 3.2  --  3.5  --  3.8  --   --  4.6  --   --  4.0   < > = values in this interval not displayed.   GFR: Estimated Creatinine Clearance: 80 mL/min (by C-G formula based on SCr of 0.71 mg/dL). Recent Labs  Lab 06/09/20 0434 06/10/20 0347 06/11/20 0337 06/12/20 0500  WBC 13.4* 16.9* 13.5* 13.1*    Liver Function Tests: Recent Labs  Lab 06/10/20 0347 06/11/20 0337  AST 47* 34  ALT 38 32  ALKPHOS 71 67  BILITOT 1.1 0.9  PROT 6.4* 5.7*  ALBUMIN 3.1* 2.7*   No results for input(s): LIPASE, AMYLASE in the last 168 hours. No results for input(s): AMMONIA in the last 168 hours.  ABG    Component Value Date/Time   PHART 7.454 (H) 06/11/2020 1600   PCO2ART 55.9 (H) 06/11/2020 1600   PO2ART 97.5 06/11/2020 1600   HCO3 38.7 (H) 06/11/2020 1600   ACIDBASEDEF 0.4 06/06/2020 1100   O2SAT 98.0 06/11/2020 1600     Coagulation Profile: No results for input(s): INR, PROTIME in the last  168 hours.  Cardiac Enzymes: No results for input(s): CKTOTAL, CKMB, CKMBINDEX, TROPONINI in the last 168 hours.  HbA1C: Hemoglobin A1C  Date/Time Value Ref Range Status  03/31/2020 10:39 AM 7.2 (A) 4.0 - 5.6 % Final  03/04/2018 10:54 AM 7.8  Final  Hgb A1c MFr Bld  Date/Time Value Ref Range Status  05/31/2020 10:49 AM 7.5 (H) 4.8 - 5.6 % Final    Comment:    (NOTE) Pre diabetes:          5.7%-6.4%  Diabetes:              >6.4%  Glycemic control for   <7.0% adults with diabetes     CBG: Recent Labs  Lab 06/10/20 2013 06/10/20 2356 06/11/20 0324 06/11/20 0845 06/11/20 1139  GLUCAP 141* 190* 149* 190* 191*     Critical care time: 35 minutes    Kara Mead MD. FCCP. [facility]  If no response to pager , please call [phone]   06/12/2020

## 2020-06-12 NOTE — Progress Notes (Addendum)
[facility] Progress Note Patient Name: Jim Clarke DOB: 1940-12-12 MRN: [id number]   Date of Service  06/12/2020  HPI/Events of Note  ABG on 80%/PRVC 30 /TV 400/P 14 = 7.412/70.1/96.7. Patient already proned. P/F ratio = 120.   eICU Interventions  P/F ratio < 150, therefore, will continue to prone.      Intervention Category Major Interventions: Respiratory failure - evaluation and management  Dameka Younker Eugene 06/12/2020, 10:56 PM

## 2020-06-13 LAB — CBC
HCT: 43.9 % (ref 39.0–52.0)
Hemoglobin: 13.7 g/dL (ref 13.0–17.0)
MCH: 31.8 pg (ref 26.0–34.0)
MCHC: 31.2 g/dL (ref 30.0–36.0)
MCV: 101.9 fL — ABNORMAL HIGH (ref 80.0–100.0)
Platelets: 139 10*3/uL — ABNORMAL LOW (ref 150–400)
RBC: 4.31 MIL/uL (ref 4.22–5.81)
RDW: 14.1 % (ref 11.5–15.5)
WBC: 9.9 10*3/uL (ref 4.0–10.5)
nRBC: 0 % (ref 0.0–0.2)

## 2020-06-13 LAB — CULTURE, RESPIRATORY W GRAM STAIN

## 2020-06-13 LAB — BLOOD GAS, ARTERIAL
Acid-Base Excess: 15.2 mmol/L — ABNORMAL HIGH (ref 0.0–2.0)
Bicarbonate: 43.9 mmol/L — ABNORMAL HIGH (ref 20.0–28.0)
FIO2: 60
MECHVT: 400 mL
O2 Saturation: 93.8 %
PEEP: 14 cmH2O
Patient temperature: 98.2
RATE: 30 resp/min
pCO2 arterial: 67.1 mmHg (ref 32.0–48.0)
pH, Arterial: 7.43 (ref 7.350–7.450)
pO2, Arterial: 72.4 mmHg — ABNORMAL LOW (ref 83.0–108.0)

## 2020-06-13 LAB — BASIC METABOLIC PANEL
Anion gap: 10 (ref 5–15)
BUN: 68 mg/dL — ABNORMAL HIGH (ref 8–23)
CO2: 43 mmol/L — ABNORMAL HIGH (ref 22–32)
Calcium: 8.7 mg/dL — ABNORMAL LOW (ref 8.9–10.3)
Chloride: 98 mmol/L (ref 98–111)
Creatinine, Ser: 0.9 mg/dL (ref 0.61–1.24)
GFR calc Af Amer: >60 mL/min (ref >60)
GFR calc non Af Amer: >60 mL/min (ref >60)
Glucose, Bld: 57 mg/dL — ABNORMAL LOW (ref 70–99)
Potassium: 3.3 mmol/L — ABNORMAL LOW (ref 3.5–5.1)
Sodium: 151 mmol/L — ABNORMAL HIGH (ref 135–145)

## 2020-06-13 LAB — MAGNESIUM: Magnesium: 2.1 mg/dL (ref 1.7–2.4)

## 2020-06-13 LAB — PHOSPHORUS: Phosphorus: 4.2 mg/dL (ref 2.5–4.6)

## 2020-06-13 MED ORDER — FREE WATER
300.0000 mL | Status: DC
  Administered 2020-06-13 – 2020-06-14 (×5): 300 mL

## 2020-06-13 MED ORDER — OXYCODONE HCL 5 MG/5ML PO SOLN
5.0000 mg | Freq: Four times a day (QID) | ORAL | Status: DC
  Administered 2020-06-13 – 2020-06-14 (×4): 5 mg
  Filled 2020-06-13 (×4): qty 5

## 2020-06-13 MED ORDER — SODIUM CHLORIDE 0.9 % IV SOLN
2.0000 g | Freq: Three times a day (TID) | INTRAVENOUS | Status: AC
  Administered 2020-06-13 – 2020-06-19 (×20): 2 g via INTRAVENOUS
  Filled 2020-06-13 (×20): qty 2

## 2020-06-13 MED ORDER — POTASSIUM CHLORIDE 20 MEQ PO PACK
40.0000 meq | PACK | Freq: Once | ORAL | Status: AC
  Administered 2020-06-13: 40 meq via ORAL
  Filled 2020-06-13: qty 2

## 2020-06-13 MED ORDER — DEXTROSE 50 % IV SOLN
INTRAVENOUS | Status: AC
  Filled 2020-06-13: qty 50

## 2020-06-13 MED ORDER — NOREPINEPHRINE 16 MG/250ML-% IV SOLN
0.0000 ug/min | INTRAVENOUS | Status: DC
  Administered 2020-06-17: 2 ug/min via INTRAVENOUS
  Administered 2020-06-18: 7 ug/min via INTRAVENOUS
  Administered 2020-06-18: 2 ug/min via INTRAVENOUS
  Administered 2020-06-20: 20 ug/min via INTRAVENOUS
  Administered 2020-06-21: 35 ug/min via INTRAVENOUS
  Administered 2020-06-21: 39 ug/min via INTRAVENOUS
  Administered 2020-06-21: 40 ug/min via INTRAVENOUS
  Administered 2020-06-22 (×3): 60 ug/min via INTRAVENOUS
  Filled 2020-06-13 (×11): qty 250

## 2020-06-13 MED ORDER — CLONAZEPAM 0.5 MG PO TABS
1.0000 mg | ORAL_TABLET | Freq: Two times a day (BID) | ORAL | Status: DC
  Administered 2020-06-13 – 2020-06-14 (×4): 1 mg
  Filled 2020-06-13 (×4): qty 2

## 2020-06-13 NOTE — Progress Notes (Signed)
CRITICAL VALUE ALERT  Critical Value:  pCO2 67.1  Date & Time Notied:  06/13/20 2300  Provider Notified: [email or web address]-link  Orders Received/Actions taken: prone tonight, RN will continue to monitor

## 2020-06-13 NOTE — Progress Notes (Signed)
Updated pts daughter via phone. Jim Clarke

## 2020-06-13 NOTE — Progress Notes (Signed)
Pharmacy Antibiotic Note  Jim Clarke is a 79 y.o. male admitted on 05/26/2020 with COVID PNA. Patient is intubated. Pharmacy has been consulted for Unasyn dosing for aspiration PNA. GNR in trach aspirate.   8/1 TA now with Enterobacter  Plan: Change antibiotics to cefepime 2 g iv q 8 h  Will f/u renal function, planned duration   Height: 5\' 7"  (170.2 cm) Weight: 79.8 kg (175 lb 14.8 oz) IBW/kg (Calculated) : 66.1  Temp (24hrs), Avg:98.5 F (36.9 C), Min:97.7 F (36.5 C), Max:99 F (37.2 C)  Recent Labs  Lab 06/09/20 0434 06/10/20 0347 06/11/20 0337 06/12/20 0500 06/13/20 0446  WBC 13.4* 16.9* 13.5* 13.1* 9.9  CREATININE 0.92 0.85 0.76 0.71 0.90    Estimated Creatinine Clearance: 68.5 mL/min (by C-G formula based on SCr of 0.9 mg/dL).    Allergies  Allergen Reactions  . Sulfa Antibiotics    Remdesivir 7/17 >> 7/21 Actemra 7/17  7/31 Unasyn >> 8/1 8/1 cefepime >>  7/17 BCx: NGF 7/17 MRSA PCR: neg 7/17 COVID: positive   7/30 TA: Enterobacter aerogenes  Thank you for allowing pharmacy to be a part of this patient's care.  Jim Clarke D 06/13/2020 10:55 AM

## 2020-06-13 NOTE — Progress Notes (Signed)
Assisted video call for patients daughter. Etta Quill, RN

## 2020-06-13 NOTE — Progress Notes (Signed)
NAME:  Jim Clarke, MRN:  [id number], DOB:  11/19/1940, LOS: 81 ADMISSION DATE:  06/12/2020, CONSULTATION DATE:  7/23 REFERRING MD:  Bonner Puna, CHIEF COMPLAINT:  Dyspnea   Brief History   79 y/o male admitted with COVID pneumonia on 7/17 after having symptoms for about 10 days.  He was treated with heated high flow, actemra, remdesivir and systemic steroids.  PCCM consulted on 7/23 in the setting of worsening agitated delirium and oxygenation.    Past Medical History  Hyeprtension GERD Hyperlipidemia DM2 Arthritis  Significant Hospital Events   7/17 Admission 7/23 Intubation, paralysed 7/24 Started proning 7/25 Off Levophed 7/26 Off paralytics, proning continued 7/27 vomited while prone  Consults:  PCCM  Procedures:  7/23 ETT >  7/23 Left subclavian central line >  Radial A line 7/24 >   Significant Diagnostic Tests:    Micro Data:  7/17 SARS COV 2 > positive 7/17 blood culture > ng 7/30 resp >> enterobacter R cefazolin  Antimicrobials:  7/17 actemra > 7/18 7/17 remdesivir > 7/21 7/17 methylprednisolone >   Interim history/subjective:   Critically ill, intubated Deeply sedated on Versed/fentanyl , better vent synchrony Off Levophed Diuresed well with Lasix   Objective   Blood pressure (!) 158/92, pulse 99, temperature 99.1 F (37.3 C), resp. rate (!) 30, height 5\' 7"  (1.702 m), weight 79.8 kg, SpO2 94 %. CVP:  [1 mmHg-6 mmHg] 3 mmHg  Vent Mode: PRVC FiO2 (%):  [60 %-80 %] 60 % Set Rate:  [30 bmp] 30 bmp Vt Set:  [400 mL] 400 mL PEEP:  [14 cmH20] 14 cmH20 Plateau Pressure:  [25 cmH20-30 cmH20] 27 cmH20   Intake/Output Summary (Last 24 hours) at 06/13/2020 1145 Last data filed at 06/13/2020 1042 Gross per 24 hour  Intake 2697.53 ml  Output 2800 ml  Net -102.47 ml   Filed Weights   06/11/20 0340 06/12/20 0420 06/13/20 0436  Weight: 82.6 kg 86.5 kg 79.8 kg    Examination:  General:  In bed on vent , elderly man, prone position HENT: NCAT ETT in  place PULM: CTA B, vent supported breathing , plateau 26, on 70 percent/PEEP of 14 CV: RRR, no mgr GI: BS+, soft, nontender MSK: normal bulk and tone Neuro: sedated on vent , RASS -5.  Chest x-ray 7/31 personally reviewed which shows left more than right extensive airspace disease Labs show mild hypokalemia, mild hypernatremia, resolved leukocytosis ABG when supine shows better oxygenation, compensated hypercarbia   Resolved Hospital Problem list     Assessment & Plan:  ARDS due to COVID 19 pneumonia    Continue mechanical ventilation per ARDS protocol Target TVol 6-8cc/kgIBW At Target Plateau Pressure < 30cm H20 At Target driving pressure less than 15 cm of water Target PaO2 55-65: titrate PEEP/FiO2 per protocol As long as PaO2 to FiO2 ratio is less than 1:150 position ct prone position for 16 hours a day Check CVP daily if CVL in place Target CVP less than 4, currently 3, continue Lasix 40 every 12 Ventilator associated pneumonia prevention protocol 12/1 better vent synchrony on deeper sedation, maintain PEEP of 14, FiO2 slowly coming down to 70 percent but  anticipate proning again today  HAP/aspiration pneumonia, Enterobacter -Simplify antibiotics to cefepime -Aspiration precautions  Acute metabolic encephalopathy due to ICU delirium PAD protocol RASS target - 4 to -5 , deep sedation since receiving as needed paralytic Fentanyl/versed infusions per protocol Add enteral oxycodone and clonazepam  DM2 with hyperglycemia, complicated by steroids SSI Glargine 35 u  q hs linagliptin   Hypernatremia -Increase free water 300 q 4h   Best practice:  Diet: tube feeding Pain/Anxiety/Delirium protocol (if indicated): as above VAP protocol (if indicated): yes DVT prophylaxis: lovenox GI prophylaxis: famoditine Glucose control: as above Mobility: bed rest Code Status: full Family Communication:  Wife & daughter by phone on 7/31& 8/1 Disposition: remain in ICU  Labs     CBC: Recent Labs  Lab 06/09/20 0434 06/10/20 0347 06/11/20 0337 06/12/20 0500 06/13/20 0446  WBC 13.4* 16.9* 13.5* 13.1* 9.9  NEUTROABS  --  15.5* 11.9*  --   --   HGB 14.8 15.0 14.3 14.2 13.7  HCT 46.3 47.1 44.7 45.8 43.9  MCV 98.3 98.3 99.3 102.2* 101.9*  PLT 125* 123* 125* 129* 139*    Basic Metabolic Panel: Recent Labs  Lab 06/07/20 0500 06/07/20 1432 06/08/20 0320 06/08/20 1654 06/09/20 0434 06/10/20 0347 06/11/20 0337 06/12/20 0500 06/13/20 0446  NA 138   < > 137   < > 140 145 144 147* 151*  K 6.3*   < > 5.4*   < > 4.6 4.2 3.6 3.6 3.3*  CL 98   < > 89*   < > 87* 88* 93* 96* 98  CO2 31   < > 38*   < > 39* 41* 39* 40* 43*  GLUCOSE 366*   < > 278*   < > 351* 221* 139* 107* 57*  BUN 40*   < > 54*   < > 63* 66* 61* 67* 68*  CREATININE 1.02   < > 1.06   < > 0.92 0.85 0.76 0.71 0.90  CALCIUM 8.1*   < > 8.4*   < > 8.6* 9.2 8.5* 8.7* 8.7*  MG 2.0  --  2.0  --  2.1  --   --  2.3 2.1  PHOS 3.5  --  3.8  --  4.6  --   --  4.0 4.2   < > = values in this interval not displayed.   GFR: Estimated Creatinine Clearance: 68.5 mL/min (by C-G formula based on SCr of 0.9 mg/dL). Recent Labs  Lab 06/10/20 0347 06/11/20 0337 06/12/20 0500 06/13/20 0446  WBC 16.9* 13.5* 13.1* 9.9    Liver Function Tests: Recent Labs  Lab 06/10/20 0347 06/11/20 0337  AST 47* 34  ALT 38 32  ALKPHOS 71 67  BILITOT 1.1 0.9  PROT 6.4* 5.7*  ALBUMIN 3.1* 2.7*   No results for input(s): LIPASE, AMYLASE in the last 168 hours. No results for input(s): AMMONIA in the last 168 hours.  ABG    Component Value Date/Time   PHART 7.413 06/12/2020 2140   PCO2ART 70.1 (HH) 06/12/2020 2140   PO2ART 96.7 06/12/2020 2140   HCO3 43.8 (H) 06/12/2020 2140   ACIDBASEDEF 0.4 06/06/2020 1100   O2SAT 98.4 06/12/2020 2140     Coagulation Profile: No results for input(s): INR, PROTIME in the last 168 hours.  Cardiac Enzymes: No results for input(s): CKTOTAL, CKMB, CKMBINDEX, TROPONINI in the last  168 hours.  HbA1C: Hemoglobin A1C  Date/Time Value Ref Range Status  03/31/2020 10:39 AM 7.2 (A) 4.0 - 5.6 % Final  03/04/2018 10:54 AM 7.8  Final   Hgb A1c MFr Bld  Date/Time Value Ref Range Status  05/31/2020 10:49 AM 7.5 (H) 4.8 - 5.6 % Final    Comment:    (NOTE) Pre diabetes:          5.7%-6.4%  Diabetes:              >  6.4%  Glycemic control for   <7.0% adults with diabetes     CBG: Recent Labs  Lab 06/10/20 2013 06/10/20 2356 06/11/20 0324 06/11/20 0845 06/11/20 1139  GLUCAP 141* 190* 149* 190* 191*     Critical care time: 35 minutes    Kara Mead MD. FCCP. [facility]  If no response to pager , please call [phone]   06/13/2020

## 2020-06-13 NOTE — Progress Notes (Signed)
[facility] Progress Note Patient Name: Jim Clarke DOB: [birth year] MRN: [id number]   Date of Service  06/13/2020  HPI/Events of Note  ABG on 60%/PRVC 30/TV 400/P 14 = 7.43/67.1/72.4. P/F ration = 120.   eICU Interventions  Plan: 1. P/F ratio = 120. Will require proning as P/F ration < 150.      Intervention Category Major Interventions: Respiratory failure - evaluation and management  Kaylea Mounsey Eugene 06/13/2020, 11:36 PM

## 2020-06-13 NOTE — Progress Notes (Signed)
Assisted tele visit to patient with family member.  Shamya Macfadden M, RN  

## 2020-06-13 DEATH — deceased

## 2020-06-14 ENCOUNTER — Inpatient Hospital Stay (HOSPITAL_COMMUNITY): Payer: Medicare HMO

## 2020-06-14 LAB — CBC
HCT: 43.7 % (ref 39.0–52.0)
Hemoglobin: 13.4 g/dL (ref 13.0–17.0)
MCH: 31.6 pg (ref 26.0–34.0)
MCHC: 30.7 g/dL (ref 30.0–36.0)
MCV: 103.1 fL — ABNORMAL HIGH (ref 80.0–100.0)
Platelets: 123 10*3/uL — ABNORMAL LOW (ref 150–400)
RBC: 4.24 MIL/uL (ref 4.22–5.81)
RDW: 14.4 % (ref 11.5–15.5)
WBC: 8.3 10*3/uL (ref 4.0–10.5)
nRBC: 0 % (ref 0.0–0.2)

## 2020-06-14 LAB — BASIC METABOLIC PANEL
Anion gap: 13 (ref 5–15)
BUN: 62 mg/dL — ABNORMAL HIGH (ref 8–23)
CO2: 40 mmol/L — ABNORMAL HIGH (ref 22–32)
Calcium: 8.7 mg/dL — ABNORMAL LOW (ref 8.9–10.3)
Chloride: 100 mmol/L (ref 98–111)
Creatinine, Ser: 0.88 mg/dL (ref 0.61–1.24)
GFR calc Af Amer: >60 mL/min (ref >60)
GFR calc non Af Amer: >60 mL/min (ref >60)
Glucose, Bld: 142 mg/dL — ABNORMAL HIGH (ref 70–99)
Potassium: 3.4 mmol/L — ABNORMAL LOW (ref 3.5–5.1)
Sodium: 153 mmol/L — ABNORMAL HIGH (ref 135–145)

## 2020-06-14 LAB — GLUCOSE, CAPILLARY
Glucose-Capillary: 178 mg/dL — ABNORMAL HIGH (ref 70–99)
Glucose-Capillary: 173 mg/dL — ABNORMAL HIGH (ref 70–99)
Glucose-Capillary: 143 mg/dL — ABNORMAL HIGH (ref 70–99)
Glucose-Capillary: 109 mg/dL — ABNORMAL HIGH (ref 70–99)

## 2020-06-14 LAB — PHOSPHORUS: Phosphorus: 3.1 mg/dL (ref 2.5–4.6)

## 2020-06-14 LAB — MAGNESIUM: Magnesium: 2 mg/dL (ref 1.7–2.4)

## 2020-06-14 MED ORDER — POTASSIUM CHLORIDE 10 MEQ/50ML IV SOLN
10.0000 meq | INTRAVENOUS | Status: AC
  Administered 2020-06-14 (×4): 10 meq via INTRAVENOUS
  Filled 2020-06-14 (×4): qty 50

## 2020-06-14 MED ORDER — MAGNESIUM SULFATE IN D5W 1-5 GM/100ML-% IV SOLN
1.0000 g | Freq: Once | INTRAVENOUS | Status: AC
  Administered 2020-06-14: 1 g via INTRAVENOUS
  Filled 2020-06-14: qty 100

## 2020-06-14 MED ORDER — FREE WATER
300.0000 mL | Status: DC
  Administered 2020-06-14 – 2020-06-20 (×46): 300 mL

## 2020-06-14 MED ORDER — POTASSIUM CHLORIDE CRYS ER 20 MEQ PO TBCR
40.0000 meq | EXTENDED_RELEASE_TABLET | Freq: Once | ORAL | Status: AC
  Administered 2020-06-14: 40 meq via ORAL
  Filled 2020-06-14: qty 2

## 2020-06-14 NOTE — Progress Notes (Signed)
NAME:  Jim Clarke, MRN:  [id number], DOB:  Mar 25, 1941, LOS: 31 ADMISSION DATE:  06/07/2020, CONSULTATION DATE:  7/23 REFERRING MD:  Bonner Puna, CHIEF COMPLAINT:  Dyspnea   Brief History   79 y/o male admitted with COVID pneumonia on 7/17 after having symptoms for about 10 days.  He was treated with heated high flow, actemra, remdesivir and systemic steroids.  PCCM consulted on 7/23 in the setting of worsening agitated delirium and oxygenation.    Past Medical History  Hyeprtension GERD Hyperlipidemia DM2 Arthritis  Significant Hospital Events   7/17 Admission 7/23 Intubation, paralysed 7/24 Started proning 7/25 Off Levophed 7/26 Off paralytics, proning continued 7/27 vomited while prone  Consults:  PCCM  Procedures:  7/23 ETT >  7/23 Left subclavian central line >  Radial A line 7/24 >   Significant Diagnostic Tests:    Micro Data:  7/17 SARS COV 2 > positive 7/17 blood culture >  7/30 resp culture > enterobacter  Antimicrobials:  7/17 actemra > 7/18 7/17 remdesivir > 7/21 7/17 methylprednisolone >   7/31 unasyn> 8/1 8/1 cefepime >   Interim history/subjective:   Prone position Had some aspiration over the weekend when in the prone position  Objective   Blood pressure (!) 158/92, pulse 91, temperature (!) 96.4 F (35.8 C), temperature source Bladder, resp. rate (!) 30, height 5\' 7"  (1.702 m), weight 81.4 kg, SpO2 94 %. CVP:  [3 mmHg-4 mmHg] 4 mmHg  Vent Mode: PRVC FiO2 (%):  [60 %] 60 % Set Rate:  [30 bmp] 30 bmp Vt Set:  [400 mL] 400 mL PEEP:  [14 cmH20] 14 cmH20 Plateau Pressure:  [22 cmH20-29 cmH20] 22 cmH20   Intake/Output Summary (Last 24 hours) at 06/14/2020 1132 Last data filed at 06/14/2020 1100 Gross per 24 hour  Intake 2904.11 ml  Output 2300 ml  Net 604.11 ml   Filed Weights   06/12/20 0420 06/13/20 0436 06/14/20 0325  Weight: 86.5 kg 79.8 kg 81.4 kg    Examination:  General:  In bed on vent, prone position HENT: NCAT ETT in  place PULM: Coarse crackles and wheezing bilaterally, vent supported breathing CV: RRR, no mgr GI: BS+, soft, nontender MSK: normal bulk and tone Neuro: sedated on vent   Resolved Hospital Problem list     Assessment & Plan:  ARDS due to COVID 19 pneumonia  Continue mechanical ventilation per ARDS protocol Target TVol 6-8cc/kgIBW Target Plateau Pressure < 30cm H20 Target driving pressure less than 15 cm of water Target PaO2 55-65: titrate PEEP/FiO2 per protocol As long as PaO2 to FiO2 ratio is less than 1:150 position in prone position for 16 hours a day Check CVP daily if CVL in place Target CVP less than 4, diurese as necessary Ventilator associated pneumonia prevention protocol 8/2 plan : hold placing in prone position overnight, follow CVP, consider diuresis depending on how his pressure is doing  Acute metabolic encephalopathy due to ICU delirium PAD protocol RASS target -2 to -3 Fentanyl/versed infusions to continue Clonazepam to continue Stop oxycodone with slow gut motility  DM2 with hyperglycemia, complicated by steroids Linaglipitin SSI Glargine 35 units  Aspiration recurrent when in prone position Enterobacter pneumonia Plan cefepime 7 days No prone positioning Stop oxycodone  Hyperkalemia > improved Hypernatremia > worse Increase free water Monitor BMET and UOP Replace electrolytes as needed  Hypertension > soft BP, was on vasopressors through 8/1 Hold amlodipine and labetalol   Best practice:  Diet: tube feeding Pain/Anxiety/Delirium protocol (if indicated): as  above VAP protocol (if indicated): yes DVT prophylaxis: lovenox GI prophylaxis: famoditine Glucose control: as above Mobility: bed rest Code Status: full Family Communication: updated his wife by phone on 8/2 Disposition: remain in ICU  Labs   CBC: Recent Labs  Lab 06/10/20 0347 06/11/20 0337 06/12/20 0500 06/13/20 0446 06/14/20 0146  WBC 16.9* 13.5* 13.1* 9.9 8.3   NEUTROABS 15.5* 11.9*  --   --   --   HGB 15.0 14.3 14.2 13.7 13.4  HCT 47.1 44.7 45.8 43.9 43.7  MCV 98.3 99.3 102.2* 101.9* 103.1*  PLT 123* 125* 129* 139* 123*    Basic Metabolic Panel: Recent Labs  Lab 06/08/20 0320 06/08/20 1654 06/09/20 0434 06/09/20 0434 06/10/20 0347 06/11/20 0337 06/12/20 0500 06/13/20 0446 06/14/20 0146  NA 137   < > 140   < > 145 144 147* 151* 153*  K 5.4*   < > 4.6   < > 4.2 3.6 3.6 3.3* 3.4*  CL 89*   < > 87*   < > 88* 93* 96* 98 100  CO2 38*   < > 39*   < > 41* 39* 40* 43* 40*  GLUCOSE 278*   < > 351*   < > 221* 139* 107* 57* 142*  BUN 54*   < > 63*   < > 66* 61* 67* 68* 62*  CREATININE 1.06   < > 0.92   < > 0.85 0.76 0.71 0.90 0.88  CALCIUM 8.4*   < > 8.6*   < > 9.2 8.5* 8.7* 8.7* 8.7*  MG 2.0  --  2.1  --   --   --  2.3 2.1 2.0  PHOS 3.8  --  4.6  --   --   --  4.0 4.2 3.1   < > = values in this interval not displayed.   GFR: Estimated Creatinine Clearance: 70.7 mL/min (by C-G formula based on SCr of 0.88 mg/dL). Recent Labs  Lab 06/11/20 0337 06/12/20 0500 06/13/20 0446 06/14/20 0146  WBC 13.5* 13.1* 9.9 8.3    Liver Function Tests: Recent Labs  Lab 06/10/20 0347 06/11/20 0337  AST 47* 34  ALT 38 32  ALKPHOS 71 67  BILITOT 1.1 0.9  PROT 6.4* 5.7*  ALBUMIN 3.1* 2.7*   No results for input(s): LIPASE, AMYLASE in the last 168 hours. No results for input(s): AMMONIA in the last 168 hours.  ABG    Component Value Date/Time   PHART 7.430 06/13/2020 2200   PCO2ART 67.1 (HH) 06/13/2020 2200   PO2ART 72.4 (L) 06/13/2020 2200   HCO3 43.9 (H) 06/13/2020 2200   ACIDBASEDEF 0.4 06/06/2020 1100   O2SAT 93.8 06/13/2020 2200     Coagulation Profile: No results for input(s): INR, PROTIME in the last 168 hours.  Cardiac Enzymes: No results for input(s): CKTOTAL, CKMB, CKMBINDEX, TROPONINI in the last 168 hours.  HbA1C: Hemoglobin A1C  Date/Time Value Ref Range Status  03/31/2020 10:39 AM 7.2 (A) 4.0 - 5.6 % Final   03/04/2018 10:54 AM 7.8  Final   Hgb A1c MFr Bld  Date/Time Value Ref Range Status  05/31/2020 10:49 AM 7.5 (H) 4.8 - 5.6 % Final    Comment:    (NOTE) Pre diabetes:          5.7%-6.4%  Diabetes:              >6.4%  Glycemic control for   <7.0% adults with diabetes     CBG: Recent Labs  Lab 06/10/20  2013 06/10/20 2356 06/11/20 0324 06/11/20 0845 06/11/20 1139  GLUCAP 141* 190* 149* 190* 191*     Critical care time: 40 minutes     [name], MD [place] Pager: [phone] Cell: [phone] If no response, call [phone]

## 2020-06-14 NOTE — Progress Notes (Signed)
   06/14/20 1100  Clinical Encounter Type  Visited With Family;Other (Comment) (Daughter )  Visit Type Follow-up;Psychological support;Spiritual support  Spiritual Encounters  Spiritual Needs Emotional;Other (Comment) (On-going spiritual care )  Stress Factors  Patient Stress Factors Not reviewed  Family Stress Factors Health changes   I followed up with Romin' daughter for on-going support. She was very optimistic about Kirtan' condition improving and was grateful for the care that he has received.   Chaplain Shanon Ace M.Div., [facility]

## 2020-06-14 NOTE — Progress Notes (Signed)
[facility] Progress Note Patient Name: Jim Clarke DOB: 26-Apr-1941 MRN: [id number]   Date of Service  06/14/2020  HPI/Events of Note  NSVT - 10 beat run of NSVT. K+ =3.6, Mg++ = 2.0 and Creatinine = 2.0   eICU Interventions  Plan: 1. Replace Mg++ and K+.  2. Continue to monitor cardiac rhythm.     Intervention Category Major Interventions: Arrhythmia - evaluation and management  Deshaun Schou Eugene 06/14/2020, 3:22 AM

## 2020-06-14 NOTE — Progress Notes (Signed)
Pt had 10 beat run of v tach. AM labs drawn & E-link made aware. RN will continue to monitor

## 2020-06-15 ENCOUNTER — Inpatient Hospital Stay (HOSPITAL_COMMUNITY): Payer: Medicare HMO

## 2020-06-15 ENCOUNTER — Inpatient Hospital Stay: Payer: Self-pay

## 2020-06-15 LAB — GLUCOSE, CAPILLARY
Glucose-Capillary: 192 mg/dL — ABNORMAL HIGH (ref 70–99)
Glucose-Capillary: 214 mg/dL — ABNORMAL HIGH (ref 70–99)
Glucose-Capillary: 110 mg/dL — ABNORMAL HIGH (ref 70–99)
Glucose-Capillary: 118 mg/dL — ABNORMAL HIGH (ref 70–99)
Glucose-Capillary: 167 mg/dL — ABNORMAL HIGH (ref 70–99)
Glucose-Capillary: 172 mg/dL — ABNORMAL HIGH (ref 70–99)
Glucose-Capillary: 200 mg/dL — ABNORMAL HIGH (ref 70–99)
Glucose-Capillary: 204 mg/dL — ABNORMAL HIGH (ref 70–99)
Glucose-Capillary: 209 mg/dL — ABNORMAL HIGH (ref 70–99)
Glucose-Capillary: 228 mg/dL — ABNORMAL HIGH (ref 70–99)
Glucose-Capillary: 237 mg/dL — ABNORMAL HIGH (ref 70–99)
Glucose-Capillary: 274 mg/dL — ABNORMAL HIGH (ref 70–99)
Glucose-Capillary: 42 mg/dL — CL (ref 70–99)
Glucose-Capillary: 85 mg/dL (ref 70–99)

## 2020-06-15 LAB — COMPREHENSIVE METABOLIC PANEL
ALT: 23 U/L (ref 0–44)
AST: 27 U/L (ref 15–41)
Albumin: 2.5 g/dL — ABNORMAL LOW (ref 3.5–5.0)
Alkaline Phosphatase: 64 U/L (ref 38–126)
Anion gap: 13 (ref 5–15)
BUN: 72 mg/dL — ABNORMAL HIGH (ref 8–23)
CO2: 37 mmol/L — ABNORMAL HIGH (ref 22–32)
Calcium: 8.6 mg/dL — ABNORMAL LOW (ref 8.9–10.3)
Chloride: 98 mmol/L (ref 98–111)
Creatinine, Ser: 0.97 mg/dL (ref 0.61–1.24)
GFR calc Af Amer: >60 mL/min (ref >60)
GFR calc non Af Amer: >60 mL/min (ref >60)
Glucose, Bld: 231 mg/dL — ABNORMAL HIGH (ref 70–99)
Potassium: 4.1 mmol/L (ref 3.5–5.1)
Sodium: 148 mmol/L — ABNORMAL HIGH (ref 135–145)
Total Bilirubin: 0.7 mg/dL (ref 0.3–1.2)
Total Protein: 5.5 g/dL — ABNORMAL LOW (ref 6.5–8.1)

## 2020-06-15 LAB — CBC WITH DIFFERENTIAL/PLATELET
Abs Immature Granulocytes: 0.04 10*3/uL (ref 0.00–0.07)
Basophils Absolute: 0 10*3/uL (ref 0.0–0.1)
Basophils Relative: 0 %
Eosinophils Absolute: 0.3 10*3/uL (ref 0.0–0.5)
Eosinophils Relative: 3 %
HCT: 41.2 % (ref 39.0–52.0)
Hemoglobin: 12.6 g/dL — ABNORMAL LOW (ref 13.0–17.0)
Immature Granulocytes: 1 %
Lymphocytes Relative: 7 %
Lymphs Abs: 0.5 10*3/uL — ABNORMAL LOW (ref 0.7–4.0)
MCH: 31.6 pg (ref 26.0–34.0)
MCHC: 30.6 g/dL (ref 30.0–36.0)
MCV: 103.3 fL — ABNORMAL HIGH (ref 80.0–100.0)
Monocytes Absolute: 0.5 10*3/uL (ref 0.1–1.0)
Monocytes Relative: 7 %
Neutro Abs: 6.3 10*3/uL (ref 1.7–7.7)
Neutrophils Relative %: 82 %
Platelets: 104 10*3/uL — ABNORMAL LOW (ref 150–400)
RBC: 3.99 MIL/uL — ABNORMAL LOW (ref 4.22–5.81)
RDW: 14.3 % (ref 11.5–15.5)
WBC: 7.7 10*3/uL (ref 4.0–10.5)
nRBC: 0 % (ref 0.0–0.2)

## 2020-06-15 MED ORDER — SODIUM CHLORIDE 0.9% FLUSH
10.0000 mL | Freq: Two times a day (BID) | INTRAVENOUS | Status: DC
  Administered 2020-06-16 – 2020-06-20 (×7): 10 mL

## 2020-06-15 MED ORDER — SODIUM CHLORIDE 0.9% FLUSH: 10.0000 mL | INTRAVENOUS | Status: DC | PRN

## 2020-06-15 MED ORDER — PIVOT 1.5 CAL PO LIQD
1000.0000 mL | ORAL | Status: DC
  Administered 2020-06-15 – 2020-06-17 (×3): 1000 mL
  Filled 2020-06-15 (×4): qty 1000

## 2020-06-15 MED ORDER — PROSOURCE TF PO LIQD
45.0000 mL | Freq: Every day | ORAL | Status: DC
  Administered 2020-06-16 – 2020-06-21 (×6): 45 mL
  Filled 2020-06-15 (×7): qty 45

## 2020-06-15 NOTE — Progress Notes (Signed)
NAME:  Jim Clarke, MRN:  [id number], DOB:  [birth year], LOS: 26 ADMISSION DATE:  05/27/2020, CONSULTATION DATE:  7/23 REFERRING MD:  Bonner Puna, CHIEF COMPLAINT:  Dyspnea   Brief History   79 y/o male admitted with COVID pneumonia on 7/17 after having symptoms for about 10 days.  He was treated with heated high flow, actemra, remdesivir and systemic steroids.  PCCM consulted on 7/23 in the setting of worsening agitated delirium and oxygenation.    Past Medical History  Hyeprtension GERD Hyperlipidemia DM2 Arthritis  Significant Hospital Events   7/17 Admission 7/23 Intubation, paralysed 7/24 Started proning 7/25 Off Levophed 7/26 Off paralytics, proning continued 7/27 vomited while prone 7/28-7/30 repeated aspiration in prone position 7/30 enterobacter pneumonia, pressors briefly  Consults:  PCCM  Procedures:  7/23 ETT >  7/23 Left subclavian central line > 8/3 Radial A line 7/24 >  8/3 PICC >   Significant Diagnostic Tests:    Micro Data:  7/17 SARS COV 2 > positive 7/17 blood culture >  7/30 resp culture > enterobacter  Antimicrobials:  7/17 actemra > 7/18 7/17 remdesivir > 7/21 7/17 methylprednisolone >   7/31 unasyn> 8/1 8/1 cefepime >   Interim history/subjective:   Brief period of hypoxemia overnight Tolerating tube feeding Remains off vasopressors  Objective   Blood pressure (!) 158/92, pulse 88, temperature 98.4 F (36.9 C), resp. rate (!) 30, height 5\' 7"  (1.702 m), weight 86.1 kg, SpO2 90 %. CVP:  [4 mmHg-8 mmHg] 8 mmHg  Vent Mode: PRVC FiO2 (%):  [60 %-100 %] 65 % Set Rate:  [30 bmp] 30 bmp Vt Set:  [400 mL] 400 mL PEEP:  [14 cmH20] 14 cmH20 Plateau Pressure:  [22 cmH20-30 cmH20] 27 cmH20   Intake/Output Summary (Last 24 hours) at 06/15/2020 0754 Last data filed at 06/15/2020 0600 Gross per 24 hour  Intake 2684.15 ml  Output 2250 ml  Net 434.15 ml   Filed Weights   06/13/20 0436 06/14/20 0325 06/15/20 0355  Weight: 79.8 kg 81.4 kg 86.1  kg    Examination:  General:  In bed on vent HENT: NCAT ETT in place PULM: CTA B, vent supported breathing CV: RRR, no mgr GI: BS+, soft, nontender MSK: normal bulk and tone Neuro: sedated on vent    Resolved Hospital Problem list     Assessment & Plan:  ARDS due to COVID 19 pneumonia  Continue mechanical ventilation per ARDS protocol Target TVol 6-8cc/kgIBW Target Plateau Pressure < 30cm H20 Target driving pressure less than 15 cm of water Target PaO2 55-65: titrate PEEP/FiO2 per protocol As long as PaO2 to FiO2 ratio is less than 1:150 position in prone position for 16 hours a day Check CVP daily if CVL in place Target CVP less than 4, diurese as necessary Ventilator associated pneumonia prevention protocol 8/3 no prone positioning due to aspiration events, hold lasix given CVP 4  Acute metabolic encephalopathy due to ICU delirium PAD protocol RASS target -2 to -3 Fentanyl/versed infusions> wean per PAD for RASS target Stop clonazepam  DM2 with hyperglycemia, complicated by steroids linagliptin SSI Glargine 35 daily  Aspiration recurrent when in prone position Enterobacter pneumonia Plan cefepime 7 days  Hyperkalemia > improved Hypernatremia > improving Continue free water Monitor BMET and UOP Replace electrolytes as needed  Hypertension > soft BP, was on vasopressors through 8/1 Monitor  Redness around left subclavian line Change to PICC  Best practice:  Diet: tube feeding Pain/Anxiety/Delirium protocol (if indicated): as above VAP protocol (if  indicated): yes DVT prophylaxis: lovenox GI prophylaxis: famoditine Glucose control: as above Mobility: bed rest Code Status: full Family Communication: updated his wife by phone on 8/3 Disposition: remain in ICU  Labs   CBC: Recent Labs  Lab 06/10/20 0347 06/10/20 0347 06/11/20 0337 06/12/20 0500 06/13/20 0446 06/14/20 0146 06/15/20 0521  WBC 16.9*   < > 13.5* 13.1* 9.9 8.3 7.7  NEUTROABS  15.5*  --  11.9*  --   --   --  6.3  HGB 15.0   < > 14.3 14.2 13.7 13.4 12.6*  HCT 47.1   < > 44.7 45.8 43.9 43.7 41.2  MCV 98.3   < > 99.3 102.2* 101.9* 103.1* 103.3*  PLT 123*   < > 125* 129* 139* 123* 104*   < > = values in this interval not displayed.    Basic Metabolic Panel: Recent Labs  Lab 06/09/20 0434 06/10/20 0347 06/11/20 0337 06/12/20 0500 06/13/20 0446 06/14/20 0146 06/15/20 0521  NA 140   < > 144 147* 151* 153* 148*  K 4.6   < > 3.6 3.6 3.3* 3.4* 4.1  CL 87*   < > 93* 96* 98 100 98  CO2 39*   < > 39* 40* 43* 40* 37*  GLUCOSE 351*   < > 139* 107* 57* 142* 231*  BUN 63*   < > 61* 67* 68* 62* 72*  CREATININE 0.92   < > 0.76 0.71 0.90 0.88 0.97  CALCIUM 8.6*   < > 8.5* 8.7* 8.7* 8.7* 8.6*  MG 2.1  --   --  2.3 2.1 2.0  --   PHOS 4.6  --   --  4.0 4.2 3.1  --    < > = values in this interval not displayed.   GFR: Estimated Creatinine Clearance: 65.8 mL/min (by C-G formula based on SCr of 0.97 mg/dL). Recent Labs  Lab 06/12/20 0500 06/13/20 0446 06/14/20 0146 06/15/20 0521  WBC 13.1* 9.9 8.3 7.7    Liver Function Tests: Recent Labs  Lab 06/10/20 0347 06/11/20 0337 06/15/20 0521  AST 47* 34 27  ALT 38 32 23  ALKPHOS 71 67 64  BILITOT 1.1 0.9 0.7  PROT 6.4* 5.7* 5.5*  ALBUMIN 3.1* 2.7* 2.5*   No results for input(s): LIPASE, AMYLASE in the last 168 hours. No results for input(s): AMMONIA in the last 168 hours.  ABG    Component Value Date/Time   PHART 7.430 06/13/2020 2200   PCO2ART 67.1 (HH) 06/13/2020 2200   PO2ART 72.4 (L) 06/13/2020 2200   HCO3 43.9 (H) 06/13/2020 2200   ACIDBASEDEF 0.4 06/06/2020 1100   O2SAT 93.8 06/13/2020 2200     Coagulation Profile: No results for input(s): INR, PROTIME in the last 168 hours.  Cardiac Enzymes: No results for input(s): CKTOTAL, CKMB, CKMBINDEX, TROPONINI in the last 168 hours.  HbA1C: Hemoglobin A1C  Date/Time Value Ref Range Status  03/31/2020 10:39 AM 7.2 (A) 4.0 - 5.6 % Final  03/04/2018  10:54 AM 7.8  Final   Hgb A1c MFr Bld  Date/Time Value Ref Range Status  05/31/2020 10:49 AM 7.5 (H) 4.8 - 5.6 % Final    Comment:    (NOTE) Pre diabetes:          5.7%-6.4%  Diabetes:              >6.4%  Glycemic control for   <7.0% adults with diabetes     CBG: Recent Labs  Lab 06/11/20 1139 06/13/20 1940 06/13/20  2328 06/14/20 0320 06/14/20 0742  GLUCAP 191* 178* 173* 143* 109*     Critical care time: 35 minutes     [name], MD [place] PCCM Pager: [phone] Cell: [phone] If no response, call [phone]

## 2020-06-15 NOTE — TOC Progression Note (Signed)
Transition of Care [facility]) - Progression Note    Patient Details  Name: DONATHAN BULLER MRN: [id number] Date of Birth: [birth year]  Transition of Care [facility]) CM/SW Contact  Leeroy Cha, RN Phone Number: 06/15/2020, 9:09 AM  Clinical Narrative:    Remains vent dependent, iv sedation, iv d10, iv levophed.  Being proned due to resp status. Vacinated x1 and wife was unvaccinated and + at home. Following for progression   Expected Discharge Plan: Home/Self Care Barriers to Discharge: Continued Medical Work up  Expected Discharge Plan and Services Expected Discharge Plan: Home/Self Care   Discharge Planning Services: CM Consult   Living arrangements for the past 2 months: Single Family Home                                       Social Determinants of Health (SDOH) Interventions    Readmission Risk Interventions No flowsheet data found.

## 2020-06-15 NOTE — Progress Notes (Signed)
Peripherally Inserted Central Catheter Placement  The IV Nurse has discussed with the patient and/or persons authorized to consent for the patient, the purpose of this procedure and the potential benefits and risks involved with this procedure.  The benefits include less needle sticks, lab draws from the catheter, and the patient may be discharged home with the catheter. Risks include, but not limited to, infection, bleeding, blood clot (thrombus formation), and puncture of an artery; nerve damage and irregular heartbeat and possibility to perform a PICC exchange if needed/ordered by physician.  Alternatives to this procedure were also discussed.  Bard Power PICC patient education guide, fact sheet on infection prevention and patient information card has been provided to patient /or left at bedside.  Telephone consent obtained from wife due to altered mental status.  PICC Placement Documentation  PICC Triple Lumen 04/54/09 PICC Right Basilic 43 cm 1 cm (Active)  Indication for Insertion or Continuance of Line Prolonged intravenous therapies 06/15/20 2112  Exposed Catheter (cm) 1 cm 06/15/20 2112  Site Assessment Clean;Dry;Intact 06/15/20 2112  Lumen #1 Status Flushed;Saline locked;Blood return noted 06/15/20 2112  Lumen #2 Status Flushed;Saline locked;Blood return noted 06/15/20 2112  Lumen #3 Status Flushed;Saline locked;Blood return noted 06/15/20 2112  Dressing Type Transparent 06/15/20 2112  Dressing Status Clean;Dry;Intact 06/15/20 2112  Dressing Intervention New dressing 06/15/20 2112  Dressing Change Due 06/29/2020 06/15/20 2112       Francella Barnett, Nicolette Bang 06/15/2020, 9:13 PM

## 2020-06-15 NOTE — Progress Notes (Signed)
[facility] Progress Note Patient Name: Jim Clarke DOB: 11/05/1941 MRN: [id number]   Date of Service  06/15/2020  HPI/Events of Note  Patient has not voided since foley removed. Bladder scan with 500 cc  eICU Interventions  Foley cath ordered     Intervention Category Intermediate Interventions: Oliguria - evaluation and management  Judd Lien 06/15/2020, 11:34 PM

## 2020-06-15 NOTE — Progress Notes (Signed)
Nutrition Follow-up  DOCUMENTATION CODES:   Non-severe (moderate) malnutrition in context of acute illness/injury  INTERVENTION:  - Pivot 1.5 @ 40 ml/hr to advance by 10 ml every 4 hours to reach goal rate of 55 ml/hr with 45 ml Prosource TF once/day. - at goal rate, this regimen will provide 2020 kcal, 135 grams protein, and 1002 ml free water.  - free water flush to continue to be per CCM.   NUTRITION DIAGNOSIS:   Moderate Malnutrition related to acute illness, catabolic illness (COVID-19 infection) as evidenced by mild fat depletion, mild muscle depletion, moderate muscle depletion. -ongoing  GOAL:   Patient will meet greater than or equal to 90% of their needs -met with TF regimen  MONITOR:   Vent status, TF tolerance, Labs, Weight trends, Skin  ASSESSMENT:   79 y.o. male with medical history of type 2 DM on oral hyperglycemic meds, former tobacco abuse, and obesity. He presented to the ED from monoclonal infusion clinic with hypoxia and SOB. He was vaccinated in 03/2020 and has been exposed to unvaccinated wife who is currently recovering from COVID. He called PCP on 7/15 for generalized malaise, fevers, coughing, SOB and was subsequently referred for outpatient monoclonal infusion scheduled for 7/17. While at infusion center, patient noted to have O2 sats into the 70% requiring up 6L on initial presentation.  Significant events: 7/17: admitted for PNA from COVID-19 infection 7/19- initial RD assessment 7/23: intubated early morning d/t ARDS; TF initiation  7/27- aspiration event following vomiting TF while proned overnight 7/28- NFPE completed indicating malnutrition 7/31- proned; vomited TF and OGT placed to LIS (see RN note at 0030); TF resumed during day shift when supine 8/1- proned overnight   Patient remains supine and was not proned over night d/t prior vomiting events while proned and desire to provide TF. Patient remains intubated with OGT in place and is receiving  Vital 1.5 @ 40 ml/hr with 90 ml Prosource TF TID and 300 ml free water every 3 hours. This regimen is providing 1680 kcal, 131 grams protein, and 3133 ml free water.   Able to talk with RNs caring for patient. Patient has remained supine today. Per rounds this AM, no plans to prone at any point today. RNs report that patient is tolerating TF regimen without issue, soft abdomen, no further vomiting events since 7/31.   Weight has been fluctuating. Weight today consistent with admission weight. Mild RUE edema noted in flow sheet.   Updated estimated kcal need based on medical course, disease severity.   Per notes: - ARDS d/t COVID-19 PNA - acute metabolic encephalopathy - hx of type 2 DM and currently on steroids - aspiration events while proned - plan for PICC   Patient is currently intubated on ventilator support MV: 12.3 L/min Temp (24hrs), Avg:98.2 F (36.8 C), Min:97.3 F (36.3 C), Max:98.6 F (37 C) Propofol: none   Labs reviewed; CBGs: 143, 109, 92, 214 mg/dl, Na: 742 mmol/l, BUN: 72 mg/dl, Ca: 8.6 mg/dl. Medications reviewed; 500 mg ascorbic acid/day, sliding scale novolog, 35 units lantus/day, 5 mg tradjenta/day, 1 tablet multivitamin with minerals/day, 17 g miralax/day, 40 mEq Klor-Con x1 dose 8/2, 220 mg zinc sulfate/day.  IVF; D10 @ 30 ml/hr (245 kcal). Drips; fentanyl @ 125 mcg/hr, versed @ 4 mg/hr.   Diet Order:   Diet Order    None      EDUCATION NEEDS:   No education needs have been identified at this time  Skin:  Skin Assessment: Skin Integrity Issues:  Skin Integrity Issues:: Unstageable Unstageable: full thickness to R nare (new doc 7/26)  Last BM:  8/2  Height:   Ht Readings from Last 1 Encounters:  06/09/20 '5\' 7"'$  (1.702 m)    Weight:   Wt Readings from Last 1 Encounters:  06/15/20 86.1 kg    Estimated Nutritional Needs:  Kcal:  1829-9371 kcal (24-26 kcal/kg) Protein:  125-150 grams (1.5-1.8 grams/kg) Fluid:  >/= 2.1 L/day     Jarome Matin, MS, RD, LDN, CNSC Inpatient Clinical Dietitian RD pager # available in [facility]  After hours/weekend pager # available in [facility]

## 2020-06-16 ENCOUNTER — Inpatient Hospital Stay (HOSPITAL_COMMUNITY): Payer: Medicare HMO

## 2020-06-16 LAB — GLUCOSE, CAPILLARY
Glucose-Capillary: 75 mg/dL (ref 70–99)
Glucose-Capillary: 135 mg/dL — ABNORMAL HIGH (ref 70–99)
Glucose-Capillary: 181 mg/dL — ABNORMAL HIGH (ref 70–99)
Glucose-Capillary: 139 mg/dL — ABNORMAL HIGH (ref 70–99)
Glucose-Capillary: 166 mg/dL — ABNORMAL HIGH (ref 70–99)
Glucose-Capillary: 202 mg/dL — ABNORMAL HIGH (ref 70–99)
Glucose-Capillary: 135 mg/dL — ABNORMAL HIGH (ref 70–99)
Glucose-Capillary: 149 mg/dL — ABNORMAL HIGH (ref 70–99)
Glucose-Capillary: 174 mg/dL — ABNORMAL HIGH (ref 70–99)
Glucose-Capillary: 213 mg/dL — ABNORMAL HIGH (ref 70–99)
Glucose-Capillary: 174 mg/dL — ABNORMAL HIGH (ref 70–99)
Glucose-Capillary: 225 mg/dL — ABNORMAL HIGH (ref 70–99)
Glucose-Capillary: 245 mg/dL — ABNORMAL HIGH (ref 70–99)

## 2020-06-16 LAB — BASIC METABOLIC PANEL
Anion gap: 11 (ref 5–15)
BUN: 62 mg/dL — ABNORMAL HIGH (ref 8–23)
CO2: 33 mmol/L — ABNORMAL HIGH (ref 22–32)
Calcium: 8.5 mg/dL — ABNORMAL LOW (ref 8.9–10.3)
Chloride: 101 mmol/L (ref 98–111)
Creatinine, Ser: 0.85 mg/dL (ref 0.61–1.24)
GFR calc Af Amer: >60 mL/min (ref >60)
GFR calc non Af Amer: >60 mL/min (ref >60)
Glucose, Bld: 176 mg/dL — ABNORMAL HIGH (ref 70–99)
Potassium: 3.4 mmol/L — ABNORMAL LOW (ref 3.5–5.1)
Sodium: 145 mmol/L (ref 135–145)

## 2020-06-16 LAB — CBC
HCT: 38.6 % — ABNORMAL LOW (ref 39.0–52.0)
Hemoglobin: 12.2 g/dL — ABNORMAL LOW (ref 13.0–17.0)
MCH: 32.1 pg (ref 26.0–34.0)
MCHC: 31.6 g/dL (ref 30.0–36.0)
MCV: 101.6 fL — ABNORMAL HIGH (ref 80.0–100.0)
Platelets: 107 10*3/uL — ABNORMAL LOW (ref 150–400)
RBC: 3.8 MIL/uL — ABNORMAL LOW (ref 4.22–5.81)
RDW: 14.3 % (ref 11.5–15.5)
WBC: 8.2 10*3/uL (ref 4.0–10.5)
nRBC: 0 % (ref 0.0–0.2)

## 2020-06-16 LAB — COMPREHENSIVE METABOLIC PANEL
ALT: 18 U/L (ref 0–44)
AST: 22 U/L (ref 15–41)
Albumin: 2 g/dL — ABNORMAL LOW (ref 3.5–5.0)
Alkaline Phosphatase: 52 U/L (ref 38–126)
Anion gap: 7 (ref 5–15)
BUN: 50 mg/dL — ABNORMAL HIGH (ref 8–23)
CO2: 28 mmol/L (ref 22–32)
Calcium: 6.6 mg/dL — ABNORMAL LOW (ref 8.9–10.3)
Chloride: 111 mmol/L (ref 98–111)
Creatinine, Ser: 0.62 mg/dL (ref 0.61–1.24)
GFR calc Af Amer: >60 mL/min (ref >60)
GFR calc non Af Amer: >60 mL/min (ref >60)
Glucose, Bld: 141 mg/dL — ABNORMAL HIGH (ref 70–99)
Potassium: 2.6 mmol/L — CL (ref 3.5–5.1)
Sodium: 146 mmol/L — ABNORMAL HIGH (ref 135–145)
Total Bilirubin: 0.5 mg/dL (ref 0.3–1.2)
Total Protein: 4.2 g/dL — ABNORMAL LOW (ref 6.5–8.1)

## 2020-06-16 LAB — CBC WITH DIFFERENTIAL/PLATELET
Abs Immature Granulocytes: 0.02 10*3/uL (ref 0.00–0.07)
Basophils Absolute: 0 10*3/uL (ref 0.0–0.1)
Basophils Relative: 0 %
Eosinophils Absolute: 0.1 10*3/uL (ref 0.0–0.5)
Eosinophils Relative: 2 %
HCT: 16.9 % — ABNORMAL LOW (ref 39.0–52.0)
Hemoglobin: 4.9 g/dL — CL (ref 13.0–17.0)
Immature Granulocytes: 1 %
Lymphocytes Relative: 6 %
Lymphs Abs: 0.2 10*3/uL — ABNORMAL LOW (ref 0.7–4.0)
MCH: 32 pg (ref 26.0–34.0)
MCHC: 29 g/dL — ABNORMAL LOW (ref 30.0–36.0)
MCV: 110.5 fL — ABNORMAL HIGH (ref 80.0–100.0)
Monocytes Absolute: 0.2 10*3/uL (ref 0.1–1.0)
Monocytes Relative: 7 %
Neutro Abs: 2.6 10*3/uL (ref 1.7–7.7)
Neutrophils Relative %: 84 %
Platelets: 43 10*3/uL — ABNORMAL LOW (ref 150–400)
RBC: 1.53 MIL/uL — ABNORMAL LOW (ref 4.22–5.81)
RDW: 14.5 % (ref 11.5–15.5)
WBC: 3.1 10*3/uL — ABNORMAL LOW (ref 4.0–10.5)
nRBC: 0 % (ref 0.0–0.2)

## 2020-06-16 MED ORDER — FUROSEMIDE 10 MG/ML IJ SOLN
40.0000 mg | Freq: Four times a day (QID) | INTRAMUSCULAR | Status: AC
  Administered 2020-06-16 (×2): 40 mg via INTRAVENOUS
  Filled 2020-06-16 (×2): qty 4

## 2020-06-16 MED ORDER — POTASSIUM CHLORIDE 20 MEQ/15ML (10%) PO SOLN
40.0000 meq | Freq: Once | ORAL | Status: AC
  Administered 2020-06-16: 40 meq
  Filled 2020-06-16: qty 30

## 2020-06-16 NOTE — Progress Notes (Signed)
[facility] PCCM  Wife updated by phone  [name], MD [place] PCCM Pager: [phone] Cell: [phone] If no response, call [phone]

## 2020-06-16 NOTE — Progress Notes (Signed)
NAME:  Jim Clarke, MRN:  [id number], DOB:  07-22-1941, LOS: 70 ADMISSION DATE:  05/16/2020, CONSULTATION DATE:  7/23 REFERRING MD:  Bonner Puna, CHIEF COMPLAINT:  Dyspnea   Brief History   79 y/o male admitted with COVID pneumonia on 7/17 after having symptoms for about 10 days.  He was treated with heated high flow, actemra, remdesivir and systemic steroids.  PCCM consulted on 7/23 in the setting of worsening agitated delirium and oxygenation.    Past Medical History  Hyeprtension GERD Hyperlipidemia DM2 Arthritis  Significant Hospital Events   7/17 Admission 7/23 Intubation, paralysed 7/24 Started proning 7/25 Off Levophed 7/26 Off paralytics, proning continued 7/27 vomited while prone 7/28-7/30 repeated aspiration in prone position 7/30 enterobacter pneumonia, pressors briefly 8/2 decreasing sedation, stopping oral sedatives 8/4 change to pressure control for synchrony  Consults:  PCCM  Procedures:  7/23 ETT >  7/23 Left subclavian central line > 8/3 Radial A line 7/24 >  8/3 PICC >   Significant Diagnostic Tests:    Micro Data:  7/17 SARS COV 2 > positive 7/17 blood culture >  7/30 resp culture > enterobacter  Antimicrobials:  7/17 actemra > 7/18 7/17 remdesivir > 7/21 7/17 methylprednisolone >   7/31 unasyn> 8/1 8/1 cefepime >   Interim history/subjective:   No acute events  Objective   Blood pressure (!) 134/58, pulse (!) 114, temperature 99.6 F (37.6 C), temperature source Axillary, resp. rate 14, height 5\' 7"  (1.702 m), weight 87.2 kg, SpO2 93 %. CVP:  [4 mmHg-12 mmHg] 7 mmHg  Vent Mode: PRVC FiO2 (%):  [60 %-65 %] 60 % Set Rate:  [30 bmp] 30 bmp Vt Set:  [400 mL] 400 mL PEEP:  [14 cmH20] 14 cmH20 Plateau Pressure:  [25 cmH20-33 cmH20] 25 cmH20   Intake/Output Summary (Last 24 hours) at 06/16/2020 0752 Last data filed at 06/16/2020 0600 Gross per 24 hour  Intake 1756.04 ml  Output 2020 ml  Net -263.96 ml   Filed Weights   06/14/20 0325  06/15/20 0355 06/16/20 0331  Weight: 81.4 kg 86.1 kg 87.2 kg    Examination:  General:  In bed on vent HENT: NCAT ETT in place PULM: CTA B, vent supported breathing CV: RRR, no mgr GI: BS+, soft, nontender MSK: normal bulk and tone Neuro: sedated on vent   8/4 CXR images personally reviewed> bibasilar air space disease unchanged   Resolved Hospital Problem list     Assessment & Plan:  ARDS due to COVID 19 pneumonia, improving oxygenation Continue mechanical ventilation per ARDS protocol Target TVol 6-8cc/kgIBW Target Plateau Pressure < 30cm H20 Target driving pressure less than 15 cm of water Target PaO2 55-65: titrate PEEP/FiO2 per protocol As long as PaO2 to FiO2 ratio is less than 1:150 position in prone position for 16 hours a day Check CVP daily if CVL in place Target CVP less than 4, diurese as necessary Ventilator associated pneumonia prevention protocol 8/4 again hold prone positioning as improving, change to pressure control for better ventilator synchrony, diurese again today  Acute metabolic encephalopathy due to ICU delirium PAD protocol, RASS target -2 to -3 Wean fentanyl/versed infusions per protocol  DM2 with hyperglycemia, complicated by steroids linagliptin SSI Glargine 35 units daily  Aspiration recurrent when in prone position Enterobacter pneumonia Plan cefepime 7 days  Hypokalemia Hypernatremia > improving Monitor BMET and UOP Replace electrolytes as needed (K replaced this morning) Continue free water   Hypertension > soft BP, was on vasopressors through 8/1 Monitor  Best practice:  Diet: tube feeding Pain/Anxiety/Delirium protocol (if indicated): as above VAP protocol (if indicated): yes DVT prophylaxis: lovenox GI prophylaxis: famoditine Glucose control: as above Mobility: bed rest Code Status: full Family Communication: updated his wife by phone on 8/3 Disposition: remain in ICU  Labs   CBC: Recent Labs  Lab  06/10/20 0347 06/10/20 0347 06/11/20 0337 06/12/20 0500 06/13/20 0446 06/14/20 0146 06/15/20 0330 06/15/20 0521 06/16/20 0410  WBC 16.9*   < > 13.5*   < > 9.9 8.3 3.1* 7.7 8.2  NEUTROABS 15.5*  --  11.9*  --   --   --  2.6 6.3  --   HGB 15.0   < > 14.3   < > 13.7 13.4 4.9* 12.6* 12.2*  HCT 47.1   < > 44.7   < > 43.9 43.7 16.9* 41.2 38.6*  MCV 98.3   < > 99.3   < > 101.9* 103.1* 110.5* 103.3* 101.6*  PLT 123*   < > 125*   < > 139* 123* 43* 104* 107*   < > = values in this interval not displayed.    Basic Metabolic Panel: Recent Labs  Lab 06/12/20 0500 06/12/20 0500 06/13/20 0446 06/14/20 0146 06/15/20 0330 06/15/20 0521 06/16/20 0410  NA 147*   < > 151* 153* 146* 148* 145  K 3.6   < > 3.3* 3.4* 2.6* 4.1 3.4*  CL 96*   < > 98 100 111 98 101  CO2 40*   < > 43* 40* 28 37* 33*  GLUCOSE 107*   < > 57* 142* 141* 231* 176*  BUN 67*   < > 68* 62* 50* 72* 62*  CREATININE 0.71   < > 0.90 0.88 0.62 0.97 0.85  CALCIUM 8.7*   < > 8.7* 8.7* 6.6* 8.6* 8.5*  MG 2.3  --  2.1 2.0  --   --   --   PHOS 4.0  --  4.2 3.1  --   --   --    < > = values in this interval not displayed.   GFR: Estimated Creatinine Clearance: 75.5 mL/min (by C-G formula based on SCr of 0.85 mg/dL). Recent Labs  Lab 06/14/20 0146 06/15/20 0330 06/15/20 0521 06/16/20 0410  WBC 8.3 3.1* 7.7 8.2    Liver Function Tests: Recent Labs  Lab 06/10/20 0347 06/11/20 0337 06/15/20 0330 06/15/20 0521  AST 47* 34 22 27  ALT 38 32 18 23  ALKPHOS 71 67 52 64  BILITOT 1.1 0.9 0.5 0.7  PROT 6.4* 5.7* 4.2* 5.5*  ALBUMIN 3.1* 2.7* 2.0* 2.5*   No results for input(s): LIPASE, AMYLASE in the last 168 hours. No results for input(s): AMMONIA in the last 168 hours.  ABG    Component Value Date/Time   PHART 7.430 06/13/2020 2200   PCO2ART 67.1 (HH) 06/13/2020 2200   PO2ART 72.4 (L) 06/13/2020 2200   HCO3 43.9 (H) 06/13/2020 2200   ACIDBASEDEF 0.4 06/06/2020 1100   O2SAT 93.8 06/13/2020 2200     Coagulation  Profile: No results for input(s): INR, PROTIME in the last 168 hours.  Cardiac Enzymes: No results for input(s): CKTOTAL, CKMB, CKMBINDEX, TROPONINI in the last 168 hours.  HbA1C: Hemoglobin A1C  Date/Time Value Ref Range Status  03/31/2020 10:39 AM 7.2 (A) 4.0 - 5.6 % Final  03/04/2018 10:54 AM 7.8  Final   Hgb A1c MFr Bld  Date/Time Value Ref Range Status  05/31/2020 10:49 AM 7.5 (H) 4.8 - 5.6 % Final  Comment:    (NOTE) Pre diabetes:          5.7%-6.4%  Diabetes:              >6.4%  Glycemic control for   <7.0% adults with diabetes     CBG: Recent Labs  Lab 06/13/20 2328 06/14/20 0320 06/14/20 0742 06/14/20 2343 06/15/20 0811  GLUCAP 173* 143* 109* 192* 214*     Critical care time: 35 minutes     [name], MD [place] P[facility]M Pager: [phone] Cell: [phone] If no response, call [phone]

## 2020-06-16 NOTE — Progress Notes (Signed)
Concern for inaccurate labs. Will re-collect

## 2020-06-16 NOTE — Progress Notes (Signed)
E-link notified of critical labs: hgb 4.9 & K 2.6. Concern for diluted/inaccurate sample. Labs recollected. Will monitor closely

## 2020-06-16 NOTE — Progress Notes (Signed)
[facility] ADULT ICU REPLACEMENT PROTOCOL   The patient does apply for the [facility] Adult ICU Electrolyte Replacment Protocol based on the criteria listed below:   1. Is GFR >/= 30 ml/min? Yes.    Patient's GFR today is >60 2. Is SCr </= 2? Yes.   Patient's SCr is 0.85 ml/kg/hr 3. Did SCr increase >/= 0.5 in 24 hours? no 4. Abnormal electrolyte(s): k 3.4 5. Ordered repletion with: protocol 6. If a panic level lab has been reported, has the CCM MD in charge been notified? No..   Physician:    Ronda Fairly A 06/16/2020 6:29 AM

## 2020-06-17 ENCOUNTER — Inpatient Hospital Stay (HOSPITAL_COMMUNITY): Payer: Medicare HMO

## 2020-06-17 ENCOUNTER — Encounter (HOSPITAL_COMMUNITY): Payer: Self-pay | Admitting: Internal Medicine

## 2020-06-17 LAB — CBC WITH DIFFERENTIAL/PLATELET
Abs Immature Granulocytes: 0.07 10*3/uL (ref 0.00–0.07)
Basophils Absolute: 0 10*3/uL (ref 0.0–0.1)
Basophils Relative: 0 %
Eosinophils Absolute: 0.1 10*3/uL (ref 0.0–0.5)
Eosinophils Relative: 1 %
HCT: 36.9 % — ABNORMAL LOW (ref 39.0–52.0)
Hemoglobin: 11.7 g/dL — ABNORMAL LOW (ref 13.0–17.0)
Immature Granulocytes: 1 %
Lymphocytes Relative: 4 %
Lymphs Abs: 0.4 10*3/uL — ABNORMAL LOW (ref 0.7–4.0)
MCH: 32.1 pg (ref 26.0–34.0)
MCHC: 31.7 g/dL (ref 30.0–36.0)
MCV: 101.1 fL — ABNORMAL HIGH (ref 80.0–100.0)
Monocytes Absolute: 0.4 10*3/uL (ref 0.1–1.0)
Monocytes Relative: 4 %
Neutro Abs: 8.7 10*3/uL — ABNORMAL HIGH (ref 1.7–7.7)
Neutrophils Relative %: 90 %
Platelets: 94 10*3/uL — ABNORMAL LOW (ref 150–400)
RBC: 3.65 MIL/uL — ABNORMAL LOW (ref 4.22–5.81)
RDW: 14.5 % (ref 11.5–15.5)
WBC: 9.7 10*3/uL (ref 4.0–10.5)
nRBC: 0 % (ref 0.0–0.2)

## 2020-06-17 LAB — COMPREHENSIVE METABOLIC PANEL
ALT: 19 U/L (ref 0–44)
AST: 29 U/L (ref 15–41)
Albumin: 2.3 g/dL — ABNORMAL LOW (ref 3.5–5.0)
Alkaline Phosphatase: 66 U/L (ref 38–126)
Anion gap: 8 (ref 5–15)
BUN: 50 mg/dL — ABNORMAL HIGH (ref 8–23)
CO2: 31 mmol/L (ref 22–32)
Calcium: 7.3 mg/dL — ABNORMAL LOW (ref 8.9–10.3)
Chloride: 100 mmol/L (ref 98–111)
Creatinine, Ser: 0.9 mg/dL (ref 0.61–1.24)
GFR calc Af Amer: >60 mL/min (ref >60)
GFR calc non Af Amer: >60 mL/min (ref >60)
Glucose, Bld: 276 mg/dL — ABNORMAL HIGH (ref 70–99)
Potassium: 3.3 mmol/L — ABNORMAL LOW (ref 3.5–5.1)
Sodium: 139 mmol/L (ref 135–145)
Total Bilirubin: 0.6 mg/dL (ref 0.3–1.2)
Total Protein: 4.9 g/dL — ABNORMAL LOW (ref 6.5–8.1)

## 2020-06-17 LAB — GLUCOSE, CAPILLARY
Glucose-Capillary: 249 mg/dL — ABNORMAL HIGH (ref 70–99)
Glucose-Capillary: 224 mg/dL — ABNORMAL HIGH (ref 70–99)
Glucose-Capillary: 278 mg/dL — ABNORMAL HIGH (ref 70–99)
Glucose-Capillary: 292 mg/dL — ABNORMAL HIGH (ref 70–99)
Glucose-Capillary: 319 mg/dL — ABNORMAL HIGH (ref 70–99)

## 2020-06-17 MED ORDER — DOCUSATE SODIUM 50 MG/5ML PO LIQD
100.0000 mg | Freq: Two times a day (BID) | ORAL | Status: DC
  Filled 2020-06-17 (×2): qty 10

## 2020-06-17 MED ORDER — FUROSEMIDE 10 MG/ML IJ SOLN
40.0000 mg | Freq: Four times a day (QID) | INTRAMUSCULAR | Status: AC
  Administered 2020-06-17 (×2): 40 mg via INTRAVENOUS
  Filled 2020-06-17 (×2): qty 4

## 2020-06-17 MED ORDER — CALCIUM GLUCONATE-NACL 1-0.675 GM/50ML-% IV SOLN
1.0000 g | Freq: Once | INTRAVENOUS | Status: AC
  Administered 2020-06-17: 1000 mg via INTRAVENOUS
  Filled 2020-06-17: qty 50

## 2020-06-17 MED ORDER — POLYETHYLENE GLYCOL 3350 17 G PO PACK: 17.0000 g | PACK | Freq: Every day | ORAL | Status: DC

## 2020-06-17 MED ORDER — ENOXAPARIN SODIUM 40 MG/0.4ML ~~LOC~~ SOLN
40.0000 mg | SUBCUTANEOUS | Status: DC
  Administered 2020-06-17 – 2020-06-21 (×5): 40 mg via SUBCUTANEOUS
  Filled 2020-06-17 (×5): qty 0.4

## 2020-06-17 MED ORDER — DEXMEDETOMIDINE HCL IN NACL 200 MCG/50ML IV SOLN
0.0000 ug/kg/h | INTRAVENOUS | Status: DC
  Administered 2020-06-17: 1 ug/kg/h via INTRAVENOUS
  Administered 2020-06-17: 0.4 ug/kg/h via INTRAVENOUS
  Administered 2020-06-17 – 2020-06-18 (×2): 1 ug/kg/h via INTRAVENOUS
  Administered 2020-06-18: 1.1 ug/kg/h via INTRAVENOUS
  Administered 2020-06-18: 1.2 ug/kg/h via INTRAVENOUS
  Filled 2020-06-17 (×2): qty 50
  Filled 2020-06-17: qty 100
  Filled 2020-06-17 (×3): qty 50
  Filled 2020-06-17: qty 100

## 2020-06-17 MED ORDER — INSULIN ASPART 100 UNIT/ML ~~LOC~~ SOLN
5.0000 [IU] | SUBCUTANEOUS | Status: DC
  Administered 2020-06-17 – 2020-06-20 (×19): 5 [IU] via SUBCUTANEOUS

## 2020-06-17 MED ORDER — POTASSIUM CHLORIDE 10 MEQ/100ML IV SOLN
10.0000 meq | INTRAVENOUS | Status: AC
  Administered 2020-06-17 (×4): 10 meq via INTRAVENOUS
  Filled 2020-06-17 (×2): qty 100

## 2020-06-17 MED ORDER — POTASSIUM CHLORIDE 20 MEQ/15ML (10%) PO SOLN
40.0000 meq | Freq: Once | ORAL | Status: AC
  Administered 2020-06-17: 40 meq
  Filled 2020-06-17: qty 30

## 2020-06-17 NOTE — Progress Notes (Signed)
Patient transported to CT without complication.  

## 2020-06-17 NOTE — TOC Progression Note (Signed)
Transition of Care [facility]) - Progression Note    Patient Details  Name: Jim Clarke MRN: [id number] Date of Birth: 1941-03-07  [facility]) CM/SW Contact  Leeroy Cha, RN Phone Number: 06/17/2020, 8:25 AM  Clinical Narrative:    Per the chart: [place] Hospital Events   7/17 Admission 7/23 Intubation, paralysed 7/24 Started proning 7/25 Off Levophed 7/26 Off paralytics, proning continued 7/27 vomited while prone 7/28-7/30 repeated aspiration in prone position 7/30 enterobacter pneumonia, pressors briefly 8/2 decreasing sedation, stopping oral sedatives 8/4 change to pressure control for synchrony Following for progression and toc needs.   Expected Discharge Plan: Home/Self Care Barriers to Discharge: Continued Medical Work up  Expected Discharge Plan and Services Expected Discharge Plan: Home/Self Care   Discharge Planning Services: CM Consult   Living arrangements for the past 2 months: Single Family Home                                       Social Determinants of Health (SDOH) Interventions    Readmission Risk Interventions No flowsheet data found.

## 2020-06-17 NOTE — Progress Notes (Signed)
NAME:  Jim Clarke, MRN:  [id number], DOB:  1941/08/25, LOS: 26 ADMISSION DATE:  06/05/2020, CONSULTATION DATE:  7/23 REFERRING MD:  Bonner Puna, CHIEF COMPLAINT:  Dyspnea   Brief History   79 y/o male admitted with COVID pneumonia on 7/17 after having symptoms for about 10 days.  He was treated with heated high flow, actemra, remdesivir and systemic steroids.  PCCM consulted on 7/23 in the setting of worsening agitated delirium and oxygenation.    Past Medical History  Hyeprtension GERD Hyperlipidemia DM2 Arthritis  Significant Hospital Events   7/17 Admission 7/23 Intubation, paralysed 7/24 Started proning 7/25 Off Levophed 7/26 Off paralytics, proning continued 7/27 vomited while prone 7/28-7/30 repeated aspiration in prone position 7/30 enterobacter pneumonia, pressors briefly 8/2 decreasing sedation, stopping oral sedatives 8/4 change to pressure control for synchrony 8/5: Tolerating pressure support ventilation.  Continuing diuresis, transitioning to Precedex infusion Consults:  PCCM  Procedures:  7/23 ETT >  7/23 Left subclavian central line > 8/3 Radial A line 7/24 >  8/3 PICC >   Significant Diagnostic Tests:    Micro Data:  7/17 SARS COV 2 > positive 7/17 blood culture >  7/30 resp culture > enterobacter  Antimicrobials:  7/17 actemra > 7/18 7/17 remdesivir > 7/21 7/17 methylprednisolone > (completed)   7/31 unasyn> 8/1 8/1 cefepime >   Interim history/subjective:   Currently on pressure support ventilation.  Appears comfortable  Objective   Blood pressure (Abnormal) 105/45, pulse (Abnormal) 110, temperature 99.7 F (37.6 C), temperature source Axillary, resp. rate (Abnormal) 23, height 5\' 7"  (1.702 m), weight 87.2 kg, SpO2 91 %. CVP:  [0 mmHg-9 mmHg] 9 mmHg  Vent Mode: PRVC FiO2 (%):  [60 %-70 %] 70 % Set Rate:  [18 bmp-30 bmp] 18 bmp Vt Set:  [400 mL-520 mL] 520 mL PEEP:  [14 cmH20] 14 cmH20 Plateau Pressure:  [24 cmH20-34 cmH20] 24 cmH20    Intake/Output Summary (Last 24 hours) at 06/17/2020 0827 Last data filed at 06/17/2020 7096 Gross per 24 hour  Intake 3099.32 ml  Output 3975 ml  Net -875.68 ml   Filed Weights   06/14/20 0325 06/15/20 0355 06/16/20 0331  Weight: 81.4 kg 86.1 kg 87.2 kg    Examination:  General this is a 79 year old white male he is currently heavily sedated on Versed and fentanyl infusion remains on full ventilatory support, however tolerating spontaneous efforts HEENT normocephalic atraumatic orally intubated no JVD sclera nonicteric mucous membranes are moist Pulmonary: Equal chest rise bilaterally no accessory use on pressure support ventilation diminished both bases Pplat 29/ driving pressure 14, fio2 still at 50%/peep still 14; sats 89%; transitioned to PSV 8/peep 10; Vts 600s; rr 17 pulse ox actually improved > 90% Cardiac regular rate and rhythm no murmur rub or gallop Abdomen soft not tender continues to have liquid stool via Flexi-Seal tolerating tube feeds otherwise Extremities are warm, dry, has brisk capillary refill and strong pulses Neuro currently heavily sedated GU clear yellow urine  Resolved Hospital Problem list     Assessment & Plan:   Acute hypoxic respiratory failure w/ ARDS due to COVID 19 pneumonia, Oxygenation requirements stable 1.7 liters neg tmax 99.9 w/out sig spike; wbc ct flat Pcxr: ett and line good position. Stable bilateral airspace disease. Left worse than right. Really looks about same as yesterday Plan Cont PSV as tolerated; change to PCV at HS for rest mode for vent synchrony Driving pressure goal < 15 pplat goal < 30 VT goal 6-8 ml/kg (his lung  mechanics suggest he is getting much more compliant)  Time to change RASS goal to facilitate weaning efforts. Will change to precedex, dc versed, cont fent for now Cont lasix (still has move volume to give) Pulse ox goal > 88 Am cxr VAP bundle  No need for prone position at this point  Aspiration/HCAP:  Sputum growing Enterobacter -Aspirated during prone positioning Plan Day #5 of 7 cefepime  Acute metabolic encephalopathy due to ICU delirium Plan Discontinue Versed Initiate Precedex, continue fentanyl infusion for now Hopefully change fentanyl to as needed on 8/6 Continue PAD protocol, however transitioning RASS goal to 0 to -1   DM2 with hyperglycemia, complicated by steroids: Remains hyperglycemic Plan Continue sliding scale insulin every 4 hours Continue Lantus 35 units nightly Continue Tradjenta via tube daily We will add scheduled aspart every 4 hours for tube feed coverage   Fluid and electrolyte imbalance: Hypokalemia, hypernatremia -Hypernatremia has improved/normalized Plan Replace potassium Continue free water replacement given  active diuresis     Best practice:  Diet: tube feeding Pain/Anxiety/Delirium protocol (if indicated): as above VAP protocol (if indicated): yes DVT prophylaxis: lovenox GI prophylaxis: famoditine Glucose control: as above Mobility: bed rest Code Status: full Family Communication: updated his wife by phone  Daily  Disposition: remain in ICU  Labs   CBC: Recent Labs  Lab 06/11/20 0337 06/12/20 0500 06/14/20 0146 06/15/20 0330 06/15/20 0521 06/16/20 0410 06/17/20 0405  WBC 13.5*   < > 8.3 3.1* 7.7 8.2 9.7  NEUTROABS 11.9*  --   --  2.6 6.3  --  8.7*  HGB 14.3   < > 13.4 4.9* 12.6* 12.2* 11.7*  HCT 44.7   < > 43.7 16.9* 41.2 38.6* 36.9*  MCV 99.3   < > 103.1* 110.5* 103.3* 101.6* 101.1*  PLT 125*   < > 123* 43* 104* 107* 94*   < > = values in this interval not displayed.    Basic Metabolic Panel: Recent Labs  Lab 06/12/20 0500 06/12/20 0500 06/13/20 0446 06/13/20 0446 06/14/20 0146 06/15/20 0330 06/15/20 0521 06/16/20 0410 06/17/20 0405  NA 147*   < > 151*   < > 153* 146* 148* 145 139  K 3.6   < > 3.3*   < > 3.4* 2.6* 4.1 3.4* 3.3*  CL 96*   < > 98   < > 100 111 98 101 100  CO2 40*   < > 43*   < > 40* 28 37*  33* 31  GLUCOSE 107*   < > 57*   < > 142* 141* 231* 176* 276*  BUN 67*   < > 68*   < > 62* 50* 72* 62* 50*  CREATININE 0.71   < > 0.90   < > 0.88 0.62 0.97 0.85 0.90  CALCIUM 8.7*   < > 8.7*   < > 8.7* 6.6* 8.6* 8.5* 7.3*  MG 2.3  --  2.1  --  2.0  --   --   --   --   PHOS 4.0  --  4.2  --  3.1  --   --   --   --    < > = values in this interval not displayed.   GFR: Estimated Creatinine Clearance: 71.3 mL/min (by C-G formula based on SCr of 0.9 mg/dL). Recent Labs  Lab 06/15/20 0330 06/15/20 0521 06/16/20 0410 06/17/20 0405  WBC 3.1* 7.7 8.2 9.7    Liver Function Tests: Recent Labs  Lab 06/11/20 0337 06/15/20 0330  06/15/20 0521 06/17/20 0405  AST 34 22 27 29   ALT 32 18 23 19   ALKPHOS 67 52 64 66  BILITOT 0.9 0.5 0.7 0.6  PROT 5.7* 4.2* 5.5* 4.9*  ALBUMIN 2.7* 2.0* 2.5* 2.3*   No results for input(s): LIPASE, AMYLASE in the last 168 hours. No results for input(s): AMMONIA in the last 168 hours.  ABG    Component Value Date/Time   PHART 7.430 06/13/2020 2200   PCO2ART 67.1 (HH) 06/13/2020 2200   PO2ART 72.4 (L) 06/13/2020 2200   HCO3 43.9 (H) 06/13/2020 2200   ACIDBASEDEF 0.4 06/06/2020 1100   O2SAT 93.8 06/13/2020 2200     Coagulation Profile: No results for input(s): INR, PROTIME in the last 168 hours.  Cardiac Enzymes: No results for input(s): CKTOTAL, CKMB, CKMBINDEX, TROPONINI in the last 168 hours.  HbA1C: Hemoglobin A1C  Date/Time Value Ref Range Status  03/31/2020 10:39 AM 7.2 (A) 4.0 - 5.6 % Final  03/04/2018 10:54 AM 7.8  Final   Hgb A1c MFr Bld  Date/Time Value Ref Range Status  05/31/2020 10:49 AM 7.5 (H) 4.8 - 5.6 % Final    Comment:    (NOTE) Pre diabetes:          5.7%-6.4%  Diabetes:              >6.4%  Glycemic control for   <7.0% adults with diabetes     CBG: Recent Labs  Lab 06/15/20 2004 06/15/20 2304 06/16/20 0320 06/16/20 0847 06/16/20 1203  GLUCAP 174* 213* 174* 225* 245*     Critical care time: 36 min      Erick Colace ACNP-BC [place] Pager # [phone] OR # [phone] if no answer

## 2020-06-17 NOTE — Progress Notes (Signed)
Patient LEFT pupil not reactive to light, CCM MD McQuaid notified. STAT Head CT ordered.

## 2020-06-17 NOTE — Progress Notes (Signed)
[facility] PCCM  Per nursing, L pupil not reactive He has been minimally responsive for several days, presumably sedation related  Plan: Stat head CT  [name], MD [place] PCCM Pager: [phone] Cell: [phone] If no response, call [phone]

## 2020-06-17 NOTE — Progress Notes (Signed)
Patient vent mode changed to PS 8 (Above PEEP)/+10/50% by PCCM NP. Patient vitals stable at this time and no increased WOB noted. Vt - 672 , RR - 16 tot, and Ve - 9.5. RT will continue to monitor.

## 2020-06-17 NOTE — Progress Notes (Signed)
Patient returned to [facility] mode due to increased WOB, elevated BP, and increased RR. ETT holder changed at this time as well. RR down to 23 and BP improved after mode change. RT will continue to monitor patient.

## 2020-06-17 NOTE — Progress Notes (Signed)
[place] Progress Note Patient Name: Jim Clarke DOB: June 20, 1941 MRN: [id number]   Date of Service  06/17/2020  HPI/Events of Note  K and calcium are low (corrected level as well)  eICU Interventions  One time repletion ordered     Intervention Category Intermediate Interventions: Electrolyte abnormality - evaluation and management  [name] 06/17/2020, 7:00 AM

## 2020-06-17 NOTE — Progress Notes (Deleted)
This note also relates to the following rows which could not be included: Pulse Rate - Cannot attach notes to unvalidated device data ECG Heart Rate - Cannot attach notes to unvalidated device data Resp - Cannot attach notes to unvalidated device data SpO2 - Cannot attach notes to unvalidated device data Arterial Line BP - Cannot attach notes to unvalidated device data Arterial Line MAP (mmHg) - Cannot attach notes to unvalidated device data

## 2020-06-18 ENCOUNTER — Inpatient Hospital Stay (HOSPITAL_COMMUNITY): Payer: Medicare HMO

## 2020-06-18 DIAGNOSIS — G9340 Encephalopathy, unspecified: Secondary | ICD-10-CM

## 2020-06-18 LAB — COMPREHENSIVE METABOLIC PANEL
ALT: 23 U/L (ref 0–44)
AST: 30 U/L (ref 15–41)
Albumin: 2.3 g/dL — ABNORMAL LOW (ref 3.5–5.0)
Alkaline Phosphatase: 73 U/L (ref 38–126)
Anion gap: 9 (ref 5–15)
BUN: 55 mg/dL — ABNORMAL HIGH (ref 8–23)
CO2: 29 mmol/L (ref 22–32)
Calcium: 7.7 mg/dL — ABNORMAL LOW (ref 8.9–10.3)
Chloride: 100 mmol/L (ref 98–111)
Creatinine, Ser: 0.97 mg/dL (ref 0.61–1.24)
GFR calc Af Amer: >60 mL/min (ref >60)
GFR calc non Af Amer: >60 mL/min (ref >60)
Glucose, Bld: 237 mg/dL — ABNORMAL HIGH (ref 70–99)
Potassium: 3.5 mmol/L (ref 3.5–5.1)
Sodium: 138 mmol/L (ref 135–145)
Total Bilirubin: 0.6 mg/dL (ref 0.3–1.2)
Total Protein: 5.4 g/dL — ABNORMAL LOW (ref 6.5–8.1)

## 2020-06-18 LAB — BLOOD GAS, ARTERIAL
Acid-Base Excess: 7 mmol/L — ABNORMAL HIGH (ref 0.0–2.0)
Bicarbonate: 30.5 mmol/L — ABNORMAL HIGH (ref 20.0–28.0)
Drawn by: 44126
FIO2: 60
O2 Saturation: 93.2 %
PEEP: 10 cmH2O
PIP: 18 cmH2O
Patient temperature: 98.6
RATE: 16 resp/min
pCO2 arterial: 40 mmHg (ref 32.0–48.0)
pH, Arterial: 7.495 — ABNORMAL HIGH (ref 7.350–7.450)
pO2, Arterial: 63.1 mmHg — ABNORMAL LOW (ref 83.0–108.0)

## 2020-06-18 LAB — CBC
HCT: 38.1 % — ABNORMAL LOW (ref 39.0–52.0)
Hemoglobin: 12.7 g/dL — ABNORMAL LOW (ref 13.0–17.0)
MCH: 32.3 pg (ref 26.0–34.0)
MCHC: 33.3 g/dL (ref 30.0–36.0)
MCV: 96.9 fL (ref 80.0–100.0)
Platelets: 99 10*3/uL — ABNORMAL LOW (ref 150–400)
RBC: 3.93 MIL/uL — ABNORMAL LOW (ref 4.22–5.81)
RDW: 14.3 % (ref 11.5–15.5)
WBC: 9.2 10*3/uL (ref 4.0–10.5)
nRBC: 0 % (ref 0.0–0.2)

## 2020-06-18 LAB — GLUCOSE, CAPILLARY
Glucose-Capillary: 144 mg/dL — ABNORMAL HIGH (ref 70–99)
Glucose-Capillary: 194 mg/dL — ABNORMAL HIGH (ref 70–99)
Glucose-Capillary: 208 mg/dL — ABNORMAL HIGH (ref 70–99)
Glucose-Capillary: 241 mg/dL — ABNORMAL HIGH (ref 70–99)
Glucose-Capillary: 277 mg/dL — ABNORMAL HIGH (ref 70–99)
Glucose-Capillary: 301 mg/dL — ABNORMAL HIGH (ref 70–99)

## 2020-06-18 MED ORDER — POTASSIUM CHLORIDE 20 MEQ/15ML (10%) PO SOLN
40.0000 meq | Freq: Once | ORAL | Status: AC
  Administered 2020-06-18: 40 meq
  Filled 2020-06-18: qty 30

## 2020-06-18 MED ORDER — LORAZEPAM 2 MG/ML IJ SOLN: 1.0000 mg | Freq: Three times a day (TID) | INTRAMUSCULAR | Status: DC | PRN

## 2020-06-18 MED ORDER — LORAZEPAM 2 MG/ML IJ SOLN
1.0000 mg | Freq: Four times a day (QID) | INTRAMUSCULAR | Status: AC
  Administered 2020-06-20 – 2020-06-21 (×4): 1 mg via INTRAVENOUS
  Filled 2020-06-18 (×4): qty 1

## 2020-06-18 MED ORDER — LORAZEPAM 2 MG/ML IJ SOLN
1.0000 mg | Freq: Three times a day (TID) | INTRAMUSCULAR | Status: AC
  Administered 2020-06-21 – 2020-06-22 (×3): 1 mg via INTRAVENOUS
  Filled 2020-06-18 (×4): qty 1

## 2020-06-18 MED ORDER — DEXMEDETOMIDINE HCL IN NACL 400 MCG/100ML IV SOLN
0.0000 ug/kg/h | INTRAVENOUS | Status: AC
  Administered 2020-06-18 (×4): 1.4 ug/kg/h via INTRAVENOUS
  Administered 2020-06-18 (×2): 1.2 ug/kg/h via INTRAVENOUS
  Administered 2020-06-19: 1.399 ug/kg/h via INTRAVENOUS
  Administered 2020-06-19 – 2020-06-21 (×13): 1.4 ug/kg/h via INTRAVENOUS
  Filled 2020-06-18 (×2): qty 100
  Filled 2020-06-18: qty 200
  Filled 2020-06-18 (×4): qty 100
  Filled 2020-06-18: qty 200
  Filled 2020-06-18 (×12): qty 100

## 2020-06-18 MED ORDER — LORAZEPAM 2 MG/ML IJ SOLN
2.0000 mg | INTRAMUSCULAR | Status: AC
  Administered 2020-06-18 – 2020-06-19 (×6): 2 mg via INTRAVENOUS
  Filled 2020-06-18 (×6): qty 1

## 2020-06-18 MED ORDER — PIVOT 1.5 CAL PO LIQD
1000.0000 mL | ORAL | Status: DC
  Administered 2020-06-18 – 2020-06-21 (×3): 1000 mL
  Filled 2020-06-18 (×6): qty 1000

## 2020-06-18 MED ORDER — QUETIAPINE FUMARATE 50 MG PO TABS
50.0000 mg | ORAL_TABLET | Freq: Two times a day (BID) | ORAL | Status: DC
  Administered 2020-06-18 – 2020-06-20 (×6): 50 mg
  Filled 2020-06-18 (×6): qty 1

## 2020-06-18 MED ORDER — LORAZEPAM 2 MG/ML IJ SOLN
2.0000 mg | Freq: Four times a day (QID) | INTRAMUSCULAR | Status: AC
  Administered 2020-06-19 – 2020-06-20 (×4): 2 mg via INTRAVENOUS
  Filled 2020-06-18 (×4): qty 1

## 2020-06-18 NOTE — Progress Notes (Signed)
Nutrition Follow-up  DOCUMENTATION CODES:   Non-severe (moderate) malnutrition in context of acute illness/injury  INTERVENTION:  - will increase TF regimen to Pivot 1.5 @ 60 ml/hr with 45 ml Prosource TF once/day which will provide 2200 kcal, 146 grams protein, and 1093 ml free water.  - free water flush to continue to be per CCM.   NUTRITION DIAGNOSIS:   Moderate Malnutrition related to acute illness, catabolic illness (CNOBS-96 infection) as evidenced by mild fat depletion, mild muscle depletion, moderate muscle depletion. -ongoing  GOAL:   Patient will meet greater than or equal to 90% of their needs -met with TF regimen  MONITOR:   Vent status, TF tolerance, Labs, Weight trends, Skin  ASSESSMENT:   79 y.o. male with medical history of type 2 DM on oral hyperglycemic meds, former tobacco abuse, and obesity. He presented to the ED from monoclonal infusion clinic with hypoxia and SOB. He was vaccinated in 03/2020 and has been exposed to unvaccinated wife who is currently recovering from [place]. He called PCP on 7/15 for generalized malaise, fevers, coughing, SOB and was subsequently referred for outpatient monoclonal infusion scheduled for 7/17. While at infusion center, patient noted to have O2 sats into the 70% requiring up 6L on initial presentation.  Significant events: 7/17: admitted for PNA from COVID-19 infection 7/19- initial RD assessment 7/23: intubated early morning d/t ARDS; TF initiation  7/27- aspiration event following vomiting TF while proned overnight 7/28- NFPE completed indicating malnutrition 7/31- proned; vomited TF and OGT placed to LIS (see RN note at [facility]); TF resumed during day shift when supine 8/1- proned overnight  8/4- OGT removed and replaced with small bore tube (distal stomach/proximal duodenum)   Patient remains intubated in supine position. NGT in L nare and he is receiving TF at goal: Pivot 1.5 @ 55 ml/hr with 45 ml Prosource TF once/day and 300  ml free water every 3 hours. This regimen is providing 2020 kcal, 135 grams protein, and 3402 ml free water. RN reports patient tolerating TF regimen without issue.   Weight has been fluctuating throughout admission. Weight today is up and now consistent with admission weight. Moderate pitting edema to BUE and mild edema to BLE noted in flow sheet.     Patient is currently intubated on ventilator support MV: 17.4 L/min Temp (24hrs), Avg:99.3 F (37.4 C), Min:97.9 F (36.6 C), Max:100.7 F (38.2 C) Propofol: none  Labs reviewed; CBGs: 301, 241, 144 mg/dl, BUN: 55 mg/dl, Ca: 7.7 mg/dl. Medications reviewed; 500 mg ascorbic acid/day, 100 mg colace BID, 40 mg IV lasix x2 doses 8/5, sliding scale novolog, 5 units novolog every 4 hours, 35 units lantus/day, 1 tablet multivitamin with minerals/day, 17 g miralax/day, 10 mEq IV KCl x4 runs 8/5, 40 mEq KCl per NGT x1 dose 8/6, 220 mg zinc sulfate/day. Drips; precedex @ 1.4 mcg/kg/hr, fentanyl @ 150 mcg/hr   Diet Order:   Diet Order    None      EDUCATION NEEDS:   No education needs have been identified at this time  Skin:  Skin Assessment: Skin Integrity Issues: Skin Integrity Issues:: Unstageable Unstageable: full thickness to R nare (new doc 7/26)  Last BM:  8/6 (type 7 x1)  Height:   Ht Readings from Last 1 Encounters:  06/16/20 '5\' 7"'$  (1.702 m)    Weight:   Wt Readings from Last 1 Encounters:  06/16/20 87.2 kg     Estimated Nutritional Needs:  Kcal:  2836-6294 kcal (24-26 kcal/kg) Protein:  125-150 grams (  1.5-1.8 grams/kg) Fluid:  >/= 2.1 L/day     Jarome Matin, MS, RD, LDN, CNSC Inpatient Clinical Dietitian RD pager # available in [facility]  After hours/weekend pager # available in [facility]

## 2020-06-18 NOTE — Progress Notes (Signed)
Inpatient Diabetes Program Recommendations  AACE/ADA: New Consensus Statement on Inpatient Glycemic Control (2015)  Target Ranges:  Prepandial:   less than 140 mg/dL      Peak postprandial:   less than 180 mg/dL (1-2 hours)      Critically ill patients:  140 - 180 mg/dL   Lab Results  Component Value Date   GLUCAP 144 (H) 06/18/2020   HGBA1C 7.5 (H) 05/31/2020    Review of Glycemic Control Results for Jim Clarke, Jim Clarke (MRN [id number]) as of 06/18/2020 13:20  Ref. Range 06/17/2020 16:05 06/17/2020 19:49 06/18/2020 00:23 06/18/2020 03:54 06/18/2020 08:30  Glucose-Capillary Latest Ref Range: 70 - 99 mg/dL 277 (H) 208 (H) 301 (H) 241 (H) 144 (H)   Diabetes history: DM 2 Outpatient Diabetes medications: metformin 1000 mg bid, Jardiance 10 mg QD, glipizide 10 mg bid Current orders for Inpatient glycemic control:  Lantus 35 units qhs Novolog 0-15 units Q4 hours Novolog 5 units Tube Feed Coverage Q4 hours Tradjenta 5 mg Daily  Pivot 1.5 Cal 40 ml/hour  Inpatient Diabetes Program Recommendations:    Glucose increased between 200-250 at lunchtime glucose check based on amount of Novolog Coverage given in [facility]  -  Consider increasing Novolog Tube Feed Coverage to 8 units Q4 hours.  Thanks,  Tama Headings RN, MSN, BC-ADM Inpatient Diabetes Coordinator Team Pager [phone] (8a-5p)

## 2020-06-18 NOTE — Progress Notes (Addendum)
NAME:  Jim Clarke, MRN:  [id number], DOB:  08/30/1941, LOS: 38 ADMISSION DATE:  05/31/2020, CONSULTATION DATE:  7/23 REFERRING MD:  Bonner Puna, CHIEF COMPLAINT:  Dyspnea   Brief History   79 y/o male admitted with COVID pneumonia on 7/17 after having symptoms for about 10 days.  He was treated with heated high flow, actemra, remdesivir and systemic steroids.  PCCM consulted on 7/23 in the setting of worsening agitated delirium and oxygenation.    Past Medical History  Hyeprtension GERD Hyperlipidemia DM2 Arthritis  Significant Hospital Events   7/17 Admission 7/23 Intubation, paralysed 7/24 Started proning 7/25 Off Levophed 7/26 Off paralytics, proning continued 7/27 vomited while prone 7/28-7/30 repeated aspiration in prone position 7/30 enterobacter pneumonia, pressors briefly 8/2 decreasing sedation, stopping oral sedatives 8/4 change to pressure control for synchrony 8/5: Tolerating pressure support ventilation.  Continuing diuresis, transitioning to Precedex infusion 8/5 nursing staff noting left pupil not reactive. CT head negative  8/6: Tmax 99.3; wbc stable. I&O -1.6 respiratory rate increased, increased work of breathing, requiring increased FiO2 with air trapping and auto PEEP, suspecting having some difficulty transitioning off benzodiazepines, adding Ativan taper Consults:  PCCM  Procedures:  7/23 ETT >  7/23 Left subclavian central line > 8/3 Radial A line 7/24 >  8/3 PICC >   Significant Diagnostic Tests:    Micro Data:  7/17 SARS COV 2 > positive 7/17 blood culture >  7/30 resp culture > enterobacter  Antimicrobials:  7/17 actemra > 7/18 7/17 remdesivir > 7/21 7/17 methylprednisolone > (completed)   7/31 unasyn> 8/1 8/1 cefepime >   Interim history/subjective:   He is tachypneic today, having increased accessory use.  Increased FiO2 requirements.  Currently on pressure control ventilation  Objective   Blood pressure (Abnormal) 80/38, pulse 94,  temperature 98.9 F (37.2 C), temperature source Axillary, resp. rate (Abnormal) 24, height 5\' 7"  (1.702 m), weight 87.2 kg, SpO2 91 %. CVP:  [5 mmHg-17 mmHg] 5 mmHg  Vent Mode: PCV FiO2 (%):  [45 %-60 %] 60 % Set Rate:  [16 bmp] 16 bmp PEEP:  [10 cmH20] 10 cmH20 Pressure Support:  [8 cmH20] 8 cmH20 Plateau Pressure:  [25 cmH20-30 cmH20] 25 cmH20   Intake/Output Summary (Last 24 hours) at 06/18/2020 0824 Last data filed at 06/18/2020 0600 Gross per 24 hour  Intake 3429.74 ml  Output 3020 ml  Net 409.74 ml   Filed Weights   06/14/20 0325 06/15/20 0355 06/16/20 0331  Weight: 81.4 kg 86.1 kg 87.2 kg    Examination:  General: 79 year old white male he is currently on full ventilatory support.  His eyes are open, he does appear to be in mild distress HEENT eyes open sclera nonicteric mucous membranes moist orally intubated Pulmonary: Some scattered rhonchi, tachypneic, positive for accessory use.  Positive for nasal flare. Currently on pressure control of 12/PEEP 10.  Tidal volumes in the 5-600 range, respiratory rate mid 30s, does exhibit some mild air trapping. Cardiac tachycardic rhythm slightly diaphoretic no murmur rub or gallop Abdomen is soft nontender, still having liquid stools Extremities are warm, dry, pulses are palpable Neuro awake, not interactive pupil on the left nonreactive  Resolved Hospital Problem list     Assessment & Plan:   Acute hypoxic respiratory failure w/ ARDS due to COVID 19 pneumonia, pcxr diffuse bilateral airspace disease. Little lower volume film accounting for perhaps a little worse aeration c/w yesterday but not much change.  O2 requirements: Up to 70%, he is exhibiting air trapping,  his mental status at this point/agitation seems to be a major barrier to progress Driving pressures currently 14 Plan Continue pressure control ventilation, with daily attempts at pressure support Driving pressure goal less than 15 pplat goal < 30  RASS goal -1 can  cont lasix as BP/BUN/Cr allow-->hold for today I think he is euvolemic  Pulse ox goal >88 VAP bundle  No need for Prone position.  Adding ativan taper (seems like things are worse since stopping versed)  Aspiration/HCAP: Sputum growing Enterobacter -Aspirated during prone positioning Plan Day 6 of 7 cefepime   Acute metabolic encephalopathy due to ICU delirium-->this is one of the driving factors at this point  Plan Add ativan taper Increase precedex to 1.7 Cont fent for now RASS goal 0 to -1 Add low dose Seroquel    DM2 with hyperglycemia, complicated by steroids: Remains hyperglycemic Plan Cont ssi Cont lantus Cont scheduled every 4 h tubefeed coverage Cont tradjenta No change in lantus at 35 units   Fluid and electrolyte imbalance: Hypokalemia, hypernatremia -Hypernatremia has improved/normalized Plan Replace K     Best practice:  Diet: tube feeding Pain/Anxiety/Delirium protocol (if indicated): as above VAP protocol (if indicated): yes DVT prophylaxis: lovenox GI prophylaxis: famoditine Glucose control: as above Mobility: bed rest Code Status: full Family Communication: updated his wife by phone  Daily  Disposition: remain in ICU  Labs   CBC: Recent Labs  Lab 06/15/20 0330 06/15/20 0521 06/16/20 0410 06/17/20 0405 06/18/20 0551  WBC 3.1* 7.7 8.2 9.7 9.2  NEUTROABS 2.6 6.3  --  8.7*  --   HGB 4.9* 12.6* 12.2* 11.7* 12.7*  HCT 16.9* 41.2 38.6* 36.9* 38.1*  MCV 110.5* 103.3* 101.6* 101.1* 96.9  PLT 43* 104* 107* 94* 99*    Basic Metabolic Panel: Recent Labs  Lab 06/12/20 0500 06/12/20 0500 06/13/20 0446 06/13/20 0446 06/14/20 0146 06/14/20 0146 06/15/20 0330 06/15/20 0521 06/16/20 0410 06/17/20 0405 06/18/20 0551  NA 147*   < > 151*   < > 153*   < > 146* 148* 145 139 138  K 3.6   < > 3.3*   < > 3.4*   < > 2.6* 4.1 3.4* 3.3* 3.5  CL 96*   < > 98   < > 100   < > 111 98 101 100 100  CO2 40*   < > 43*   < > 40*   < > 28 37* 33* 31 29    GLUCOSE 107*   < > 57*   < > 142*   < > 141* 231* 176* 276* 237*  BUN 67*   < > 68*   < > 62*   < > 50* 72* 62* 50* 55*  CREATININE 0.71   < > 0.90   < > 0.88   < > 0.62 0.97 0.85 0.90 0.97  CALCIUM 8.7*   < > 8.7*   < > 8.7*   < > 6.6* 8.6* 8.5* 7.3* 7.7*  MG 2.3  --  2.1  --  2.0  --   --   --   --   --   --   PHOS 4.0  --  4.2  --  3.1  --   --   --   --   --   --    < > = values in this interval not displayed.   GFR: Estimated Creatinine Clearance: 66.1 mL/min (by C-G formula based on SCr of 0.97 mg/dL). Recent Labs  Lab 06/15/20  0272 06/16/20 0410 06/17/20 0405 06/18/20 0551  WBC 7.7 8.2 9.7 9.2    Liver Function Tests: Recent Labs  Lab 06/15/20 0330 06/15/20 0521 06/17/20 0405 06/18/20 0551  AST 22 27 29 30   ALT 18 23 19 23   ALKPHOS 52 64 66 73  BILITOT 0.5 0.7 0.6 0.6  PROT 4.2* 5.5* 4.9* 5.4*  ALBUMIN 2.0* 2.5* 2.3* 2.3*   No results for input(s): LIPASE, AMYLASE in the last 168 hours. No results for input(s): AMMONIA in the last 168 hours.  ABG    Component Value Date/Time   PHART 7.495 (H) 06/18/2020 0738   PCO2ART 40.0 06/18/2020 0738   PO2ART 63.1 (L) 06/18/2020 0738   HCO3 30.5 (H) 06/18/2020 0738   ACIDBASEDEF 0.4 06/06/2020 1100   O2SAT 93.2 06/18/2020 0738     Coagulation Profile: No results for input(s): INR, PROTIME in the last 168 hours.  Cardiac Enzymes: No results for input(s): CKTOTAL, CKMB, CKMBINDEX, TROPONINI in the last 168 hours.  HbA1C: Hemoglobin A1C  Date/Time Value Ref Range Status  03/31/2020 10:39 AM 7.2 (A) 4.0 - 5.6 % Final  03/04/2018 10:54 AM 7.8  Final   Hgb A1c MFr Bld  Date/Time Value Ref Range Status  05/31/2020 10:49 AM 7.5 (H) 4.8 - 5.6 % Final    Comment:    (NOTE) Pre diabetes:          5.7%-6.4%  Diabetes:              >6.4%  Glycemic control for   <7.0% adults with diabetes     CBG: Recent Labs  Lab 06/16/20 1557 06/16/20 2154 06/17/20 0056 06/17/20 0344 06/17/20 0814  GLUCAP 224* 319*  292* 278* 249*     Critical care time: 32 minutes.     Erick Colace ACNP-BC [place] Pager # [phone] OR # [phone] if no answer

## 2020-06-19 ENCOUNTER — Inpatient Hospital Stay (HOSPITAL_COMMUNITY): Payer: Medicare HMO

## 2020-06-19 DIAGNOSIS — Z9911 Dependence on respirator [ventilator] status: Secondary | ICD-10-CM

## 2020-06-19 LAB — CBC
HCT: 36.2 % — ABNORMAL LOW (ref 39.0–52.0)
Hemoglobin: 11.7 g/dL — ABNORMAL LOW (ref 13.0–17.0)
MCH: 31.6 pg (ref 26.0–34.0)
MCHC: 32.3 g/dL (ref 30.0–36.0)
MCV: 97.8 fL (ref 80.0–100.0)
Platelets: 97 10*3/uL — ABNORMAL LOW (ref 150–400)
RBC: 3.7 MIL/uL — ABNORMAL LOW (ref 4.22–5.81)
RDW: 14.5 % (ref 11.5–15.5)
WBC: 7.8 10*3/uL (ref 4.0–10.5)
nRBC: 0 % (ref 0.0–0.2)

## 2020-06-19 LAB — COMPREHENSIVE METABOLIC PANEL
ALT: 24 U/L (ref 0–44)
AST: 29 U/L (ref 15–41)
Albumin: 2 g/dL — ABNORMAL LOW (ref 3.5–5.0)
Alkaline Phosphatase: 65 U/L (ref 38–126)
Anion gap: 8 (ref 5–15)
BUN: 56 mg/dL — ABNORMAL HIGH (ref 8–23)
CO2: 27 mmol/L (ref 22–32)
Calcium: 7.2 mg/dL — ABNORMAL LOW (ref 8.9–10.3)
Chloride: 104 mmol/L (ref 98–111)
Creatinine, Ser: 0.83 mg/dL (ref 0.61–1.24)
GFR calc Af Amer: >60 mL/min (ref >60)
GFR calc non Af Amer: >60 mL/min (ref >60)
Glucose, Bld: 194 mg/dL — ABNORMAL HIGH (ref 70–99)
Potassium: 3.8 mmol/L (ref 3.5–5.1)
Sodium: 139 mmol/L (ref 135–145)
Total Bilirubin: 0.4 mg/dL (ref 0.3–1.2)
Total Protein: 5.1 g/dL — ABNORMAL LOW (ref 6.5–8.1)

## 2020-06-19 MED ORDER — FUROSEMIDE 10 MG/ML IJ SOLN
40.0000 mg | Freq: Every day | INTRAMUSCULAR | Status: DC
  Administered 2020-06-19 – 2020-06-20 (×2): 40 mg via INTRAVENOUS
  Filled 2020-06-19 (×2): qty 4

## 2020-06-19 NOTE — Progress Notes (Signed)
Purple nonblanchable area noted on coccyx at 0800 assessment with scarring noted, unable to determine if purple area was present on admission with noted scarring. Area also evaluated  by Leonie Man RN with same assessment.

## 2020-06-19 NOTE — Progress Notes (Signed)
NAME:  Jim Clarke, MRN:  [id number], DOB:  1940/12/27, LOS: 21 ADMISSION DATE:  05/26/2020, CONSULTATION DATE:  7/23 REFERRING MD:  Bonner Puna, CHIEF COMPLAINT:  Dyspnea   Brief History   79 y/o male admitted with COVID pneumonia on 7/17 after having symptoms for about 10 days.  He was treated with heated high flow, actemra, remdesivir and systemic steroids.  PCCM consulted on 7/23 in the setting of worsening agitated delirium and oxygenation.    Past Medical History  Hyeprtension GERD Hyperlipidemia DM2 Arthritis  Significant Hospital Events   7/17 Admission 7/23 Intubation, paralysed 7/24 Started proning 7/25 Off Levophed 7/26 Off paralytics, proning continued 7/27 vomited while prone 7/28-7/30 repeated aspiration in prone position 7/30 enterobacter pneumonia, pressors briefly 8/2 decreasing sedation, stopping oral sedatives 8/4 change to pressure control for synchrony 8/5: Tolerating pressure support ventilation.  Continuing diuresis, transitioning to Precedex infusion 8/5 nursing staff noting left pupil not reactive. CT head negative  8/6: Tmax 99.3; wbc stable. I&O -1.6 respiratory rate increased, increased work of breathing, requiring increased FiO2 with air trapping and auto PEEP, suspecting having some difficulty transitioning off benzodiazepines, adding Ativan taper Consults:  PCCM  Procedures:  7/23 ETT >  7/23 Left subclavian central line > 8/3 Radial A line 7/24 >  8/3 PICC >   Significant Diagnostic Tests:    Micro Data:  7/17 SARS COV 2 > positive 7/17 blood culture >  7/30 resp culture > enterobacter  Antimicrobials:  7/17 actemra > 7/18 7/17 remdesivir > 7/21 7/17 methylprednisolone > (completed)   7/31 unasyn> 8/1 8/1 cefepime >   Interim history/subjective:  Air hungry today.  Remains heavily sedated, intubated.  Objective   Blood pressure (!) 114/59, pulse 96, temperature 99.3 F (37.4 C), temperature source Axillary, resp. rate (!) 22,  height 5\' 7"  (1.702 m), weight 87.1 kg, SpO2 93 %. CVP:  [6 mmHg-15 mmHg] 15 mmHg  Vent Mode: PRVC FiO2 (%):  [65 %] 65 % Set Rate:  [16 bmp] 16 bmp Vt Set:  [600 mL] 600 mL PEEP:  [10 cmH20] 10 cmH20 Plateau Pressure:  [39 cmH20] 39 cmH20   Intake/Output Summary (Last 24 hours) at 06/19/2020 1742 Last data filed at 06/19/2020 1703 Gross per 24 hour  Intake 1687.55 ml  Output 2325 ml  Net -637.45 ml   Filed Weights   06/15/20 0355 06/16/20 0331 06/19/20 0312  Weight: 86.1 kg 87.2 kg 87.1 kg    Examination: General: Elderly man lying in bed intubated, heavily sedated HEENT [place]/AT, eyes anicteric, mild scleral edema.  ETT and OGT in place. Pulmonary: Tachypneic with increased respiratory effort, no significant vent desynchrony.  Breathing above set rate on the vent.  Pulse bilaterally.  No significant tracheal secretions. Cardiac: Tachycardic, regular rhythm, no murmurs Abdomen: Soft, nontender, nondistended.  Flexi-Seal in place. Extremities: Lower extremity pitting edema, no cyanosis Neuro RASS -5, pupils reactive, not withdrawing to painful stimulation  CXR 8/7 personally reviewed-ET tube in appropriate position.  Slightly improving left lower lobe, right upper lobe opacities.  Resolved Hospital Problem list     Assessment & Plan:   Acute hypoxic respiratory failure w/ ARDS due to COVID 19 pneumonia HAP- Enterobacter -Continue low tidal volume ventilation.  Switching to [facility] given high tidal volumes on pressure control and concern for VILI. -Oxygen requirements too high for SBT.  Given increased respiratory effort, will continue current sedation-fentanyl and Precedex. -Diuresis -VAP prevention bundle -Complete 7-day course of cefepime   Acute metabolic encephalopathy due to  ICU delirium-->this is one of the driving factors at this point  Plan -Continue Ativan taper -Continue Precedex and fentanyl as needed -Day night reorientation   DM2 with hyperglycemia, complicated  by steroids: Remains hyperglycemic Plan -Continue insulin and linagliptin -Goal BG 140-180 while admitted to the ICU   Fluid and electrolyte imbalance: Hypokalemia, hypernatremia -Hypernatremia has improved/normalized Plan -Maintain euvolemia -Continue to monitor -Supplemental potassium  Acute anemia, thrombocytopenia likely due to critical illness -Continue to monitor -Transfuse for hemoglobin less than 7 or hemodynamically significant bleeding   Best practice:  Diet: tube feeding Pain/Anxiety/Delirium protocol (if indicated): as above VAP protocol (if indicated): yes DVT prophylaxis: lovenox GI prophylaxis: famoditine Glucose control: as above Mobility: bed rest Code Status: full Family Communication: wife Disposition: remain in ICU  Labs   CBC: Recent Labs  Lab 06/15/20 0330 06/15/20 0330 06/15/20 0521 06/16/20 0410 06/17/20 0405 06/18/20 0551 06/19/20 0228  WBC 3.1*   < > 7.7 8.2 9.7 9.2 7.8  NEUTROABS 2.6  --  6.3  --  8.7*  --   --   HGB 4.9*   < > 12.6* 12.2* 11.7* 12.7* 11.7*  HCT 16.9*   < > 41.2 38.6* 36.9* 38.1* 36.2*  MCV 110.5*   < > 103.3* 101.6* 101.1* 96.9 97.8  PLT 43*   < > 104* 107* 94* 99* 97*   < > = values in this interval not displayed.    Basic Metabolic Panel: Recent Labs  Lab 06/13/20 0446 06/13/20 0446 06/14/20 0146 06/15/20 0330 06/15/20 0521 06/16/20 0410 06/17/20 0405 06/18/20 0551 06/19/20 0228  NA 151*   < > 153*   < > 148* 145 139 138 139  K 3.3*   < > 3.4*   < > 4.1 3.4* 3.3* 3.5 3.8  CL 98   < > 100   < > 98 101 100 100 104  CO2 43*   < > 40*   < > 37* 33* 31 29 27   GLUCOSE 57*   < > 142*   < > 231* 176* 276* 237* 194*  BUN 68*   < > 62*   < > 72* 62* 50* 55* 56*  CREATININE 0.90   < > 0.88   < > 0.97 0.85 0.90 0.97 0.83  CALCIUM 8.7*   < > 8.7*   < > 8.6* 8.5* 7.3* 7.7* 7.2*  MG 2.1  --  2.0  --   --   --   --   --   --   PHOS 4.2  --  3.1  --   --   --   --   --   --    < > = values in this interval not  displayed.   GFR: Estimated Creatinine Clearance: 77.3 mL/min (by C-G formula based on SCr of 0.83 mg/dL). Recent Labs  Lab 06/16/20 0410 06/17/20 0405 06/18/20 0551 06/19/20 0228  WBC 8.2 9.7 9.2 7.8    Liver Function Tests: Recent Labs  Lab 06/15/20 0330 06/15/20 0521 06/17/20 0405 06/18/20 0551 06/19/20 0228  AST 22 27 29 30 29   ALT 18 23 19 23 24   ALKPHOS 52 64 66 73 65  BILITOT 0.5 0.7 0.6 0.6 0.4  PROT 4.2* 5.5* 4.9* 5.4* 5.1*  ALBUMIN 2.0* 2.5* 2.3* 2.3* 2.0*   No results for input(s): LIPASE, AMYLASE in the last 168 hours. No results for input(s): AMMONIA in the last 168 hours.  ABG    Component Value Date/Time   PHART  7.495 (H) 06/18/2020 0738   PCO2ART 40.0 06/18/2020 0738   PO2ART 63.1 (L) 06/18/2020 0738   HCO3 30.5 (H) 06/18/2020 0738   ACIDBASEDEF 0.4 06/06/2020 1100   O2SAT 93.2 06/18/2020 0738     Coagulation Profile: No results for input(s): INR, PROTIME in the last 168 hours.  Cardiac Enzymes: No results for input(s): CKTOTAL, CKMB, CKMBINDEX, TROPONINI in the last 168 hours.  HbA1C: Hemoglobin A1C  Date/Time Value Ref Range Status  03/31/2020 10:39 AM 7.2 (A) 4.0 - 5.6 % Final  03/04/2018 10:54 AM 7.8  Final   Hgb A1c MFr Bld  Date/Time Value Ref Range Status  05/31/2020 10:49 AM 7.5 (H) 4.8 - 5.6 % Final    Comment:    (NOTE) Pre diabetes:          5.7%-6.4%  Diabetes:              >6.4%  Glycemic control for   <7.0% adults with diabetes     CBG: Recent Labs  Lab 06/17/20 1605 06/17/20 1949 06/18/20 0023 06/18/20 0354 06/18/20 0830  GLUCAP 277* 208* 301* 241* 144*      This patient is critically ill with multiple organ system failure which requires frequent high complexity decision making, assessment, support, evaluation, and titration of therapies. This was completed through the application of advanced monitoring technologies and extensive interpretation of multiple databases. During this encounter critical care  time was devoted to patient care services described in this note for 34 minutes.  Julian Hy, DO 06/19/20 5:56 PM [facility]

## 2020-06-20 ENCOUNTER — Inpatient Hospital Stay (HOSPITAL_COMMUNITY): Payer: Medicare HMO

## 2020-06-20 DIAGNOSIS — G9340 Encephalopathy, unspecified: Secondary | ICD-10-CM

## 2020-06-20 DIAGNOSIS — R579 Shock, unspecified: Secondary | ICD-10-CM

## 2020-06-20 LAB — BASIC METABOLIC PANEL
Anion gap: 12 (ref 5–15)
Anion gap: 11 (ref 5–15)
BUN: 84 mg/dL — ABNORMAL HIGH (ref 8–23)
BUN: 106 mg/dL — ABNORMAL HIGH (ref 8–23)
CO2: 21 mmol/L — ABNORMAL LOW (ref 22–32)
CO2: 21 mmol/L — ABNORMAL LOW (ref 22–32)
Calcium: 6.7 mg/dL — ABNORMAL LOW (ref 8.9–10.3)
Calcium: 7.2 mg/dL — ABNORMAL LOW (ref 8.9–10.3)
Chloride: 100 mmol/L (ref 98–111)
Chloride: 101 mmol/L (ref 98–111)
Creatinine, Ser: 1.29 mg/dL — ABNORMAL HIGH (ref 0.61–1.24)
Creatinine, Ser: 2.13 mg/dL — ABNORMAL HIGH (ref 0.61–1.24)
GFR calc Af Amer: >60 mL/min (ref >60)
GFR calc Af Amer: 33 mL/min — ABNORMAL LOW (ref >60)
GFR calc non Af Amer: 53 mL/min — ABNORMAL LOW (ref >60)
GFR calc non Af Amer: 29 mL/min — ABNORMAL LOW (ref >60)
Glucose, Bld: 331 mg/dL — ABNORMAL HIGH (ref 70–99)
Glucose, Bld: 293 mg/dL — ABNORMAL HIGH (ref 70–99)
Potassium: 4.3 mmol/L (ref 3.5–5.1)
Potassium: 4.7 mmol/L (ref 3.5–5.1)
Sodium: 133 mmol/L — ABNORMAL LOW (ref 135–145)
Sodium: 133 mmol/L — ABNORMAL LOW (ref 135–145)

## 2020-06-20 LAB — URINALYSIS, ROUTINE W REFLEX MICROSCOPIC
Bilirubin Urine: NEGATIVE
Glucose, UA: 50 mg/dL — AB
Ketones, ur: 5 mg/dL — AB
Nitrite: NEGATIVE
Protein, ur: 30 mg/dL — AB
RBC / HPF: >50 RBC/hpf — ABNORMAL HIGH (ref 0–5)
Specific Gravity, Urine: 1.016 (ref 1.005–1.030)
pH: 5 (ref 5.0–8.0)

## 2020-06-20 LAB — CBC
HCT: 36.7 % — ABNORMAL LOW (ref 39.0–52.0)
Hemoglobin: 12 g/dL — ABNORMAL LOW (ref 13.0–17.0)
MCH: 31.7 pg (ref 26.0–34.0)
MCHC: 32.7 g/dL (ref 30.0–36.0)
MCV: 97.1 fL (ref 80.0–100.0)
Platelets: 124 10*3/uL — ABNORMAL LOW (ref 150–400)
RBC: 3.78 MIL/uL — ABNORMAL LOW (ref 4.22–5.81)
RDW: 15 % (ref 11.5–15.5)
WBC: 9.4 10*3/uL (ref 4.0–10.5)
nRBC: 0 % (ref 0.0–0.2)

## 2020-06-20 LAB — GLUCOSE, CAPILLARY
Glucose-Capillary: 238 mg/dL — ABNORMAL HIGH (ref 70–99)
Glucose-Capillary: 265 mg/dL — ABNORMAL HIGH (ref 70–99)
Glucose-Capillary: 273 mg/dL — ABNORMAL HIGH (ref 70–99)
Glucose-Capillary: 278 mg/dL — ABNORMAL HIGH (ref 70–99)
Glucose-Capillary: 296 mg/dL — ABNORMAL HIGH (ref 70–99)
Glucose-Capillary: 314 mg/dL — ABNORMAL HIGH (ref 70–99)

## 2020-06-20 LAB — LACTIC ACID, PLASMA: Lactic Acid, Venous: 1.9 mmol/L (ref 0.5–1.9)

## 2020-06-20 LAB — PROCALCITONIN: Procalcitonin: 6.01 ng/mL

## 2020-06-20 MED ORDER — PIPERACILLIN-TAZOBACTAM 4.5 G IVPB: 4.5000 g | Freq: Three times a day (TID) | INTRAVENOUS | Status: DC

## 2020-06-20 MED ORDER — ACETAMINOPHEN 160 MG/5ML PO SOLN
500.0000 mg | Freq: Once | ORAL | Status: AC
  Administered 2020-06-20: 500 mg via NASOGASTRIC

## 2020-06-20 MED ORDER — FUROSEMIDE 10 MG/ML IJ SOLN
40.0000 mg | Freq: Two times a day (BID) | INTRAMUSCULAR | Status: DC
  Administered 2020-06-20: 40 mg via INTRAVENOUS
  Filled 2020-06-20: qty 4

## 2020-06-20 MED ORDER — INSULIN GLARGINE 100 UNIT/ML ~~LOC~~ SOLN
35.0000 [IU] | Freq: Two times a day (BID) | SUBCUTANEOUS | Status: DC
  Administered 2020-06-20: 35 [IU] via SUBCUTANEOUS
  Filled 2020-06-20 (×2): qty 0.35

## 2020-06-20 MED ORDER — LACTATED RINGERS IV BOLUS
500.0000 mL | Freq: Once | INTRAVENOUS | Status: AC
  Administered 2020-06-20: 500 mL via INTRAVENOUS

## 2020-06-20 MED ORDER — VANCOMYCIN HCL IN DEXTROSE 1-5 GM/200ML-% IV SOLN
1000.0000 mg | INTRAVENOUS | Status: DC
  Administered 2020-06-22: 1000 mg via INTRAVENOUS
  Filled 2020-06-20: qty 200

## 2020-06-20 MED ORDER — VANCOMYCIN HCL 1750 MG/350ML IV SOLN
1750.0000 mg | INTRAVENOUS | Status: AC
  Administered 2020-06-21: 1750 mg via INTRAVENOUS
  Filled 2020-06-20: qty 350

## 2020-06-20 MED ORDER — PIPERACILLIN-TAZOBACTAM 3.375 G IVPB
3.3750 g | Freq: Three times a day (TID) | INTRAVENOUS | Status: DC
  Administered 2020-06-20 – 2020-06-22 (×5): 3.375 g via INTRAVENOUS
  Filled 2020-06-20 (×6): qty 50

## 2020-06-20 MED ORDER — INSULIN ASPART 100 UNIT/ML ~~LOC~~ SOLN
7.0000 [IU] | SUBCUTANEOUS | Status: DC
  Administered 2020-06-20 – 2020-06-21 (×5): 7 [IU] via SUBCUTANEOUS

## 2020-06-20 MED ORDER — VASOPRESSIN 20 UNITS/100 ML INFUSION FOR SHOCK
0.0000 [IU]/min | INTRAVENOUS | Status: DC
  Administered 2020-06-20 – 2020-06-22 (×4): 0.03 [IU]/min via INTRAVENOUS
  Filled 2020-06-20 (×5): qty 100

## 2020-06-20 NOTE — Progress Notes (Signed)
Assisted tele visit to patient with family member.  Edwing Figley M, RN  

## 2020-06-20 NOTE — Care Plan (Signed)
ELink RN notified of pt's current status. HR 130's, Temp 38.5, increasing need for pressors. Awaiting callback from MD.

## 2020-06-20 NOTE — Plan of Care (Signed)
Fever >101, temps ranging higher over past few days. Re-culture today. Change foley before UA.   Julian Hy, DO 06/20/20 4:57 PM [facility]

## 2020-06-20 NOTE — Progress Notes (Signed)
Pts wife and daughter updated via telephone, pts daughter video visited assisted via [place]

## 2020-06-20 NOTE — Care Plan (Signed)
Elink RN notified of Max dose levophed gtt.

## 2020-06-20 NOTE — Progress Notes (Signed)
PHARMACY NOTE:  ANTIMICROBIAL RENAL DOSAGE ADJUSTMENT  Current antimicrobial regimen includes a mismatch between antimicrobial dosage and estimated renal function.  As per policy approved by the Pharmacy & Therapeutics and Medical Executive Committees, the antimicrobial dosage will be adjusted accordingly.  Current antimicrobial dosage:  Zosyn 4.5gm IV q8h  Indication: Sepsis  Renal Function:  Estimated Creatinine Clearance: 30.1 mL/min (A) (by C-G formula based on SCr of 2.13 mg/dL (H)).     Antimicrobial dosage has been changed to:  Zosyn 3.375gm IV q8h (extended 4 hr infusion)   Thank you for allowing pharmacy to be a part of this patient's care.  Everette Rank, [facility] 06/20/2020 10:58 PM

## 2020-06-20 NOTE — Progress Notes (Signed)
Pharmacy Antibiotic Note  Jim Clarke is a 79 y.o. male admitted on 06/12/2020 with COVID PNA. Patient is intubated. Pharmacy has been consulted for Unasyn dosing for aspiration PNA. GNR in trach aspirate.   8/1 TA now with Enterobacter; Unasyn changed to Cefepime 8/7 Cefepime d/c'ed  06/20/20 Patient with fever spikes Chest Xray = multifocal lower lobe PNA not changed from prior Pharmacy consulted to dose Vancomycin; Zosyn also started   Plan: Zosyn 3.375gm IV q8h started Vancomycin 1750mg  IV x 1 dose followed by 1000mg  IV q24h Follow SCr daily while on both Vanc and Zosyn Will f/u renal function, planned duration  Follow culture results and sensitivities  Height: 5\' 7"  (170.2 cm) Weight:  (bed error, unable to weigh pt ) IBW/kg (Calculated) : 66.1  Temp (24hrs), Avg:101.3 F (38.5 C), Min:99.3 F (37.4 C), Max:102.2 F (39 C)  Recent Labs  Lab 06/16/20 0410 06/16/20 0410 06/17/20 0405 06/18/20 0551 06/19/20 0228 06/20/20 0400 06/20/20 2030  WBC 8.2  --  9.7 9.2 7.8 9.4  --   CREATININE 0.85   < > 0.90 0.97 0.83 1.29* 2.13*  LATICACIDVEN  --   --   --   --   --   --  1.9   < > = values in this interval not displayed.    Estimated Creatinine Clearance: 30.1 mL/min (A) (by C-G formula based on SCr of 2.13 mg/dL (H)).    Allergies  Allergen Reactions  . Sulfa Antibiotics    Remdesivir 7/17 >> 7/21 Actemra 7/17  7/31 Unasyn >> 8/1 8/1 cefepime >> 8/7 8/8 Zosyn >> 8/8 Vanc >>  7/17 BCx: NGF 7/17 MRSA PCR: neg 7/17 COVID: positive   7/30 TA: Enterobacter aerogenes  8/8 BCx: 8/8 Sputum Cx:   Thank you for allowing pharmacy to be a part of this patient's care.  Everette Rank, PharmD 06/20/2020 11:12 PM

## 2020-06-20 NOTE — Care Plan (Signed)
Spoke with Dr. Prudencio Burly re: pt max dose of levo gtt and pt condition. See orders.

## 2020-06-20 NOTE — Progress Notes (Signed)
[place] Progress Note Patient Name: Jim Clarke DOB: 25-Feb-1941 MRN: [id number]   Date of Service  06/20/2020  HPI/Events of Note  Earlier seen.  Fever spikes. Just finished cefpime yesterday.  s/p eneterobactor HAP treatment in this admit earlier.  Camera: MAP on art line < 65. maxed out on Levo. UOP low.  Blood cx, Ur cx sent from team in day time for same.  CxR stat showed stable Left lower zone > rt air space, reticular stable. LA 2.3 Procal 6.  eICU Interventions  - LR bolus 500 - start Vaso - start Vanc/zosyn. enterobactor from trach from 8/1: sensitive to zosyn.  - follow UOP and LA. - consider ID. source not clear. Fungal?. Wbc normal.      Intervention Category Major Interventions: Sepsis - evaluation and management  Elmer Sow 06/20/2020, 10:56 PM

## 2020-06-20 NOTE — Progress Notes (Signed)
RT's changed Arterial line dressing. No problems noted.

## 2020-06-20 NOTE — Care Plan (Signed)
Spoke with Dr. Prudencio Burly re: pt status. Orders received.

## 2020-06-20 NOTE — Progress Notes (Signed)
NAME:  Jim Clarke, MRN:  [id number], DOB:  Aug 20, 1941, LOS: 48 ADMISSION DATE:  05/20/2020, CONSULTATION DATE:  7/23 REFERRING MD:  Bonner Puna, CHIEF COMPLAINT:  Dyspnea   Brief History   79 y/o male admitted with COVID pneumonia on 7/17 after having symptoms for about 10 days.  He was treated with heated high flow, actemra, remdesivir and systemic steroids.  PCCM consulted on 7/23 in the setting of worsening agitated delirium and oxygenation.    Past Medical History  Hyeprtension GERD Hyperlipidemia DM2 Arthritis  Significant Hospital Events   7/17 Admission 7/23 Intubation, paralysed 7/24 Started proning 7/25 Off Levophed 7/26 Off paralytics, proning continued 7/27 vomited while prone 7/28-7/30 repeated aspiration in prone position 7/30 enterobacter pneumonia, pressors briefly 8/2 decreasing sedation, stopping oral sedatives 8/4 change to pressure control for synchrony 8/5: Tolerating pressure support ventilation.  Continuing diuresis, transitioning to Precedex infusion 8/5 nursing staff noting left pupil not reactive. CT head negative  8/6: Tmax 99.3; wbc stable. I&O -1.6 respiratory rate increased, increased work of breathing, requiring increased FiO2 with air trapping and auto PEEP, suspecting having some difficulty transitioning off benzodiazepines, adding Ativan taper Consults:  PCCM  Procedures:  7/23 ETT >  7/23 Left subclavian central line > 8/3 Radial A line 7/24 >  8/3 PICC >   Significant Diagnostic Tests:    Micro Data:  7/17 SARS COV 2 > positive 7/17 blood culture >  7/30 resp culture > enterobacter  Antimicrobials:  7/17 actemra > 7/18 7/17 remdesivir > 7/21 7/17 methylprednisolone > (completed)   7/31 unasyn> 8/1 8/1 cefepime >  8/7  Interim history/subjective:  Had short-term episodes of tachycardia with hypertension that resolved spontaneously overnight.  Objective   Blood pressure (!) 114/59, pulse (!) 113, temperature 99.3 F (37.4 C),  temperature source Axillary, resp. rate (!) 23, height 5\' 7"  (1.702 m), weight 87.1 kg, SpO2 95 %. CVP:  [0 mmHg-15 mmHg] 7 mmHg  Vent Mode: PRVC FiO2 (%):  [65 %] 65 % Set Rate:  [16 bmp] 16 bmp Vt Set:  [600 mL] 600 mL PEEP:  [10 cmH20] 10 cmH20 Plateau Pressure:  [34 cmH20-39 cmH20] 34 cmH20   Intake/Output Summary (Last 24 hours) at 06/20/2020 1013 Last data filed at 06/20/2020 0600 Gross per 24 hour  Intake 2900.33 ml  Output 1700 ml  Net 1200.33 ml   Filed Weights   06/15/20 0355 06/16/20 0331 06/19/20 0312  Weight: 86.1 kg 87.2 kg 87.1 kg    Examination: General: Elderly man intubated, sedated HEENT [place]/AT, eyes anicteric.  ETT and OGT in place Pulmonary: Tachypneic breathing above the vent.  Synchronous with the vent.  Peak pressures in the upper 20s, unable to assess plateau due to respiratory effort.  Thick secretions from ETT.  Rales cleared with suctioning. Cardiac: Tachycardic, regular rhythm, no murmurs Abdomen: Soft, nontender, minimally distended.  Flexi-Seal. Extremities: Lower extremity pitting edema, no cyanosis or clubbing Neuro RASS -5, pupils reactive, less air hungry today.   Resolved Hospital Problem list     Assessment & Plan:   Acute hypoxic respiratory failure w/ ARDS due to COVID 19 pneumonia HAP- Enterobacter -Continue low tidal volume ventilation on PRVC.  Previously on pressure control but with high volumes. -Continue titrate down FiO2 as able.  Daily SAT and SBT as tolerated.  Oxygen requirements remain increased. -Fentanyl and Precedex for sedation. Ativan taper. -Lasix increased to twice daily dosing. -VAP prevention bundle -Completed 7-day course of cefepime   Acute metabolic encephalopathy due to  ICU delirium-->this is one of the driving factors at this point  Plan -Continue Ativan taper -Continue Precedex and fentanyl as needed -Delirium precautions   DM2 with hyperglycemia, complicated by steroids, hyperglycemia  uncontrolled Plan -Continue linagliptin -Increase Levemir to 35 units twice daily and to feed coverage to 7 units every 4 hours while on tube feeds -Continue sliding scale insulin -Goal BG 140-180 while admitted to the ICU   Fluid and electrolyte imbalance: Hypokalemia, hypernatremia>> no hyponatremia -Hypernatremia has improved/normalized Plan -Diuresis to maintain euvolemia -Continue to monitor -Discontinue free water today and continue to monitor sodium  Acute anemia, thrombocytopenia likely due to critical illness -Continue to monitor -Transfuse for hemoglobin less than 7 or hemodynamically significant bleeding   Best practice:  Diet: tube feeding Pain/Anxiety/Delirium protocol (if indicated): as above VAP protocol (if indicated): yes DVT prophylaxis: lovenox GI prophylaxis: famoditine Glucose control: as above Mobility: bed rest Code Status: full Family Communication: wife Disposition: remain in ICU  Labs   CBC: Recent Labs  Lab 06/15/20 0330 06/15/20 0330 06/15/20 0521 06/15/20 0521 06/16/20 0410 06/17/20 0405 06/18/20 0551 06/19/20 0228 06/20/20 0400  WBC 3.1*   < > 7.7   < > 8.2 9.7 9.2 7.8 9.4  NEUTROABS 2.6  --  6.3  --   --  8.7*  --   --   --   HGB 4.9*   < > 12.6*   < > 12.2* 11.7* 12.7* 11.7* 12.0*  HCT 16.9*   < > 41.2   < > 38.6* 36.9* 38.1* 36.2* 36.7*  MCV 110.5*   < > 103.3*   < > 101.6* 101.1* 96.9 97.8 97.1  PLT 43*   < > 104*   < > 107* 94* 99* 97* 124*   < > = values in this interval not displayed.    Basic Metabolic Panel: Recent Labs  Lab 06/14/20 0146 06/15/20 0330 06/16/20 0410 06/17/20 0405 06/18/20 0551 06/19/20 0228 06/20/20 0400  NA 153*   < > 145 139 138 139 133*  K 3.4*   < > 3.4* 3.3* 3.5 3.8 4.3  CL 100   < > 101 100 100 104 100  CO2 40*   < > 33* 31 29 27  21*  GLUCOSE 142*   < > 176* 276* 237* 194* 331*  BUN 62*   < > 62* 50* 55* 56* 84*  CREATININE 0.88   < > 0.85 0.90 0.97 0.83 1.29*  CALCIUM 8.7*   < > 8.5*  7.3* 7.7* 7.2* 6.7*  MG 2.0  --   --   --   --   --   --   PHOS 3.1  --   --   --   --   --   --    < > = values in this interval not displayed.   GFR: Estimated Creatinine Clearance: 49.7 mL/min (A) (by C-G formula based on SCr of 1.29 mg/dL (H)). Recent Labs  Lab 06/17/20 0405 06/18/20 0551 06/19/20 0228 06/20/20 0400  WBC 9.7 9.2 7.8 9.4    Liver Function Tests: Recent Labs  Lab 06/15/20 0330 06/15/20 0521 06/17/20 0405 06/18/20 0551 06/19/20 0228  AST 22 27 29 30 29   ALT 18 23 19 23 24   ALKPHOS 52 64 66 73 65  BILITOT 0.5 0.7 0.6 0.6 0.4  PROT 4.2* 5.5* 4.9* 5.4* 5.1*  ALBUMIN 2.0* 2.5* 2.3* 2.3* 2.0*   No results for input(s): LIPASE, AMYLASE in the last 168 hours. No results for  input(s): AMMONIA in the last 168 hours.  ABG    Component Value Date/Time   PHART 7.495 (H) 06/18/2020 0738   PCO2ART 40.0 06/18/2020 0738   PO2ART 63.1 (L) 06/18/2020 0738   HCO3 30.5 (H) 06/18/2020 0738   ACIDBASEDEF 0.4 06/06/2020 1100   O2SAT 93.2 06/18/2020 0738     Coagulation Profile: No results for input(s): INR, PROTIME in the last 168 hours.  Cardiac Enzymes: No results for input(s): CKTOTAL, CKMB, CKMBINDEX, TROPONINI in the last 168 hours.  HbA1C: Hemoglobin A1C  Date/Time Value Ref Range Status  03/31/2020 10:39 AM 7.2 (A) 4.0 - 5.6 % Final  03/04/2018 10:54 AM 7.8  Final   Hgb A1c MFr Bld  Date/Time Value Ref Range Status  05/31/2020 10:49 AM 7.5 (H) 4.8 - 5.6 % Final    Comment:    (NOTE) Pre diabetes:          5.7%-6.4%  Diabetes:              >6.4%  Glycemic control for   <7.0% adults with diabetes     CBG: Recent Labs  Lab 06/18/20 0023 06/18/20 0354 06/18/20 0830 06/20/20 0407 06/20/20 0740  GLUCAP 301* 241* 144* 314* 265*      This patient is critically ill with multiple organ system failure which requires frequent high complexity decision making, assessment, support, evaluation, and titration of therapies. This was completed  through the application of advanced monitoring technologies and extensive interpretation of multiple databases. During this encounter critical care time was devoted to patient care services described in this note for 32 minutes.  Julian Hy, DO 06/20/20 10:21 AM [facility]

## 2020-06-21 DIAGNOSIS — A0472 Enterocolitis due to Clostridium difficile, not specified as recurrent: Secondary | ICD-10-CM

## 2020-06-21 DIAGNOSIS — A419 Sepsis, unspecified organism: Secondary | ICD-10-CM

## 2020-06-21 LAB — COMPREHENSIVE METABOLIC PANEL
ALT: 40 U/L (ref 0–44)
ALT: 42 U/L (ref 0–44)
AST: 46 U/L — ABNORMAL HIGH (ref 15–41)
AST: 48 U/L — ABNORMAL HIGH (ref 15–41)
Albumin: 1.4 g/dL — ABNORMAL LOW (ref 3.5–5.0)
Albumin: 1.6 g/dL — ABNORMAL LOW (ref 3.5–5.0)
Alkaline Phosphatase: 85 U/L (ref 38–126)
Alkaline Phosphatase: 80 U/L (ref 38–126)
Anion gap: 13 (ref 5–15)
Anion gap: 11 (ref 5–15)
BUN: 110 mg/dL — ABNORMAL HIGH (ref 8–23)
BUN: 136 mg/dL — ABNORMAL HIGH (ref 8–23)
CO2: 17 mmol/L — ABNORMAL LOW (ref 22–32)
CO2: 20 mmol/L — ABNORMAL LOW (ref 22–32)
Calcium: 6.8 mg/dL — ABNORMAL LOW (ref 8.9–10.3)
Calcium: 7 mg/dL — ABNORMAL LOW (ref 8.9–10.3)
Chloride: 101 mmol/L (ref 98–111)
Chloride: 103 mmol/L (ref 98–111)
Creatinine, Ser: 2.55 mg/dL — ABNORMAL HIGH (ref 0.61–1.24)
Creatinine, Ser: 2.74 mg/dL — ABNORMAL HIGH (ref 0.61–1.24)
GFR calc Af Amer: 27 mL/min — ABNORMAL LOW (ref >60)
GFR calc Af Amer: 25 mL/min — ABNORMAL LOW (ref >60)
GFR calc non Af Amer: 23 mL/min — ABNORMAL LOW (ref >60)
GFR calc non Af Amer: 21 mL/min — ABNORMAL LOW (ref >60)
Glucose, Bld: 455 mg/dL — ABNORMAL HIGH (ref 70–99)
Glucose, Bld: 321 mg/dL — ABNORMAL HIGH (ref 70–99)
Potassium: 5.7 mmol/L — ABNORMAL HIGH (ref 3.5–5.1)
Potassium: 5.6 mmol/L — ABNORMAL HIGH (ref 3.5–5.1)
Sodium: 131 mmol/L — ABNORMAL LOW (ref 135–145)
Sodium: 134 mmol/L — ABNORMAL LOW (ref 135–145)
Total Bilirubin: 0.6 mg/dL (ref 0.3–1.2)
Total Bilirubin: 0.6 mg/dL (ref 0.3–1.2)
Total Protein: 4.5 g/dL — ABNORMAL LOW (ref 6.5–8.1)
Total Protein: 4.7 g/dL — ABNORMAL LOW (ref 6.5–8.1)

## 2020-06-21 LAB — GLUCOSE, CAPILLARY
Glucose-Capillary: 295 mg/dL — ABNORMAL HIGH (ref 70–99)
Glucose-Capillary: 318 mg/dL — ABNORMAL HIGH (ref 70–99)
Glucose-Capillary: 372 mg/dL — ABNORMAL HIGH (ref 70–99)
Glucose-Capillary: 141 mg/dL — ABNORMAL HIGH (ref 70–99)
Glucose-Capillary: 175 mg/dL — ABNORMAL HIGH (ref 70–99)
Glucose-Capillary: 176 mg/dL — ABNORMAL HIGH (ref 70–99)
Glucose-Capillary: 182 mg/dL — ABNORMAL HIGH (ref 70–99)
Glucose-Capillary: 211 mg/dL — ABNORMAL HIGH (ref 70–99)
Glucose-Capillary: 214 mg/dL — ABNORMAL HIGH (ref 70–99)
Glucose-Capillary: 216 mg/dL — ABNORMAL HIGH (ref 70–99)
Glucose-Capillary: 219 mg/dL — ABNORMAL HIGH (ref 70–99)
Glucose-Capillary: 221 mg/dL — ABNORMAL HIGH (ref 70–99)
Glucose-Capillary: 229 mg/dL — ABNORMAL HIGH (ref 70–99)
Glucose-Capillary: 234 mg/dL — ABNORMAL HIGH (ref 70–99)
Glucose-Capillary: 243 mg/dL — ABNORMAL HIGH (ref 70–99)
Glucose-Capillary: 176 mg/dL — ABNORMAL HIGH (ref 70–99)
Glucose-Capillary: 152 mg/dL — ABNORMAL HIGH (ref 70–99)
Glucose-Capillary: 333 mg/dL — ABNORMAL HIGH (ref 70–99)
Glucose-Capillary: 254 mg/dL — ABNORMAL HIGH (ref 70–99)
Glucose-Capillary: 108 mg/dL — ABNORMAL HIGH (ref 70–99)
Glucose-Capillary: 90 mg/dL (ref 70–99)
Glucose-Capillary: 135 mg/dL — ABNORMAL HIGH (ref 70–99)
Glucose-Capillary: 245 mg/dL — ABNORMAL HIGH (ref 70–99)
Glucose-Capillary: 202 mg/dL — ABNORMAL HIGH (ref 70–99)

## 2020-06-21 LAB — BLOOD GAS, ARTERIAL
Acid-base deficit: 7.5 mmol/L — ABNORMAL HIGH (ref 0.0–2.0)
Acid-base deficit: 8.4 mmol/L — ABNORMAL HIGH (ref 0.0–2.0)
Bicarbonate: 19 mmol/L — ABNORMAL LOW (ref 20.0–28.0)
Bicarbonate: 19.2 mmol/L — ABNORMAL LOW (ref 20.0–28.0)
Drawn by: 51425
FIO2: 60
FIO2: 55
MECHVT: 600 mL
O2 Saturation: 93.4 %
O2 Saturation: 86.3 %
PEEP: 10 cmH2O
Patient temperature: 36.3
Patient temperature: 99.1
RATE: 16 resp/min
pCO2 arterial: 42.2 mmHg (ref 32.0–48.0)
pCO2 arterial: 48.7 mmHg — ABNORMAL HIGH (ref 32.0–48.0)
pH, Arterial: 7.27 — ABNORMAL LOW (ref 7.350–7.450)
pH, Arterial: 7.22 — ABNORMAL LOW (ref 7.350–7.450)
pO2, Arterial: 71 mmHg — ABNORMAL LOW (ref 83.0–108.0)
pO2, Arterial: 58.5 mmHg — ABNORMAL LOW (ref 83.0–108.0)

## 2020-06-21 LAB — CBC
HCT: 41.1 % (ref 39.0–52.0)
HCT: 42.3 % (ref 39.0–52.0)
Hemoglobin: 13.4 g/dL (ref 13.0–17.0)
Hemoglobin: 13.9 g/dL (ref 13.0–17.0)
MCH: 32 pg (ref 26.0–34.0)
MCH: 31.7 pg (ref 26.0–34.0)
MCHC: 32.6 g/dL (ref 30.0–36.0)
MCHC: 32.9 g/dL (ref 30.0–36.0)
MCV: 98.1 fL (ref 80.0–100.0)
MCV: 96.6 fL (ref 80.0–100.0)
Platelets: 137 10*3/uL — ABNORMAL LOW (ref 150–400)
Platelets: 170 10*3/uL (ref 150–400)
RBC: 4.19 MIL/uL — ABNORMAL LOW (ref 4.22–5.81)
RBC: 4.38 MIL/uL (ref 4.22–5.81)
RDW: 15.6 % — ABNORMAL HIGH (ref 11.5–15.5)
RDW: 15.6 % — ABNORMAL HIGH (ref 11.5–15.5)
WBC: 12.6 10*3/uL — ABNORMAL HIGH (ref 4.0–10.5)
WBC: 14.2 10*3/uL — ABNORMAL HIGH (ref 4.0–10.5)
nRBC: 0 % (ref 0.0–0.2)
nRBC: 0 % (ref 0.0–0.2)

## 2020-06-21 LAB — BASIC METABOLIC PANEL
Anion gap: 13 (ref 5–15)
BUN: 115 mg/dL — ABNORMAL HIGH (ref 8–23)
CO2: 17 mmol/L — ABNORMAL LOW (ref 22–32)
Calcium: 6.7 mg/dL — ABNORMAL LOW (ref 8.9–10.3)
Chloride: 96 mmol/L — ABNORMAL LOW (ref 98–111)
Creatinine, Ser: 2.37 mg/dL — ABNORMAL HIGH (ref 0.61–1.24)
GFR calc Af Amer: 29 mL/min — ABNORMAL LOW (ref >60)
GFR calc non Af Amer: 25 mL/min — ABNORMAL LOW (ref >60)
Glucose, Bld: 372 mg/dL — ABNORMAL HIGH (ref 70–99)
Potassium: 5.2 mmol/L — ABNORMAL HIGH (ref 3.5–5.1)
Sodium: 126 mmol/L — ABNORMAL LOW (ref 135–145)

## 2020-06-21 LAB — C DIFFICILE (CDIFF) QUICK SCRN (NO PCR REFLEX)
C Diff antigen: POSITIVE — AB
C Diff interpretation: DETECTED
C Diff toxin: POSITIVE — AB

## 2020-06-21 LAB — MRSA PCR SCREENING: MRSA by PCR: NEGATIVE

## 2020-06-21 LAB — PROCALCITONIN: Procalcitonin: 7.22 ng/mL

## 2020-06-21 MED ORDER — SODIUM BICARBONATE 8.4 % IV SOLN
INTRAVENOUS | Status: DC
  Filled 2020-06-21 (×6): qty 150

## 2020-06-21 MED ORDER — QUETIAPINE FUMARATE 100 MG PO TABS
100.0000 mg | ORAL_TABLET | Freq: Two times a day (BID) | ORAL | Status: DC
  Administered 2020-06-21 (×2): 100 mg
  Filled 2020-06-21 (×2): qty 1

## 2020-06-21 MED ORDER — INSULIN REGULAR(HUMAN) IN NACL 100-0.9 UT/100ML-% IV SOLN: INTRAVENOUS | Status: DC

## 2020-06-21 MED ORDER — HYDROCORTISONE NA SUCCINATE PF 100 MG IJ SOLR
50.0000 mg | Freq: Four times a day (QID) | INTRAMUSCULAR | Status: DC
  Administered 2020-06-21 – 2020-06-22 (×5): 50 mg via INTRAVENOUS
  Filled 2020-06-21 (×5): qty 2

## 2020-06-21 MED ORDER — DEXTROSE-NACL 5-0.45 % IV SOLN: INTRAVENOUS | Status: DC

## 2020-06-21 MED ORDER — INSULIN ASPART 100 UNIT/ML ~~LOC~~ SOLN: 9.0000 [IU] | SUBCUTANEOUS | Status: DC

## 2020-06-21 MED ORDER — DEXMEDETOMIDINE HCL IN NACL 200 MCG/50ML IV SOLN
0.0000 ug/kg/h | INTRAVENOUS | Status: DC
  Administered 2020-06-21: 1.2 ug/kg/h via INTRAVENOUS
  Administered 2020-06-21 (×3): 1 ug/kg/h via INTRAVENOUS
  Administered 2020-06-21 (×2): 0.8 ug/kg/h via INTRAVENOUS
  Administered 2020-06-21 – 2020-06-22 (×7): 1 ug/kg/h via INTRAVENOUS
  Filled 2020-06-21 (×12): qty 50

## 2020-06-21 MED ORDER — SODIUM CHLORIDE 0.9 % IV SOLN: INTRAVENOUS | Status: DC

## 2020-06-21 MED ORDER — VANCOMYCIN 50 MG/ML ORAL SOLUTION: 500.0000 mg | Freq: Four times a day (QID) | ORAL | Status: DC

## 2020-06-21 MED ORDER — INSULIN REGULAR(HUMAN) IN NACL 100-0.9 UT/100ML-% IV SOLN
INTRAVENOUS | Status: DC
  Administered 2020-06-21: 14 [IU]/h via INTRAVENOUS
  Administered 2020-06-21: 1.1 [IU]/h via INTRAVENOUS
  Administered 2020-06-22: 2.4 [IU]/h via INTRAVENOUS
  Filled 2020-06-21 (×2): qty 100

## 2020-06-21 MED ORDER — LACTATED RINGERS IV BOLUS
500.0000 mL | Freq: Once | INTRAVENOUS | Status: AC
  Administered 2020-06-21: 500 mL via INTRAVENOUS

## 2020-06-21 MED ORDER — VANCOMYCIN 50 MG/ML ORAL SOLUTION
500.0000 mg | Freq: Four times a day (QID) | ORAL | Status: DC
  Administered 2020-06-21 – 2020-06-22 (×5): 500 mg
  Filled 2020-06-21 (×6): qty 10

## 2020-06-21 MED ORDER — DEXTROSE 50 % IV SOLN: 0.0000 mL | INTRAVENOUS | Status: DC | PRN

## 2020-06-21 MED FILL — Sodium Chloride IV Soln 0.9%: INTRAVENOUS | Qty: 100 | Status: AC

## 2020-06-21 MED FILL — Vasopressin IV Soln 20 Unit/ML (For IV Infusion): INTRAVENOUS | Qty: 1 | Status: AC

## 2020-06-21 NOTE — Progress Notes (Signed)
NAME:  Jim Clarke, MRN:  [id number], DOB:  Feb 19, 1941, LOS: 23 ADMISSION DATE:  06/03/2020, CONSULTATION DATE:  7/23 REFERRING MD:  Bonner Puna, CHIEF COMPLAINT:  Dyspnea   Brief History   79 y/o male admitted with COVID pneumonia on 7/17 after having symptoms for about 10 days.  He was treated with heated high flow, actemra, remdesivir and systemic steroids.  PCCM consulted on 7/23 in the setting of worsening agitated delirium and oxygenation.    Past Medical History  Hyeprtension GERD Hyperlipidemia DM2 Arthritis  Significant Hospital Events   7/17 Admission 7/23 Intubation, paralysed 7/24 Started proning 7/25 Off Levophed 7/26 Off paralytics, proning continued 7/27 vomited while prone 7/28-7/30 repeated aspiration in prone position 7/30 enterobacter pneumonia, pressors briefly 8/2 decreasing sedation, stopping oral sedatives 8/4 change to pressure control for synchrony 8/5: Tolerating pressure support ventilation.  Continuing diuresis, transitioning to Precedex infusion 8/5 nursing staff noting left pupil not reactive. CT head negative  8/6: Tmax 99.3; wbc stable. I&O -1.6 respiratory rate increased, increased work of breathing, requiring increased FiO2 with air trapping and auto PEEP, suspecting having some difficulty transitioning off benzodiazepines, adding Ativan taper 8/8 new fever. Shock req pressors. Re-cultured. Broad spec abx started 8/9 oral vanc for cdiff +   Consults:  PCCM  Procedures:  7/23 ETT >  7/23 Left subclavian central line > 8/3 Radial A line 7/24 >  8/3 PICC >   Significant Diagnostic Tests:    Micro Data:  7/17 SARS COV 2 > positive 7/17 blood culture >  7/30 resp culture > enterobacter  8/8 blood>>> 8/8 resp culture>>> Antimicrobials:  7/17 actemra > 7/18 7/17 remdesivir > 7/21 7/17 methylprednisolone > (completed)   7/31 unasyn> 8/1 8/1 cefepime > 8/7 8/8 vanc 8/8>>> 8/8 zosyn 8/8>> Oral vanc 8/9>>>  Interim history/subjective:    Still on pressors.   Objective   Blood pressure 126/65, pulse (Abnormal) 113, temperature 99.1 F (37.3 C), temperature source Bladder, resp. rate 20, height 5\' 7"  (1.702 m), weight 98.2 kg, SpO2 96 %. CVP:  [0 mmHg-9 mmHg] 7 mmHg  Vent Mode: PRVC FiO2 (%):  [60 %] 60 % Set Rate:  [16 bmp-20 bmp] 20 bmp Vt Set:  [600 mL] 600 mL PEEP:  [10 cmH20] 10 cmH20 Plateau Pressure:  [29 cmH20-37 cmH20] 37 cmH20   Intake/Output Summary (Last 24 hours) at 06/21/2020 0859 Last data filed at 06/21/2020 5329 Gross per 24 hour  Intake 3513.24 ml  Output 725 ml  Net 2788.24 ml   Filed Weights   06/16/20 0331 06/19/20 0312 06/21/20 0357  Weight: 87.2 kg 87.1 kg 98.2 kg    Examination: General critically ill 79 year old white male. Still sedated. RR mid 20s.  HENT NCAT no JVD sclera non-icteric orally intubated Pulm scattered rhonchi rr 27, PIP 15 peep 10/fio2 60% Card RRR abd soft liq stools gu dec UOP concentrated urine Ext anasarca; + pulses.  Neuro sedated   Resolved Hospital Problem list   Enterobacter PNA (completed 7 days cefepime 8/7)   Assessment & Plan:   Acute hypoxic respiratory failure w/ ARDS due to COVID 19 pneumonia HCAP (recurrent) Plan Cont PRVC w/ lung protective strategy  VAP bundle  PAD protocol RASS goal -1 Day 2 zosyn and vanc -->narrow as soon as cultures completed & DC if culture neg especially w/ Cdiff infection  Will need trach-->hopefully we can do this mid week  SIRS/sepsis w/ septic shock in setting of C diff. Colitis  Plan CVP goal 8-12  MAP goal > 65-->titrate norepi  Trend cbc Day 1 oral vanc VT Trend PCTs Stress dose steroids day 1  AKI in setting of sepsis w/ worsening metabolic acidosis (looks primarily NAG) ->likely loosing bicarb from his diarrhea  Plan Repeat Lactic acid Change MIVF to bicarb gtt Strict I&O Serial chemistries  Fluid and electrolyte imbalance: hyperkalemia, hyponatremia  Plan Serial chemistries Change MIVFs to  bicarb as above    Acute metabolic encephalopathy due to ICU delirium-->this is one of the driving factors at this point  Plan Cont precedex and fent gtt Cont seroquel  Cont ativan taper   DM2 with hyperglycemia, complicated by steroids: Remains hyperglycemic Plan Start insulin gtt    Acute anemia, thrombocytopenia likely due to critical illness Plan Trend cbc    Best practice:  Diet: tube feeding Pain/Anxiety/Delirium protocol (if indicated): as above VAP protocol (if indicated): yes DVT prophylaxis: lovenox GI prophylaxis: famoditine Glucose control: as above Mobility: bed rest Code Status: full Family Communication: wife Disposition: remain in ICU  Labs   CBC: Recent Labs  Lab 06/15/20 0330 06/15/20 0330 06/15/20 0521 06/15/20 0521 06/16/20 0410 06/17/20 0405 06/18/20 0551 06/19/20 0228 06/20/20 0400  WBC 3.1*   < > 7.7   < > 8.2 9.7 9.2 7.8 9.4  NEUTROABS 2.6  --  6.3  --   --  8.7*  --   --   --   HGB 4.9*   < > 12.6*   < > 12.2* 11.7* 12.7* 11.7* 12.0*  HCT 16.9*   < > 41.2   < > 38.6* 36.9* 38.1* 36.2* 36.7*  MCV 110.5*   < > 103.3*   < > 101.6* 101.1* 96.9 97.8 97.1  PLT 43*   < > 104*   < > 107* 94* 99* 97* 124*   < > = values in this interval not displayed.    Basic Metabolic Panel: Recent Labs  Lab 06/18/20 0551 06/19/20 0228 06/20/20 0400 06/20/20 2030 06/21/20 0500  NA 138 139 133* 133* 126*  K 3.5 3.8 4.3 4.7 5.2*  CL 100 104 100 101 96*  CO2 29 27 21* 21* 17*  GLUCOSE 237* 194* 331* 293* 372*  BUN 55* 56* 84* 106* 115*  CREATININE 0.97 0.83 1.29* 2.13* 2.37*  CALCIUM 7.7* 7.2* 6.7* 7.2* 6.7*   GFR: Estimated Creatinine Clearance: 28.7 mL/min (A) (by C-G formula based on SCr of 2.37 mg/dL (H)). Recent Labs  Lab 06/17/20 0405 06/18/20 0551 06/19/20 0228 06/20/20 0400 06/20/20 2030 06/21/20 0500  PROCALCITON  --   --   --   --  6.01 7.22  WBC 9.7 9.2 7.8 9.4  --   --   LATICACIDVEN  --   --   --   --  1.9  --     Liver  Function Tests: Recent Labs  Lab 06/15/20 0330 06/15/20 0521 06/17/20 0405 06/18/20 0551 06/19/20 0228  AST 22 27 29 30 29   ALT 18 23 19 23 24   ALKPHOS 52 64 66 73 65  BILITOT 0.5 0.7 0.6 0.6 0.4  PROT 4.2* 5.5* 4.9* 5.4* 5.1*  ALBUMIN 2.0* 2.5* 2.3* 2.3* 2.0*   No results for input(s): LIPASE, AMYLASE in the last 168 hours. No results for input(s): AMMONIA in the last 168 hours.  ABG    Component Value Date/Time   PHART 7.270 (L) 06/21/2020 0158   PCO2ART 42.2 06/21/2020 0158   PO2ART 71.0 (L) 06/21/2020 0158   HCO3 19.0 (L) 06/21/2020 0158  ACIDBASEDEF 7.5 (H) 06/21/2020 0158   O2SAT 93.4 06/21/2020 0158     Coagulation Profile: No results for input(s): INR, PROTIME in the last 168 hours.  Cardiac Enzymes: No results for input(s): CKTOTAL, CKMB, CKMBINDEX, TROPONINI in the last 168 hours.  HbA1C: Hemoglobin A1C  Date/Time Value Ref Range Status  03/31/2020 10:39 AM 7.2 (A) 4.0 - 5.6 % Final  03/04/2018 10:54 AM 7.8  Final   Hgb A1c MFr Bld  Date/Time Value Ref Range Status  05/31/2020 10:49 AM 7.5 (H) 4.8 - 5.6 % Final    Comment:    (NOTE) Pre diabetes:          5.7%-6.4%  Diabetes:              >6.4%  Glycemic control for   <7.0% adults with diabetes     CBG: Recent Labs  Lab 06/20/20 1636 06/20/20 1932 06/20/20 2342 06/21/20 0342 06/21/20 0812  GLUCAP 273* 238* 278* 318* 295*     My cct [place] ACNP-BC [place] Pager # [phone] OR # [phone] if no answer

## 2020-06-21 NOTE — TOC Progression Note (Signed)
Transition of Care [facility]) - Progression Note    Patient Details  Name: XAVION MUSCAT MRN: [id number] Date of Birth: [birth year]  [facility] CM/SW Contact  Leeroy Cha, RN Phone Number: 06/21/2020, 11:15 AM  Clinical Narrative:    Remains on the vent, Iv solu-cortef,+ for c.diff,meds unchanged, being proned for resp effort. Following for progression and toc needs.  Expected Discharge Plan: Home/Self Care Barriers to Discharge: Continued Medical Work up  Expected Discharge Plan and Services Expected Discharge Plan: Home/Self Care   Discharge Planning Services: CM Consult   Living arrangements for the past 2 months: Single Family Home                                       Social Determinants of Health (SDOH) Interventions    Readmission Risk Interventions No flowsheet data found.

## 2020-06-21 NOTE — Progress Notes (Signed)
[place] Progress Note Patient Name: CAS TRACZ DOB: 1941/06/25 MRN: [id number]   Date of Service  06/21/2020  HPI/Events of Note  Hypotension - BP = 96/51 and HR = 123. Patient is on Norepinephrine IV infusion at ceiling dose and Vasopressin at shock dose.  eICU Interventions  Plan: 1. ABG STAT. 2. Increase ceiling on Norepinephrine IV infusion to 60 mcg/min.      Intervention Category Major Interventions: Acid-Base disturbance - evaluation and management;Respiratory failure - evaluation and management  [name] 06/21/2020, 9:46 PM

## 2020-06-21 NOTE — Care Plan (Signed)
Dr. Francie Massing notified of pt's nonsustained run of vtach.

## 2020-06-21 NOTE — Care Plan (Signed)
Pt's family (Wife- Hoyle Sauer, and DaughterAbigail Butts) at bedside. Pt's Hr 160's, BP 70's/30's on max levo gtt, and sat 79%. Family made aware of current supportive therapy and all questions answered about potential outcomes. Wife has chosen to make pt a DNR at this time. Awaiting [facility]Link MD Dr. Oletta Darter to meet with family via camera.

## 2020-06-21 NOTE — Plan of Care (Signed)
Patient seen at bedside, for shock  Suspect Septic shock.  Limited POCUS showed hyperdynamic LV, RV normal in size.  Already on broad spectrum abx.  May be a bit volume down, and also some concern he may be acidotic with his AKI and sepsis.    Plan: - 500cc LR bolus - ABG - add stress dose steroids - test for Cdiff

## 2020-06-21 NOTE — Progress Notes (Signed)
[place] Progress Note Patient Name: Jim Clarke DOB: 01/08/1941 MRN: [id number]   Date of Service  06/21/2020  HPI/Events of Note  Cl diff + toxins. Has fecal management . Sepsis, new fever. Abdomen ok. On pressors.   eICU Interventions  - start Oral vanc/flagyl IV. Consider GI/ID consultation in AM.      Intervention Category Intermediate Interventions: Other:  Elmer Sow 06/21/2020, 5:21 AM

## 2020-06-21 NOTE — Progress Notes (Signed)
[place] Progress Note Patient Name: Jim Clarke DOB: [birth year] MRN: [id number]   Date of Service  06/21/2020  HPI/Events of Note  Jim Clarke has had a prolonged ICU course with little improvement. Discussed GOC with his wife, [name], who voices that she would like to continue the present support her husband is on, however, she would like to make him a limited DNR, No Code Blue, No CPR, No further ACLS drugs and No defibrillation.   eICU Interventions  Will change code status to limited DNR per discussion documented above.     Intervention Category Major Interventions: End of life / care limitation discussion  [name] 06/21/2020, 10:52 PM

## 2020-06-22 DIAGNOSIS — R6521 Severe sepsis with septic shock: Secondary | ICD-10-CM

## 2020-06-22 DIAGNOSIS — A419 Sepsis, unspecified organism: Secondary | ICD-10-CM

## 2020-06-22 DIAGNOSIS — A0472 Enterocolitis due to Clostridium difficile, not specified as recurrent: Secondary | ICD-10-CM

## 2020-06-22 LAB — BLOOD GAS, ARTERIAL
Acid-base deficit: 10.5 mmol/L — ABNORMAL HIGH (ref 0.0–2.0)
Acid-base deficit: 11.5 mmol/L — ABNORMAL HIGH (ref 0.0–2.0)
Bicarbonate: 18.9 mmol/L — ABNORMAL LOW (ref 20.0–28.0)
Bicarbonate: 18.8 mmol/L — ABNORMAL LOW (ref 20.0–28.0)
FIO2: 55
FIO2: 55
O2 Saturation: 67.5 %
O2 Saturation: 58.1 %
Patient temperature: 100.2
Patient temperature: 98.6
pCO2 arterial: 59.1 mmHg — ABNORMAL HIGH (ref 32.0–48.0)
pCO2 arterial: 61.6 mmHg — ABNORMAL HIGH (ref 32.0–48.0)
pH, Arterial: 7.138 — CL (ref 7.350–7.450)
pH, Arterial: 7.112 — CL (ref 7.350–7.450)
pO2, Arterial: 45.7 mmHg — ABNORMAL LOW (ref 83.0–108.0)
pO2, Arterial: 39 mmHg — CL (ref 83.0–108.0)

## 2020-06-22 LAB — GLUCOSE, CAPILLARY
Glucose-Capillary: 172 mg/dL — ABNORMAL HIGH (ref 70–99)
Glucose-Capillary: 191 mg/dL — ABNORMAL HIGH (ref 70–99)
Glucose-Capillary: 157 mg/dL — ABNORMAL HIGH (ref 70–99)
Glucose-Capillary: 122 mg/dL — ABNORMAL HIGH (ref 70–99)
Glucose-Capillary: 98 mg/dL (ref 70–99)
Glucose-Capillary: 136 mg/dL — ABNORMAL HIGH (ref 70–99)
Glucose-Capillary: 111 mg/dL — ABNORMAL HIGH (ref 70–99)
Glucose-Capillary: 117 mg/dL — ABNORMAL HIGH (ref 70–99)
Glucose-Capillary: 102 mg/dL — ABNORMAL HIGH (ref 70–99)
Glucose-Capillary: 70 mg/dL (ref 70–99)

## 2020-06-22 LAB — CBC
HCT: 43.1 % (ref 39.0–52.0)
Hemoglobin: 14 g/dL (ref 13.0–17.0)
MCH: 31.6 pg (ref 26.0–34.0)
MCHC: 32.5 g/dL (ref 30.0–36.0)
MCV: 97.3 fL (ref 80.0–100.0)
Platelets: 173 10*3/uL (ref 150–400)
RBC: 4.43 MIL/uL (ref 4.22–5.81)
RDW: 16 % — ABNORMAL HIGH (ref 11.5–15.5)
WBC: 9.5 10*3/uL (ref 4.0–10.5)
nRBC: 1.7 % — ABNORMAL HIGH (ref 0.0–0.2)

## 2020-06-22 LAB — COMPREHENSIVE METABOLIC PANEL
ALT: 53 U/L — ABNORMAL HIGH (ref 0–44)
AST: 94 U/L — ABNORMAL HIGH (ref 15–41)
Albumin: 1.3 g/dL — ABNORMAL LOW (ref 3.5–5.0)
Alkaline Phosphatase: 68 U/L (ref 38–126)
Anion gap: 14 (ref 5–15)
BUN: 137 mg/dL — ABNORMAL HIGH (ref 8–23)
CO2: 20 mmol/L — ABNORMAL LOW (ref 22–32)
Calcium: 6.9 mg/dL — ABNORMAL LOW (ref 8.9–10.3)
Chloride: 100 mmol/L (ref 98–111)
Creatinine, Ser: 3.27 mg/dL — ABNORMAL HIGH (ref 0.61–1.24)
GFR calc Af Amer: 20 mL/min — ABNORMAL LOW (ref >60)
GFR calc non Af Amer: 17 mL/min — ABNORMAL LOW (ref >60)
Glucose, Bld: 115 mg/dL — ABNORMAL HIGH (ref 70–99)
Potassium: 5.7 mmol/L — ABNORMAL HIGH (ref 3.5–5.1)
Sodium: 134 mmol/L — ABNORMAL LOW (ref 135–145)
Total Bilirubin: 0.6 mg/dL (ref 0.3–1.2)
Total Protein: 4.1 g/dL — ABNORMAL LOW (ref 6.5–8.1)

## 2020-06-22 LAB — PROCALCITONIN: Procalcitonin: 13.71 ng/mL

## 2020-06-22 MED ORDER — VANCOMYCIN HCL IN DEXTROSE 1-5 GM/200ML-% IV SOLN: 1000.0000 mg | INTRAVENOUS | Status: DC

## 2020-06-23 MED FILL — Sodium Chloride IV Soln 0.9%: INTRAVENOUS | Qty: 100 | Status: AC

## 2020-06-23 MED FILL — Vasopressin IV Soln 20 Unit/ML (For IV Infusion): INTRAVENOUS | Qty: 1 | Status: AC

## 2020-06-25 ENCOUNTER — Telehealth: Payer: Self-pay | Admitting: Family Medicine

## 2020-06-25 LAB — CULTURE, BLOOD (ROUTINE X 2)
Culture: NO GROWTH
Special Requests: ADEQUATE

## 2020-06-25 LAB — CULTURE, RESPIRATORY W GRAM STAIN

## 2020-06-25 NOTE — Telephone Encounter (Signed)
Sympathy card sent 

## 2020-07-14 NOTE — Care Plan (Signed)
Personal belongings sent home with wife and daughter.

## 2020-07-14 NOTE — Progress Notes (Signed)
NAME:  Jim Clarke, MRN:  [id number], DOB:  Apr 11, 1941, LOS: 24 ADMISSION DATE:  06/08/2020, CONSULTATION DATE:  7/23 REFERRING MD:  Bonner Puna, CHIEF COMPLAINT:  Dyspnea   Brief History   79 y/o male admitted with COVID pneumonia on 7/17 after having symptoms for about 10 days.  He was treated with heated high flow, actemra, remdesivir and systemic steroids.  PCCM consulted on 7/23 in the setting of worsening agitated delirium and oxygenation.    Past Medical History  Hyeprtension GERD Hyperlipidemia DM2 Arthritis  Significant Hospital Events   7/17 Admission 7/23 Intubation, paralysed 7/24 Started proning 7/25 Off Levophed 7/26 Off paralytics, proning continued 7/27 vomited while prone 7/28-7/30 repeated aspiration in prone position 7/30 enterobacter pneumonia, pressors briefly 8/2 decreasing sedation, stopping oral sedatives 8/4 change to pressure control for synchrony 8/5: Tolerating pressure support ventilation.  Continuing diuresis, transitioning to Precedex infusion 8/5 nursing staff noting left pupil not reactive. CT head negative  8/6: Tmax 99.3; wbc stable. I&O -1.6 respiratory rate increased, increased work of breathing, requiring increased FiO2 with air trapping and auto PEEP, suspecting having some difficulty transitioning off benzodiazepines, adding Ativan taper 8/8 new fever. Shock req pressors. Re-cultured. Broad spec abx started 8/9 oral vanc for cdiff + --> became progressively hypotensive over the afternoon hours requiring escalating vasoactive drips.By 10 PM patient in multiorgan failure, family at bedside, no escalation of care, made DNR 8/10: Unresponsive, on high-dose pressors remaining hypotensive, cool and mottled on exam Consults:  PCCM  Procedures:  7/23 ETT >  7/23 Left subclavian central line > 8/3 Radial A line 7/24 >  8/3 PICC >   Significant Diagnostic Tests:    Micro Data:  7/17 SARS COV 2 > positive 7/17 blood culture >  7/30 resp culture  > enterobacter  8/8 blood>>> 8/8 resp culture>>> Antimicrobials:  7/17 actemra > 7/18 7/17 remdesivir > 7/21 7/17 methylprednisolone > (completed)   7/31 unasyn> 8/1 8/1 cefepime > 8/7 8/8 vanc 8/8>>> 8/8 zosyn 8/8>> Oral vanc 8/9>>>  Interim history/subjective:  Cool and mottled.  Unresponsive Objective   Blood pressure (Abnormal) 99/43, pulse (Abnormal) 124, temperature 100.2 F (37.9 C), temperature source Bladder, resp. rate (Abnormal) 28, height 5\' 7"  (1.702 m), weight 104.3 kg, SpO2 (Abnormal) 64 %. CVP:  [3 mmHg-5 mmHg] 5 mmHg  Vent Mode: PCV FiO2 (%):  [55 %-60 %] 55 % Set Rate:  [20 bmp-30 bmp] 30 bmp Vt Set:  [600 mL] 600 mL PEEP:  [10 cmH20-12 cmH20] 12 cmH20 Plateau Pressure:  [14 cmH20-32 cmH20] 32 cmH20   Intake/Output Summary (Last 24 hours) at 2020/06/25 0844 Last data filed at 2020-06-25 0700 Gross per 24 hour  Intake 5078.71 ml  Output 630 ml  Net 4448.71 ml   Filed Weights   06/19/20 0312 06/21/20 0357 2020/06/25 0500  Weight: 87.1 kg 98.2 kg 104.3 kg    Examination:  General: This is a 79 year old male patient he is actively dying in spite of maximum ventilatory and vasoactive drip support he is currently unresponsive and mottled Pulmonary: Tachypneic, currently on PEEP twelve, FiO2 55% scattered rhonchi positive accessory use pulse oximetry ranging in the mid 60s Cardiac: Tachycardic rhythm Extremities are cool, mottled with diffuse anasarca Abdomen distended firm hypoactive however still continues to have liquid stool Neuro unresponsive GU minimal urine output  Resolved Hospital Problem list   Enterobacter PNA (completed 7 days cefepime 8/7)   Assessment & Plan:   Acute hypoxic respiratory failure w/ ARDS due to COVID 19 pneumonia  HCAP (recurrent) SIRS/sepsis w/ septic shock in setting of C diff. Colitis  AKI in setting of sepsis w/ worsening metabolic acidosis (looks primarily NAG) Fluid and electrolyte imbalance: hyperkalemia, hyponatremia   Acute metabolic encephalopathy due to ICU delirium-->this is one of the driving factors at this point  DM2 with hyperglycemia, complicated by steroids: Remains hyperglycemic Acute anemia, thrombocytopenia likely due to critical illness  Discussion Refractory shock with multiorgan failure secondary to C. difficile colitis superimposed on underlying respiratory failure following COVID-19 pneumonia, ARDS, and ventilator dependence with failure to wean.  Now has recurrent acute renal failure, persistent acidosis, and progressive hyperkalemia.  On exam he is cool and mottled.  Unfortunately I do not think he will survive this illness.  Family made him DO NOT RESUSCITATE last night with no escalation of care.  I have nothing else to offer him at this point.  He is declining in spite of aggressive medical therapies.  Plan Discontinue all therapies that do not contribute directly to comfort Continue fentanyl drip As needed lorazepam Family is now at bedside we will remove endotracheal tube when they are ready  Transitioning to full comfort today   Best practice:  Diet: tube feeding Pain/Anxiety/Delirium protocol (if indicated): as above VAP protocol (if indicated): yes DVT prophylaxis: lovenox GI prophylaxis: famoditine Glucose control: as above Mobility: bed rest Code Status: DNR and full comfort.  Family Communication: wife Disposition: remain in ICU  Labs   CBC: Recent Labs  Lab 06/17/20 0405 06/18/20 0551 06/19/20 0228 06/20/20 0400 06/21/20 1127 06/21/20 1557 07/03/20 0455  WBC 9.7   < > 7.8 9.4 12.6* 14.2* 9.5  NEUTROABS 8.7*  --   --   --   --   --   --   HGB 11.7*   < > 11.7* 12.0* 13.4 13.9 14.0  HCT 36.9*   < > 36.2* 36.7* 41.1 42.3 43.1  MCV 101.1*   < > 97.8 97.1 98.1 96.6 97.3  PLT 94*   < > 97* 124* 137* 170 173   < > = values in this interval not displayed.    Basic Metabolic Panel: Recent Labs  Lab 06/20/20 2030 06/21/20 0500 06/21/20 1056 06/21/20 1557  2020-07-03 0455  NA 133* 126* 131* 134* 134*  K 4.7 5.2* 5.7* 5.6* 5.7*  CL 101 96* 101 103 100  CO2 21* 17* 17* 20* 20*  GLUCOSE 293* 372* 455* 321* 115*  BUN 106* 115* 110* 136* 137*  CREATININE 2.13* 2.37* 2.55* 2.74* 3.27*  CALCIUM 7.2* 6.7* 6.8* 7.0* 6.9*   GFR: Estimated Creatinine Clearance: 21.4 mL/min (A) (by C-G formula based on SCr of 3.27 mg/dL (H)). Recent Labs  Lab 06/20/20 0400 06/20/20 2030 06/21/20 0500 06/21/20 1127 06/21/20 1557 2020/07/03 0455  PROCALCITON  --  6.01 7.22  --   --  13.71  WBC 9.4  --   --  12.6* 14.2* 9.5  LATICACIDVEN  --  1.9  --   --   --   --     Liver Function Tests: Recent Labs  Lab 06/18/20 0551 06/19/20 0228 06/21/20 1056 06/21/20 1557 2020/07/03 0455  AST 30 29 46* 48* 94*  ALT 23 24 40 42 53*  ALKPHOS 73 65 85 80 68  BILITOT 0.6 0.4 0.6 0.6 0.6  PROT 5.4* 5.1* 4.5* 4.7* 4.1*  ALBUMIN 2.3* 2.0* 1.4* 1.6* 1.3*   No results for input(s): LIPASE, AMYLASE in the last 168 hours. No results for input(s): AMMONIA in the last 168 hours.  ABG    Component Value Date/Time   PHART 7.112 (LL) 15-Jul-2020 0741   PCO2ART 61.6 (H) 15-Jul-2020 0741   PO2ART 39.0 (LL) 2020/07/15 0741   HCO3 18.8 (L) 07/15/20 0741   ACIDBASEDEF 11.5 (H) 2020-07-15 0741   O2SAT 58.1 15-Jul-2020 0741     Coagulation Profile: No results for input(s): INR, PROTIME in the last 168 hours.  Cardiac Enzymes: No results for input(s): CKTOTAL, CKMB, CKMBINDEX, TROPONINI in the last 168 hours.  HbA1C: Hemoglobin A1C  Date/Time Value Ref Range Status  03/31/2020 10:39 AM 7.2 (A) 4.0 - 5.6 % Final  03/04/2018 10:54 AM 7.8  Final   Hgb A1c MFr Bld  Date/Time Value Ref Range Status  05/31/2020 10:49 AM 7.5 (H) 4.8 - 5.6 % Final    Comment:    (NOTE) Pre diabetes:          5.7%-6.4%  Diabetes:              >6.4%  Glycemic control for   <7.0% adults with diabetes     CBG: Recent Labs  Lab 2020/07/15 0403 07/15/20 0507 07-15-20 0602 Jul 15, 2020 0656  07-15-2020 0832  GLUCAP 98 136* 111* 117* 102*    My cct 45 minutes  Erick Colace ACNP-BC [place] Pager # [phone] OR # [phone] if no answer

## 2020-07-14 NOTE — TOC Transition Note (Signed)
Transition of Care [facility]) - CM/SW Discharge Note   Patient Details  Name: MALAKAI SCHOENHERR MRN: [id number] Date of Birth: [birth year]  Transition of Care [facility]) CM/SW Contact:  Leeroy Cha, RN Phone Number: 2020-07-06, 3:22 PM   Clinical Narrative:    expired   Final next level of care: Expired Barriers to Discharge: Barriers Resolved   Patient Goals and CMS Choice Patient states their goals for this hospitalization and ongoing recovery are:: to go home CMS Medicare.gov Compare Post Acute Care list provided to:: Patient    Discharge Placement                       Discharge Plan and Services   Discharge Planning Services: CM Consult                                 Social Determinants of Health (SDOH) Interventions     Readmission Risk Interventions No flowsheet data found.

## 2020-07-14 NOTE — Procedures (Signed)
Extubation Procedure Note  Patient Details:   Name: Jim Clarke DOB: [birth year] MRN: [id number]   Airway Documentation:    Vent end date: July 18, 2020 Vent end time: 1329   Evaluation  O2 sats: N/A Complications: none Patient tolerated procedure well. Bilateral Breath Sounds: Diminished      Sidharth Leverette 2020-07-18, 1:30 PM

## 2020-07-14 NOTE — Progress Notes (Signed)
°   Jul 07, 2020 1200  Clinical Encounter Type  Visited With Family  Visit Type Follow-up;Psychological support;Spiritual support  Referral From Nurse  Consult/Referral To Chaplain  Spiritual Encounters  Spiritual Needs Emotional;Grief support;Other (Comment) (Spiritual Care Conversation/Support)  Stress Factors  Patient Stress Factors Not reviewed  Family Stress Factors Health changes;Major life changes   I spoke with Jim Clarke' daughter as a follow up from previous visits with her. She has been having a difficult day with her father's condition and her youngest child not being able to be with his grandfather at the hospital. I provided spiritual support and a prayer shawl for the patient. I will continue to provide Spiritual Support for this family during Jim Clarke' stay with [place].   Please, contact Spiritual Care for further assistance.   Chaplain Shanon Ace M.Div., [facility]

## 2020-07-14 NOTE — Progress Notes (Signed)
Pt time of death at 1336. Declared by this RN and Fredric Mare, RN. Family at bedside. Pt resting comfortably on fentanyl drip at time of death. Belongings returned to wife. CDS notified. Pt not a candidate for donation.

## 2020-07-14 NOTE — Progress Notes (Signed)
[place] Progress Note Patient Name: FINDLEY VI DOB: 1941-07-07 MRN: [id number]   Date of Service  06-24-20  HPI/Events of Note  ABG on 55%/PRVC 20/TV 600/P 12 = 7.138/59.1/45.7.  eICU Interventions  Plan: 1. Increase PRVC rate to 30. 2. Increase PEEP to 12. 3. Repeat ABG at 7:30 AM.     Intervention Category Major Interventions: Respiratory failure - evaluation and management;Acid-Base disturbance - evaluation and management  Othmar Ringer [place] 06/24/2020, 5:54 AM

## 2020-07-14 NOTE — TOC Progression Note (Addendum)
Transition of Care [facility]) - Progression Note    Patient Details  Name: Jim Clarke MRN: [id number] Date of Birth: [birth year]  Transition [facility] Care [facility]) CM/SW Contact  Leeroy Cha, RN Phone Number: 06-27-2020, 9:37 AM  Clinical Narrative:    Very little change in condition wife would like to continue aggressive care but did changed patient to a partial code,no cpr . Patient transitioning to comfort care today when family is ready.  Expected Discharge Plan: Home/Self Care Barriers to Discharge: Continued Medical Work up  Expected Discharge Plan and Services Expected Discharge Plan: Home/Self Care   Discharge Planning Services: CM Consult   Living arrangements for the past 2 months: Single Family Home                                       Social Determinants of Health (SDOH) Interventions    Readmission Risk Interventions No flowsheet data found.

## 2020-07-14 NOTE — Discharge Summary (Signed)
DEATH SUMMARY   Patient Details  Name: Jim Clarke MRN: [id number] DOB: 06-04-1941  Admission/Discharge Information   Admit Date:  06-12-20  Date of Death: Date of Death: 07/06/2020  Time of Death: Time of Death: 02-17-35  Length of Stay: 02/18/23  Referring Physician: Denita Lung, MD   Reason(s) for Hospitalization  Patient admitted with Covid pneumonia on 06/13/2023  Diagnoses  Preliminary cause of death: COVID-19 pneumonia Secondary Diagnoses (including complications and co-morbidities):  Principal Problem:   Acute respiratory disease due to COVID-19 virus Active Problems:   Hypertension associated with diabetes ([place])   Type 2 diabetes with complication ([place])   Gastroesophageal reflux disease without esophagitis   Acute hypoxemic respiratory failure ([place])   Malnutrition of moderate degree   Acute encephalopathy   C. difficile colitis   Septic shock [facility])   [place] Hospital Course (including significant findings, care, treatment, and services provided and events leading to death)  Jim Clarke is a 79 y.o. year old male who was admitted on 13-Jun-2023 for symptoms Of shortness of breath, cough.  Diagnosed with Covid infection.  He had malaise, fevers, coughing and shortness of breath Went to the hospital for monoclonal antibody infusion and was found to be very hypoxemic and was sent to the hospital.  Started on treatment for his Covid pneumonia, deteriorated and required intubation on 7/23, required proning. Patient continued to deteriorate despite aggressive care.  Developed multiorgan dysfunction and despite multiple vasopressors and continuing ventilator support he deteriorated and succumbed to his illness on 2020/07/06   Pertinent Labs and Studies  Significant Diagnostic Studies DG Abd 1 View  Result Date: 06/08/2020 CLINICAL DATA:  Orogastric tube placement. EXAM: ABDOMEN - 1 VIEW COMPARISON:  Chest radiograph earlier this day. FINDINGS: Tip and side port of the enteric tube below  the diaphragm in the stomach. Nonobstructive upper abdominal bowel gas pattern. Patchy bilateral lung opacities as seen on chest radiograph earlier today. IMPRESSION: Tip and side port of the enteric tube below the diaphragm in the stomach. Electronically Signed   By: Keith Rake M.D.   On: 06/08/2020 15:45   DG Abd 1 View  Result Date: 06/04/2020 CLINICAL DATA:  OG tube placement. EXAM: ABDOMEN - 1 VIEW COMPARISON:  No prior. FINDINGS: OG tube noted with tip and side hole over the stomach. No bowel distention noted. Prominent calcific density noted over the right abdomen. This could represent a phlebolith. This could represent calcified lymph node. Gallstone or right renal stone cannot be excluded. Thoracolumbar spine degenerative change. Central line and stable position. Bilateral pulmonary infiltrates again noted. IMPRESSION: 1. OG tube noted with tip and side hole over the stomach. No bowel distention. 2. Central line stable position. Bilateral pulmonary infiltrates again noted. Electronically Signed   By: Marcello Moores  Register   On: 06/04/2020 05:51   CT HEAD WO CONTRAST  Result Date: 06/17/2020 CLINICAL DATA:  Provided history: Mental status change, CNS infection suspected, heavily sedated on ventilation, now unequal pupils, COVID positive. Non reactive left pupil. EXAM: CT HEAD WITHOUT CONTRAST TECHNIQUE: Contiguous axial images were obtained from the base of the skull through the vertex without intravenous contrast. COMPARISON:  No pertinent prior exams are available for comparison. FINDINGS: Brain: Mild generalized parenchymal atrophy. There is no acute intracranial hemorrhage. No demarcated cortical infarct is identified. No extra-axial fluid collection. No evidence of intracranial mass. No midline shift. Vascular: No hyperdense vessel. Skull: Normal. Negative for fracture or focal lesion. Sinuses/Orbits: Visualized orbits show no acute finding. Aplastic  frontal sinuses. Pansinusitis. Most notably  there is severe mucosal thickening and frothy secretion within the sphenoid sinuses and moderate mucosal thickening and frothy secretions within the left maxillary sinus. Bilateral mastoid effusions. Other: Partially imaged support tubes. IMPRESSION: No CT evidence of acute intracranial abnormality. Mild generalized parenchymal atrophy of the brain. Pansinusitis in the setting of life-support tubes as described. Electronically Signed   By: Kellie Simmering DO   On: 06/17/2020 17:31   DG CHEST PORT 1 VIEW  Result Date: 06/20/2020 CLINICAL DATA:  fever EXAM: PORTABLE CHEST 1 VIEW COMPARISON:  Radiograph 06/19/2020 FINDINGS: Endotracheal tube and PICC line unchanged. Stable cardiac silhouette. There is patchy bilateral airspace disease not changed from prior. Low lung volumes. No pneumothorax. IMPRESSION: 1. Multifocal lower lobe pneumonia not changed from prior. 2. Stable support apparatus. 3. Low lung volumes. Electronically Signed   By: Suzy Bouchard M.D.   On: 06/20/2020 20:52   DG Chest Port 1 View  Result Date: 06/19/2020 CLINICAL DATA:  Acute respiratory failure EXAM: PORTABLE CHEST 1 VIEW COMPARISON:  06/18/2020 FINDINGS: Endotracheal tube terminates 4 cm above the carina. Right arm PICC terminates the cavoatrial junction. Multifocal patchy opacities in the right upper lobe, lingula, and bilateral lower lobes, suspicious for multifocal pneumonia. Appearance is grossly unchanged. No pleural effusion or pneumothorax. The heart is normal in size. IMPRESSION: Multifocal pneumonia, grossly unchanged. Endotracheal tube terminates 4 cm above the carina. Stable right arm PICC. Electronically Signed   By: Julian Hy M.D.   On: 06/19/2020 07:09   DG Chest Port 1 View  Result Date: 06/18/2020 CLINICAL DATA:  COVID.  Respiratory failure. EXAM: PORTABLE CHEST 1 VIEW COMPARISON:  06/17/2020. FINDINGS: Endotracheal tube, feeding tube, right PICC line in stable position. Heart size stable. Diffuse multifocal  bilateral pulmonary infiltrates again noted without interim change. Low lung volumes with basilar atelectasis. No pleural effusion or pneumothorax. IMPRESSION: 1.  Lines and tubes stable position. 2. Diffuse multifocal bilateral pulmonary infiltrates again noted without interim change. Low lung volumes with bibasilar atelectasis. Electronically Signed   By: Marcello Moores  Register   On: 06/18/2020 06:49   DG Chest Port 1 View  Result Date: 06/17/2020 CLINICAL DATA:  Hypoxia EXAM: PORTABLE CHEST 1 VIEW COMPARISON:  June 16, 2020 FINDINGS: Endotracheal tube tip is 2.3 cm above the carina. Feeding tube tip is below the diaphragm. Central catheter tip is in the right atrium, stable. There is ill-defined airspace opacity in the left mid lung region as well as in the lung base regions bilaterally, minimally increased in the left mid lung and stable in the bases. Heart size and pulmonary vascularity are within normal limits. There is aortic atherosclerosis. No bone lesions. IMPRESSION: Tube and catheter positions as described without pneumothorax. Patchy airspace opacity bilaterally, slightly increased in the left mid lung and otherwise stable. Appearance is most indicative of multifocal pneumonia. Stable cardiac silhouette. Aortic Atherosclerosis (ICD10-I70.0). Electronically Signed   By: Lowella Grip III M.D.   On: 06/17/2020 07:56   DG Chest Port 1 View  Result Date: 06/16/2020 CLINICAL DATA:  Hypoxia EXAM: PORTABLE CHEST 1 VIEW COMPARISON:  June 15, 2020 FINDINGS: Endotracheal tube tip is 2.4 cm above the carina. Nasogastric tube tip and side port are below the diaphragm. Central catheter tip is in the right atrium, slightly beyond the cavoatrial junction. No pneumothorax. There is ill-defined airspace opacity in both lower lung regions. Heart size and pulmonary vascular normal. No adenopathy. No bone lesions. IMPRESSION: Tube and catheter positions as described without  pneumothorax. Persistent patchy airspace  opacity in the lung bases. Suspect multifocal pneumonia as most likely etiology. Stable cardiac silhouette. Appearance similar to 1 day prior. Electronically Signed   By: Lowella Grip III M.D.   On: 06/16/2020 08:19   DG Chest Port 1 View  Result Date: 06/15/2020 CLINICAL DATA:  Respiratory failure. EXAM: PORTABLE CHEST 1 VIEW COMPARISON:  06/14/2020. FINDINGS: Endotracheal tube, NG tube, left subclavian line in stable position. Heart size normal. Progressive bilateral pulmonary infiltrates. No pleural effusion or pneumothorax. IMPRESSION: 1.  Lines and tubes in stable position. 2.  Progressive bilateral pulmonary infiltrates. Electronically Signed   By: Marcello Moores  Register   On: 06/15/2020 06:01   DG Chest Port 1 View  Result Date: 06/14/2020 CLINICAL DATA:  Acute respiratory failure EXAM: PORTABLE CHEST 1 VIEW COMPARISON:  06/12/2020 FINDINGS: Left central venous catheter, endotracheal tube, and enteric tube are unchanged in position. Shallow inspiration with patchy airspace infiltrates in both lungs, more prominent in the bases. Similar appearance to previous study. No effusions. Calcification of the aorta. IMPRESSION: Shallow inspiration with patchy bilateral airspace infiltrates, more prominent in the bases. Similar appearance to previous study. Electronically Signed   By: Lucienne Capers M.D.   On: 06/14/2020 06:00   DG Chest Port 1 View  Result Date: 06/12/2020 CLINICAL DATA:  Acute respiratory failure. EXAM: PORTABLE CHEST 1 VIEW COMPARISON:  06/11/2020 FINDINGS: 0510 hours. Endotracheal tube tip is 1.5 cm above the base of the carina. The NG tube passes into the stomach although the distal tip position is not included on the film. Left subclavian central line tip overlies the mid to distal SVC level. Diffuse interstitial and left greater than right diffuse airspace disease is similar to prior. No substantial pleural effusion. IMPRESSION: No substantial interval change in exam. Electronically  Signed   By: Misty Stanley M.D.   On: 06/12/2020 10:17   DG Chest Port 1 View  Result Date: 06/11/2020 CLINICAL DATA:  Hypoxia EXAM: PORTABLE CHEST 1 VIEW COMPARISON:  June 10, 2020 FINDINGS: Endotracheal tube tip is 4.4 cm above the carina. Nasogastric tube tip and side port are in the stomach. Central catheter tip is in the superior vena cava. No pneumothorax evident. There is bibasilar atelectasis. There is ill-defined interstitial and alveolar opacity throughout much of the left lung. Heart size and pulmonary vascularity are normal. No adenopathy. No bone lesions. IMPRESSION: Tube and catheter positions as described without pneumothorax. Bibasilar atelectasis. Ill-defined opacity throughout much of the left lung may be indicative of developing pneumonia or possible aspiration. Asymmetric pulmonary edema is a differential consideration. Cardiac silhouette stable. Electronically Signed   By: Lowella Grip III M.D.   On: 06/11/2020 06:00   DG Chest Port 1 View  Result Date: 06/10/2020 CLINICAL DATA:  Respiratory failure EXAM: PORTABLE CHEST 1 VIEW COMPARISON:  06/09/2020. FINDINGS: Endotracheal tube is been advanced, its tip is 2 cm above the carina. NG tube stable position. Left subclavian line in stable position. Heart size stable. Significant progressive of bilateral interstitial prominence consistent with pneumonitis and or interstitial edema. No pleural effusion or pneumothorax. IMPRESSION: 1. Interim of endotracheal tube, its tip is 2 cm above the carina. NG tube and left subclavian line in stable position. 2. Significant progressive of bilateral interstitial prominence consistent with pneumonitis and or interstitial edema. Electronically Signed   By: Marcello Moores  Register   On: 06/10/2020 06:57   DG Chest Port 1 View  Result Date: 06/09/2020 CLINICAL DATA:  Hypoxia EXAM: PORTABLE CHEST 1 VIEW  COMPARISON:  June 08, 2020 FINDINGS: Endotracheal tube tip is 9.0 cm above the carina with pain  endotracheal tube at the T2 level. Nasogastric tube tip and side port are below the diaphragm. No pneumothorax. There is airspace opacity in the left lower lung region. There is mild right base atelectasis. Lungs elsewhere clear. Heart size and pulmonary vascularity normal. No adenopathy. No bone lesions. There is aortic atherosclerosis. There is degenerative change in the left shoulder. IMPRESSION: Tube positions as described without pneumothorax. Endotracheal tube is somewhat superiorly positioned; it may be reasonable to consider advancing endotracheal tube 4-5 cm. Ill-defined opacity left lower lung region, likely due to developing pneumonia. Mild right base atelectasis. Lungs elsewhere clear. Cardiac silhouette normal.  Aortic Atherosclerosis (ICD10-I70.0). Electronically Signed   By: Lowella Grip III M.D.   On: 06/09/2020 07:13   DG CHEST PORT 1 VIEW  Result Date: 06/08/2020 CLINICAL DATA:  Hypoxia. EXAM: PORTABLE CHEST 1 VIEW COMPARISON:  Chest x-ray 06/06/2020. FINDINGS: Endotracheal tube and left subclavian line stable position. NG tube tip noted just below left hemidiaphragm. Advancement of approximately 10 cm suggested. Persistent diffuse bilateral pulmonary infiltrates/edema, left side greater than right. No interim change. Stable left-sided pleural thickening. No pleural effusion or pneumothorax. IMPRESSION: 1. NG tube tip noted just below left hemidiaphragm. Advancement of approximately 10 cm suggested. Endotracheal tube and left subclavian line stable position. 2. Diffuse bilateral pulmonary infiltrates, left side greater than right, again noted. Similar findings on prior exam. Electronically Signed   By: [facility]   On: 06/08/2020 06:32   DG Chest Port 1 View  Result Date: 06/06/2020 CLINICAL DATA:  Acute respiratory failure. EXAM: PORTABLE CHEST 1 VIEW COMPARISON:  06/05/2020 FINDINGS: ETT tip is stable approximately 5.9 cm above the carina. Left subclavian central venous  catheter tip is in the cavoatrial junction. Stable nasogastric tube. Normal heart size. Increase in right pleural effusion. Interval progression of diffuse hazy lung opacities within the left lung with new patchy opacities in the right lung base. IMPRESSION: 1. Increase in right pleural effusion. 2. Worsening aeration to the left lung and right lung base. Electronically Signed   By: Kerby Moors M.D.   On: 06/06/2020 06:54   DG Chest Port 1 View  Result Date: 06/05/2020 CLINICAL DATA:  Acute respiratory failure. EXAM: PORTABLE CHEST 1 VIEW COMPARISON:  06/04/2020 FINDINGS: ET tube tip is above the carina. There is a left subclavian central venous catheter with tip at the SVC. Nasogastric tube is stable in position with side port below GE junction. Right upper lobe, left midlung and left lower lobe airspace opacities are again noted and appear unchanged. IMPRESSION: 1. No change in bilateral pulmonary opacities. 2. Stable support apparatus. Electronically Signed   By: Kerby Moors M.D.   On: 06/05/2020 06:48   DG CHEST PORT 1 VIEW  Result Date: 06/04/2020 CLINICAL DATA:  Endotracheal tube repositioning.  COVID-19 positive. EXAM: PORTABLE CHEST 1 VIEW COMPARISON:  06/04/2020 at 2:19 a.m. FINDINGS: The endotracheal tube has been slightly retracted and terminates 3-3.5 cm above the carina. A left subclavian catheter terminates over the lower SVC. The cardiomediastinal silhouette is unchanged with normal heart size. Patchy opacities throughout both lungs including peripheral opacities in the left greater than right mid lungs have not significantly changed. No sizable pleural effusion or pneumothorax is identified. IMPRESSION: 1. Interval retraction of the endotracheal tube. 2. Unchanged bilateral lung opacities compatible with pneumonia. Electronically Signed   By: Logan Bores M.D.   On: 06/04/2020 04:21  DG CHEST PORT 1 VIEW  Result Date: 06/04/2020 CLINICAL DATA:  Endotracheal tube placement EXAM:  PORTABLE CHEST 1 VIEW COMPARISON:  X-ray 1 day prior FINDINGS: The endotracheal tube terminates approximately 2 cm above the carina. The left subclavian central venous catheter tip projects over the cavoatrial junction. There is no evidence for pneumothorax. Again noted are pulmonary opacities bilaterally which have improved slightly from the prior study. This may be in part due to improved lung volumes. There may be a small left-sided pleural effusion. IMPRESSION: 1. Lines and tubes as above. 2. No pneumothorax. 3. Persistent but improved bilateral pulmonary opacities as compared to prior study. Electronically Signed   By: Constance Holster M.D.   On: 06/04/2020 02:37   DG CHEST PORT 1 VIEW  Result Date: 06/03/2020 CLINICAL DATA:  COVID. EXAM: PORTABLE CHEST 1 VIEW COMPARISON:  06/10/2020. FINDINGS: Heart size stable. Low lung volumes. Diffuse severe bilateral pulmonary infiltrates, progressed from prior exam. No pleural effusion or pneumothorax. Degenerative changes scoliosis thoracic spine. No acute bony abnormality IMPRESSION: Low lung volumes. Diffuse severe bilateral pulmonary infiltrates, progressed from prior exam. Electronically Signed   By: [place]   On: 06/03/2020 07:03   DG Chest Port 1 View  Result Date: 05/24/2020 CLINICAL DATA:  COVID-19 positive. Hypoxia. EXAM: PORTABLE CHEST 1 VIEW COMPARISON:  February 25, 2018 FINDINGS: Cardiomediastinal silhouette is normal. Mediastinal contours appear intact. There is no evidence of pleural effusion or pneumothorax. Bilateral patchy airspace consolidation with lower lobe predominance, worse in the left lower lung field, consistent with multifocal pneumonia. Osseous structures are without acute abnormality. Soft tissues are grossly normal. IMPRESSION: Bilateral patchy airspace consolidation with lower lobe predominance, worse in the left lower lung field, consistent with multifocal pneumonia. This may represent a viral pneumonia secondary to  COVID-19 infection, however superimposed lobar pneumonia in the left lower lung field cannot be excluded. Electronically Signed   By: Fidela Salisbury M.D.   On: 06/02/2020 13:09   DG Abd Portable 1V  Result Date: 06/16/2020 CLINICAL DATA:  Check feeding catheter placement EXAM: PORTABLE ABDOMEN - 1 VIEW COMPARISON:  06/08/2020 FINDINGS: Previously seen gastric catheter is been removed. Weighted feeding catheter is now noted in the distal stomach/proximal duodenum. Calcification is noted in the right abdomen likely related to renal stone/gallstone. IMPRESSION: Feeding catheter is described Electronically Signed   By: Inez Catalina M.D.   On: 06/16/2020 14:24   ECHOCARDIOGRAM COMPLETE  Result Date: 06/04/2020    ECHOCARDIOGRAM REPORT   Patient Name:   Jim Clarke Date of Exam: 06/04/2020 Medical Rec #:  [id number]     Height:       67.0 in Accession #:    [id number]    Weight:       186.3 lb Date of Birth:  09/29/1941     BSA:          1.962 m Patient Age:    38 years      BP:           97/40 mmHg Patient Gender: M             HR:           81 bpm. Exam Location:  Inpatient Procedure: 2D Echo, Cardiac Doppler and Color Doppler Indications:    CHF  History:        Patient has no prior history of Echocardiogram examinations.                 Risk Factors:Hypertension  and Diabetes. Covid pneumonia, resp.                 failure.  Sonographer:    Dustin Flock Referring Phys: [phone] Omar Person  Sonographer Comments: Echo performed with patient supine and on artificial respirator. IMPRESSIONS  1. Left ventricular ejection fraction, by estimation, is 55 to 60%. The left ventricle has normal function. The left ventricle has no regional wall motion abnormalities. Left ventricular diastolic parameters were normal.  2. Right ventricular systolic function is normal. The right ventricular size is normal.  3. The mitral valve is normal in structure. Trivial mitral valve regurgitation. No evidence of mitral  stenosis.  4. The aortic valve is tricuspid. Aortic valve regurgitation is not visualized. Mild aortic valve sclerosis is present, with no evidence of aortic valve stenosis. Comparison(s): No prior Echocardiogram. Conclusion(s)/Recommendation(s): Normal biventricular function without evidence of hemodynamically significant valvular heart disease. FINDINGS  Left Ventricle: Left ventricular ejection fraction, by estimation, is 55 to 60%. The left ventricle has normal function. The left ventricle has no regional wall motion abnormalities. The left ventricular internal cavity size was normal in size. There is  borderline left ventricular hypertrophy. Left ventricular diastolic parameters were normal. Right Ventricle: The right ventricular size is normal. No increase in right ventricular wall thickness. Right ventricular systolic function is normal. Left Atrium: Left atrial size was normal in size. Right Atrium: Right atrial size was not well visualized. Pericardium: Trivial pericardial effusion is present. Mitral Valve: The mitral valve is normal in structure. Trivial mitral valve regurgitation. No evidence of mitral valve stenosis. Tricuspid Valve: The tricuspid valve is normal in structure. Tricuspid valve regurgitation is trivial. No evidence of tricuspid stenosis. Aortic Valve: The aortic valve is tricuspid. Aortic valve regurgitation is not visualized. Mild aortic valve sclerosis is present, with no evidence of aortic valve stenosis. There is mild calcification of the aortic valve. Aortic valve mean gradient measures 1.0 mmHg. Pulmonic Valve: The pulmonic valve was not well visualized. Pulmonic valve regurgitation is not visualized. Aorta: The aortic root is normal in size and structure. Venous: IVC assessment for right atrial pressure unable to be performed due to mechanical ventilation. IAS/Shunts: The atrial septum is grossly normal.  LEFT VENTRICLE PLAX 2D LVIDd:         4.50 cm LVIDs:         3.10 cm LV PW:          1.20 cm LV IVS:        1.10 cm LVOT diam:     2.40 cm LVOT Area:     4.52 cm  RIGHT VENTRICLE RVSP:           49.2 mmHg LEFT ATRIUM           Index       RIGHT ATRIUM LA diam:      2.90 cm 1.48 cm/m  RA Pressure: 3.00 mmHg LA Vol (A2C): 30.5 ml 15.54 ml/m  AORTIC VALVE AV Mean Grad: 1.0 mmHg  AORTA Ao Root diam: 3.00 cm MV E velocity: 65.60 cm/s    TRICUSPID VALVE MV A velocity: 7580.00 cm/s  TR Peak grad:   46.2 mmHg MV E/A ratio:  0.01          TR Vmax:        340.00 cm/s                              TR Vmean:  337.0 cm/s                              Estimated RAP:  3.00 mmHg                              RVSP:           49.2 mmHg                               SHUNTS                              Systemic Diam: 2.40 cm Buford Dresser MD Electronically signed by Buford Dresser MD Signature Date/Time: 06/04/2020/10:14:03 PM    Final    VAS [place] LOWER EXTREMITY VENOUS (DVT)  Result Date: 06/04/2020  Lower Venous DVT Study Indications: Elevated Ddimer.  Risk Factors: COVID 19 positive. Comparison Study: No prior studies. Performing Technologist: Oliver Hum RVT  Examination Guidelines: A complete evaluation includes B-mode imaging, spectral Doppler, color Doppler, and power Doppler as needed of all accessible portions of each vessel. Bilateral testing is considered an integral part of a complete examination. Limited examinations for reoccurring indications may be performed as noted. The reflux portion of the exam is performed with the patient in reverse Trendelenburg.  +---------+---------------+---------+-----------+----------+--------------+ RIGHT    CompressibilityPhasicitySpontaneityPropertiesThrombus Aging +---------+---------------+---------+-----------+----------+--------------+ CFV      Full           Yes      Yes                                 +---------+---------------+---------+-----------+----------+--------------+ SFJ      Full                                                         +---------+---------------+---------+-----------+----------+--------------+ FV Prox  Full                                                        +---------+---------------+---------+-----------+----------+--------------+ FV Mid   Full                                                        +---------+---------------+---------+-----------+----------+--------------+ FV DistalFull                                                        +---------+---------------+---------+-----------+----------+--------------+ PFV      Full                                                        +---------+---------------+---------+-----------+----------+--------------+  POP      Full           Yes      Yes                                 +---------+---------------+---------+-----------+----------+--------------+ PTV      Full                                                        +---------+---------------+---------+-----------+----------+--------------+ PERO     Full                                                        +---------+---------------+---------+-----------+----------+--------------+   +---------+---------------+---------+-----------+----------+--------------+ LEFT     CompressibilityPhasicitySpontaneityPropertiesThrombus Aging +---------+---------------+---------+-----------+----------+--------------+ CFV      Full           Yes      Yes                                 +---------+---------------+---------+-----------+----------+--------------+ SFJ      Full                                                        +---------+---------------+---------+-----------+----------+--------------+ FV Prox  Full                                                        +---------+---------------+---------+-----------+----------+--------------+ FV Mid   Full                                                         +---------+---------------+---------+-----------+----------+--------------+ FV DistalFull                                                        +---------+---------------+---------+-----------+----------+--------------+ PFV      Full                                                        +---------+---------------+---------+-----------+----------+--------------+ POP      Full           Yes      Yes                                 +---------+---------------+---------+-----------+----------+--------------+  PTV      Full                                                        +---------+---------------+---------+-----------+----------+--------------+ PERO     Full                                                        +---------+---------------+---------+-----------+----------+--------------+     Summary: RIGHT: - There is no evidence of deep vein thrombosis in the lower extremity.  - No cystic structure found in the popliteal fossa.  LEFT: - There is no evidence of deep vein thrombosis in the lower extremity.  - No cystic structure found in the popliteal fossa.  *See table(s) above for measurements and observations. Electronically signed by Monica Martinez MD on 06/04/2020 at 12:48:05 PM.    Final    [place] EKG SITE RITE  Result Date: 06/15/2020 If Site Rite image not attached, placement could not be confirmed due to current cardiac rhythm.   Microbiology Recent Results (from the past 240 hour(s))  Culture, respiratory (non-expectorated)     Status: None (Preliminary result)   Collection Time: 06/20/20  4:52 PM   Specimen: Tracheal Aspirate; Respiratory  Result Value Ref Range Status   Specimen Description   Final    TRACHEAL ASPIRATE Performed at [facility] [address], [place], [place] [address]    Special Requests   Final    NONE Performed at [facility], [place] [address] [place], [place] [address]    Gram Stain   Final     ABUNDANT WBC PRESENT, PREDOMINANTLY PMN ABUNDANT GRAM POSITIVE COCCI FEW GRAM POSITIVE RODS RARE YEAST    Culture   Final    RARE ENTEROBACTER AEROGENES CULTURE REINCUBATED FOR BETTER GROWTH Performed at [facility], [place] [address], [place], [place] [address]    Report Status PENDING  Incomplete   Organism ID, Bacteria ENTEROBACTER AEROGENES  Final      Susceptibility   Enterobacter aerogenes - MIC*    CEFAZOLIN >=64 RESISTANT Resistant     CEFEPIME <=0.12 SENSITIVE Sensitive     CEFTAZIDIME >=64 RESISTANT Resistant     CEFTRIAXONE 32 RESISTANT Resistant     CIPROFLOXACIN <=0.25 SENSITIVE Sensitive     GENTAMICIN <=1 SENSITIVE Sensitive     IMIPENEM 2 SENSITIVE Sensitive     TRIMETH/SULFA <=20 SENSITIVE Sensitive     PIP/TAZO 16 SENSITIVE Sensitive     * RARE ENTEROBACTER AEROGENES  Culture, blood (routine x 2)     Status: None (Preliminary result)   Collection Time: 06/20/20  5:26 PM   Specimen: BLOOD RIGHT HAND  Result Value Ref Range Status   Specimen Description   Final    BLOOD RIGHT HAND Performed at [facility] [address] [place], [place] [address]    Special Requests   Final    BOTTLES DRAWN AEROBIC ONLY Blood Culture adequate volume Performed at [facility] [address] [place], [place] [address]    Culture   Final    NO GROWTH 3 DAYS Performed at [facility], [address] [place], [place] [address]    Report Status PENDING  Incomplete  C Difficile Quick Screen (NO PCR Reflex)     Status: Abnormal   Collection Time: 06/21/20  2:15 AM   Specimen: STOOL  Result Value Ref Range Status   C Diff antigen POSITIVE (A) NEGATIVE Final   C Diff toxin POSITIVE (A) NEGATIVE Final   C Diff interpretation Toxin producing C. difficile detected.  Final    Comment: CRITICAL RESULT CALLED TO, READ BACK BY AND VERIFIED WITH: HAMILTON, L., RN @ 0500 06/21/20 [email or web address] Performed at [facility], [place] [address] [place], [place] [address]   MRSA PCR Screening     Status: None   Collection Time: 06/21/20  8:40 AM   Specimen: Nasal Mucosa; Nasopharyngeal  Result Value Ref Range Status   MRSA by PCR NEGATIVE NEGATIVE Final    Comment:        The GeneXpert MRSA Assay (FDA approved for NASAL specimens only), is one component of a comprehensive MRSA colonization surveillance program. It is not intended to diagnose MRSA infection nor to guide or monitor treatment for MRSA infections. Performed at [facility], [place] [address] [place], [place] [address]     Lab Basic Metabolic Panel: Recent Labs  Lab 06/20/20 2030 06/21/20 0500 06/21/20 1056 06/21/20 1557 12-Jul-2020 0455  NA 133* 126* 131* 134* 134*  K 4.7 5.2* 5.7* 5.6* 5.7*  CL 101 96* 101 103 100  CO2 21* 17* 17* 20* 20*  GLUCOSE 293* 372* 455* 321* 115*  BUN 106* 115* 110* 136* 137*  CREATININE 2.13* 2.37* 2.55* 2.74* 3.27*  CALCIUM 7.2* 6.7* 6.8* 7.0* 6.9*   Liver Function Tests: Recent Labs  Lab 06/18/20 0551 06/19/20 0228 06/21/20 1056 06/21/20 1557 07-12-2020 0455  AST 30 29 46* 48* 94*  ALT 23 24 40 42 53*  ALKPHOS 73 65 85 80 68  BILITOT 0.6 0.4 0.6 0.6 0.6  PROT 5.4* 5.1* 4.5* 4.7* 4.1*  ALBUMIN 2.3* 2.0* 1.4* 1.6* 1.3*   No results for input(s): LIPASE, AMYLASE in the last 168 hours. No results for input(s): AMMONIA in the last 168 hours. CBC: Recent Labs  Lab 06/17/20 0405 06/18/20 0551 06/19/20 0228 06/20/20 0400 06/21/20 1127 06/21/20 1557 July 12, 2020 0455  WBC 9.7   < > 7.8 9.4 12.6* 14.2* 9.5  NEUTROABS 8.7*  --   --   --   --   --   --   HGB 11.7*   < > 11.7* 12.0* 13.4 13.9 14.0  HCT 36.9*   < > 36.2* 36.7* 41.1 42.3 43.1  MCV 101.1*   < > 97.8 97.1 98.1 96.6 97.3  PLT 94*   < > 97* 124* 137* 170 173   < > = values in this interval not displayed.   Cardiac Enzymes: No results for input(s): CKTOTAL, CKMB, CKMBINDEX, TROPONINI in the last 168  hours. Sepsis Labs: Recent Labs  Lab 06/20/20 0400 06/20/20 2030 06/21/20 0500 06/21/20 1127 06/21/20 1557 2020-07-12 0455  PROCALCITON  --  6.01 7.22  --   --  13.71  WBC 9.4  --   --  12.6* 14.2* 9.5  LATICACIDVEN  --  1.9  --   --   --   --     Procedures/Operations     Marisha Renier A Nili Honda 06/23/2020, 9:06 PM

## 2020-07-14 DEATH — deceased

## 2021-02-18 IMAGING — DX DG CHEST 1V PORT
1 series · 1 of 1 positions shown · non-contrast
Comparison: 06/11/2020

CLINICAL DATA: Acute respiratory failure.

EXAM:
PORTABLE CHEST 1 VIEW

[chest ap]
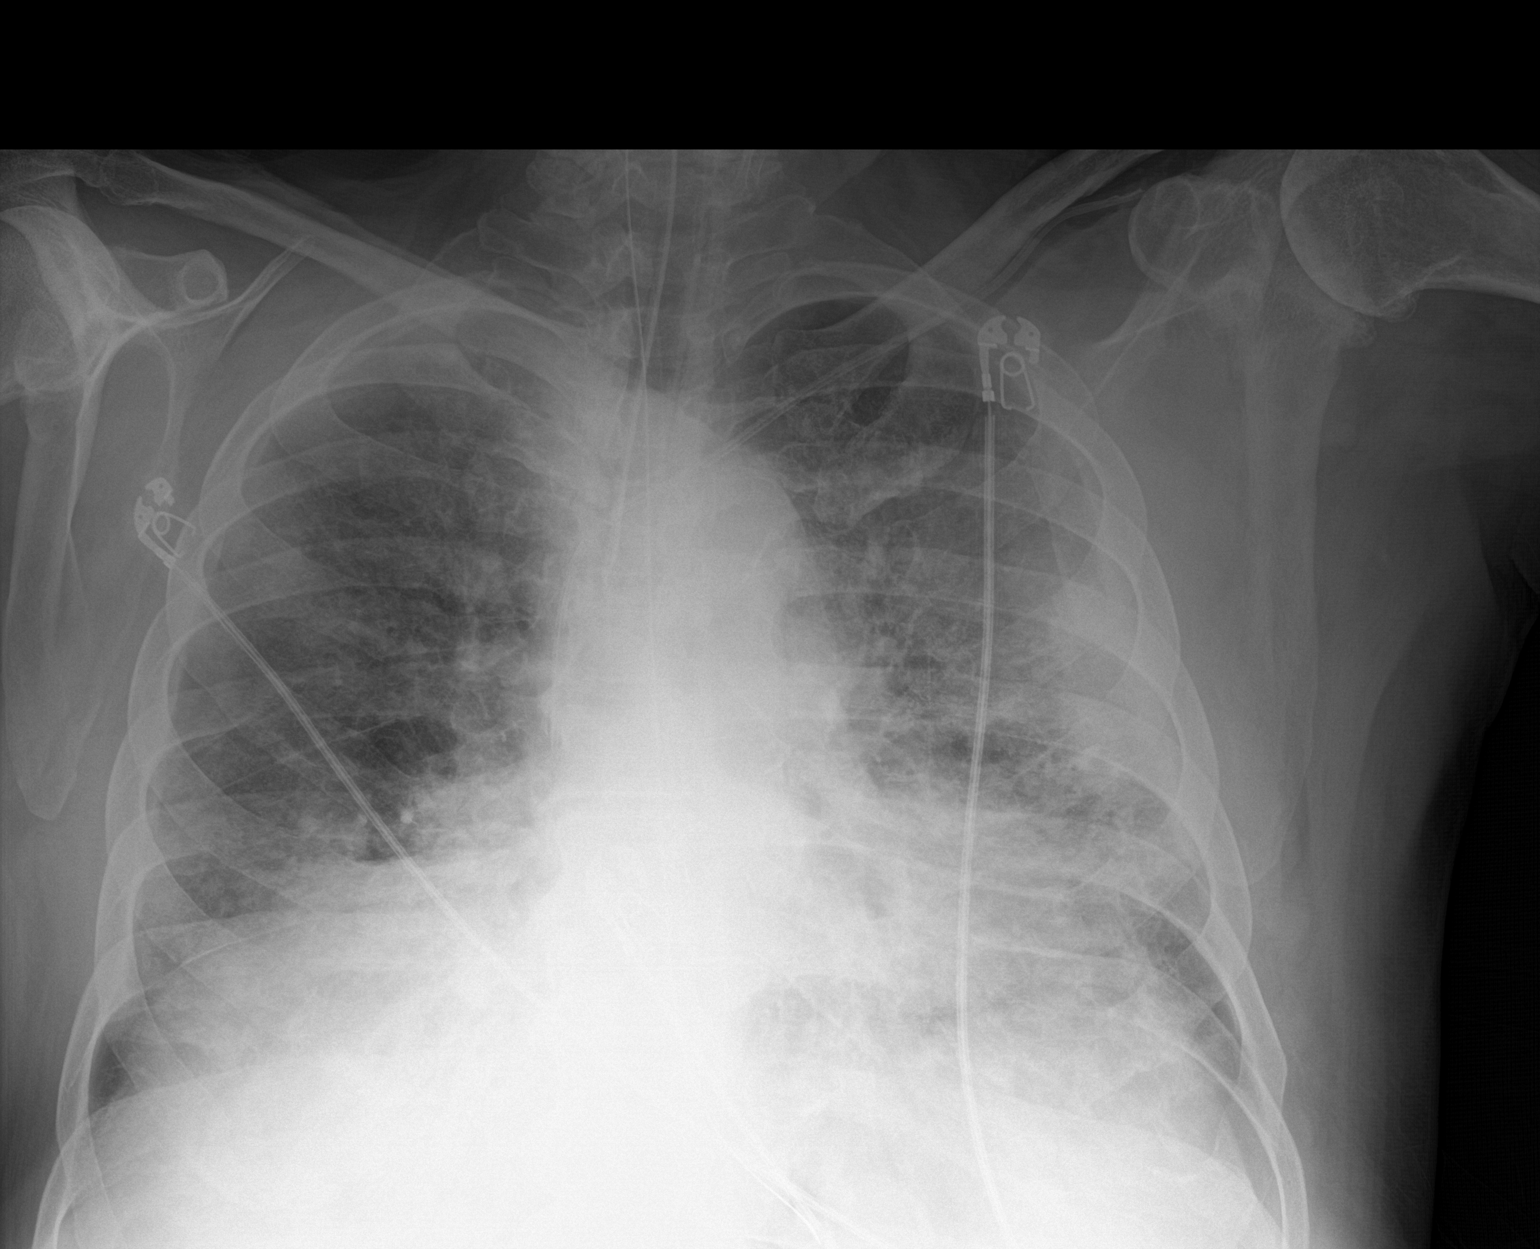

[1 of 1 positions shown; findings below may reference images not displayed]

FINDINGS: 8768 hours. Endotracheal tube tip is 1.5 cm above the base of the
carina. The NG tube passes into the stomach although the distal tip
position is not included on the film. Left subclavian central line
tip overlies the mid to distal SVC level. Diffuse interstitial and
left greater than right diffuse airspace disease is similar to
prior. No substantial pleural effusion.
IMPRESSION: No substantial interval change in exam.

## 2021-02-22 IMAGING — DX DG CHEST 1V PORT
1 series · 1 of 1 positions shown · non-contrast
Comparison: June 15, 2020

CLINICAL DATA: Hypoxia

EXAM:
PORTABLE CHEST 1 VIEW

[chest ap]
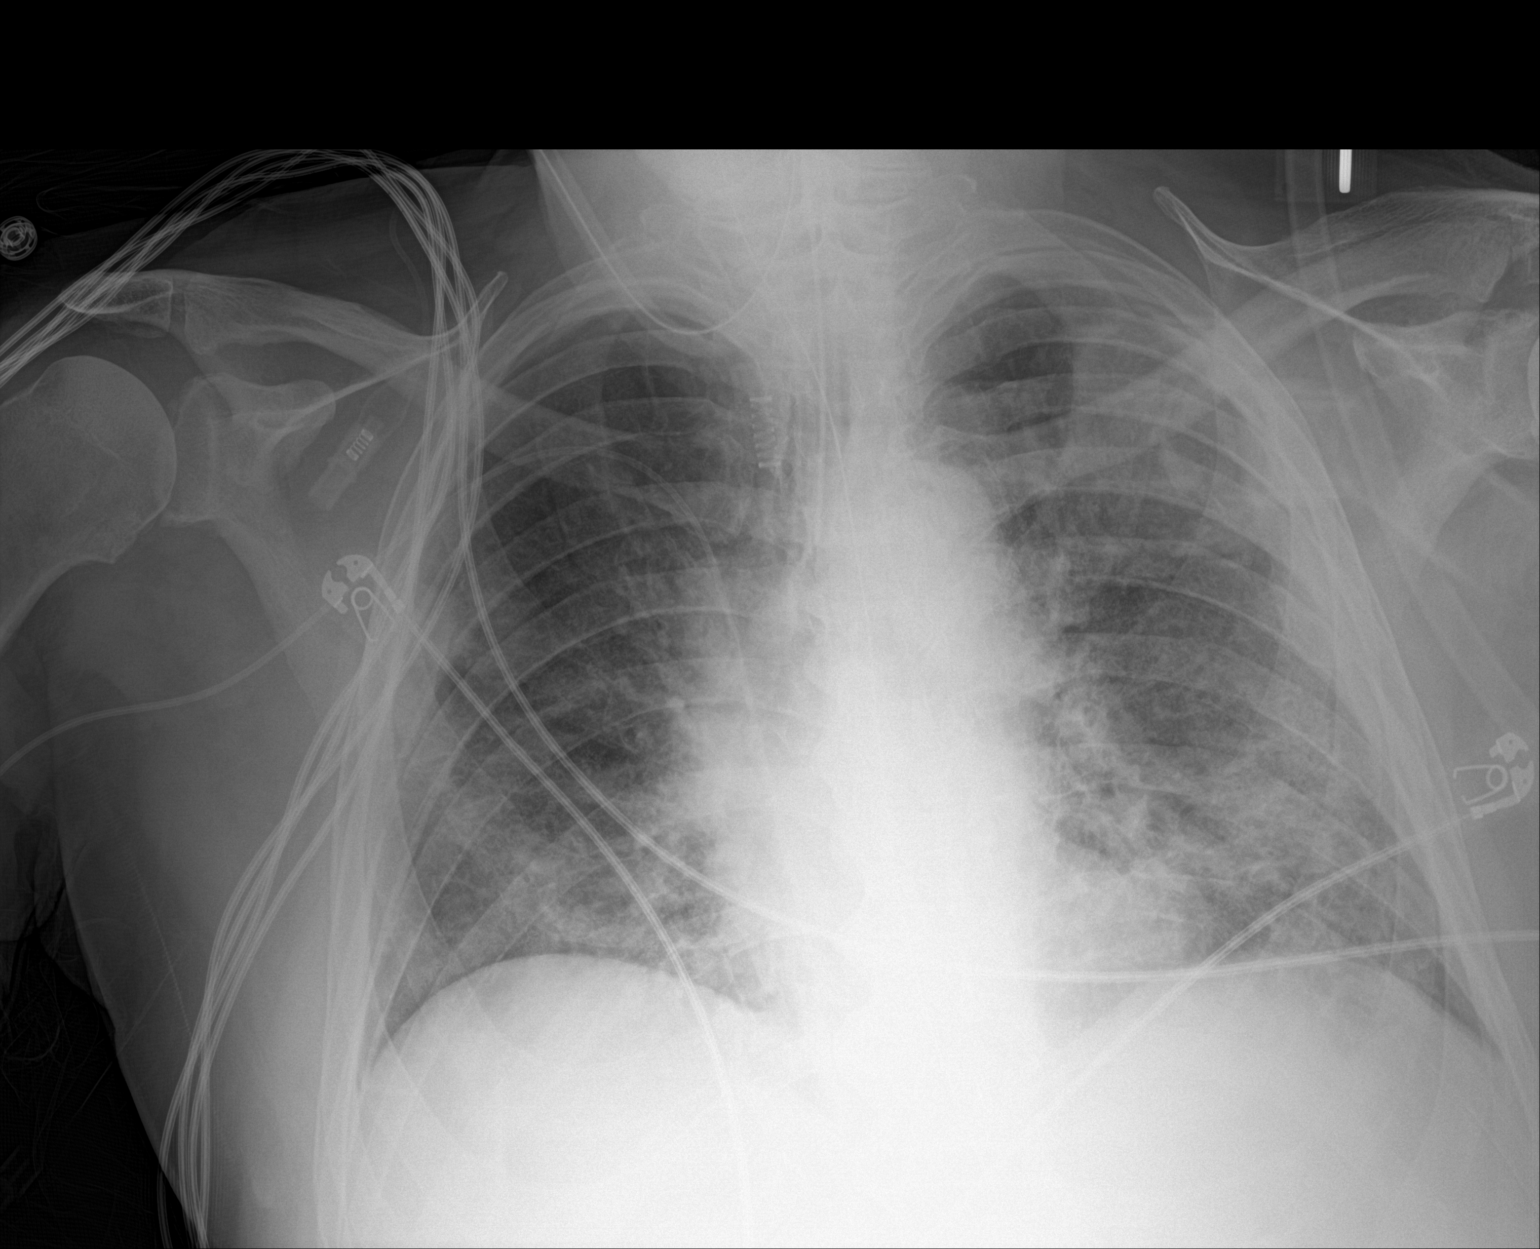

[1 of 1 positions shown; findings below may reference images not displayed]

FINDINGS: Endotracheal tube tip is 2.4 cm above the carina. Nasogastric tube
tip and side port are below the diaphragm. Central catheter tip is
in the right atrium, slightly beyond the cavoatrial junction. No
pneumothorax. There is ill-defined airspace opacity in both lower
lung regions. Heart size and pulmonary vascular normal. No
adenopathy. No bone lesions.
IMPRESSION: Tube and catheter positions as described without pneumothorax.
Persistent patchy airspace opacity in the lung bases. Suspect
multifocal pneumonia as most likely etiology. Stable cardiac
silhouette. Appearance similar to 1 day prior.

## 2021-02-23 IMAGING — DX DG CHEST 1V PORT
1 series · 1 of 1 positions shown · non-contrast
Comparison: June 16, 2020

CLINICAL DATA: Hypoxia

EXAM:
PORTABLE CHEST 1 VIEW

[chest ap]
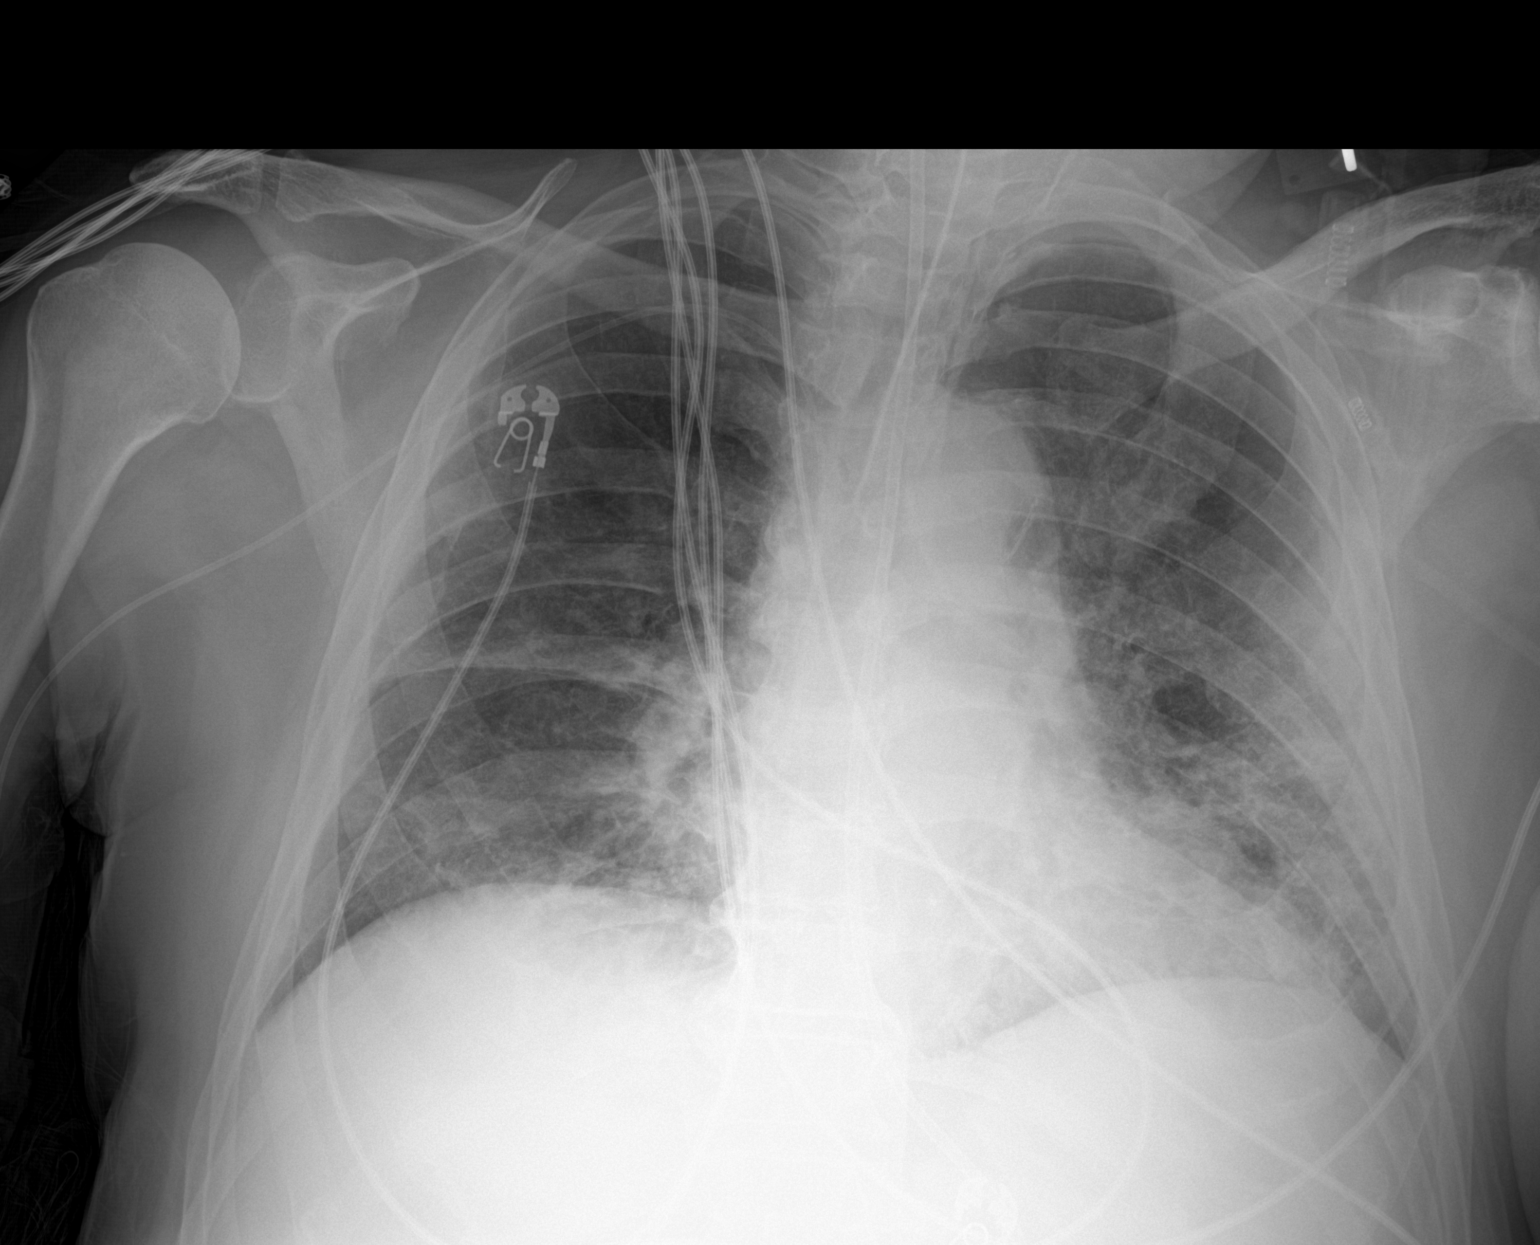

[1 of 1 positions shown; findings below may reference images not displayed]

FINDINGS: Endotracheal tube tip is 2.3 cm above the carina. Feeding tube tip
is below the diaphragm. Central catheter tip is in the right atrium,
stable. There is ill-defined airspace opacity in the left mid lung
region as well as in the lung base regions bilaterally, minimally
increased in the left mid lung and stable in the bases. Heart size
and pulmonary vascularity are within normal limits. There is aortic
atherosclerosis. No bone lesions.
IMPRESSION: Tube and catheter positions as described without pneumothorax.
Patchy airspace opacity bilaterally, slightly increased in the left
mid lung and otherwise stable. Appearance is most indicative of
multifocal pneumonia. Stable cardiac silhouette.

Aortic Atherosclerosis ([id number]).

## 2021-02-25 IMAGING — DX DG CHEST 1V PORT
1 series · 1 of 1 positions shown · non-contrast
Comparison: 06/18/2020

CLINICAL DATA: Acute respiratory failure

EXAM:
PORTABLE CHEST 1 VIEW

[chest ap]
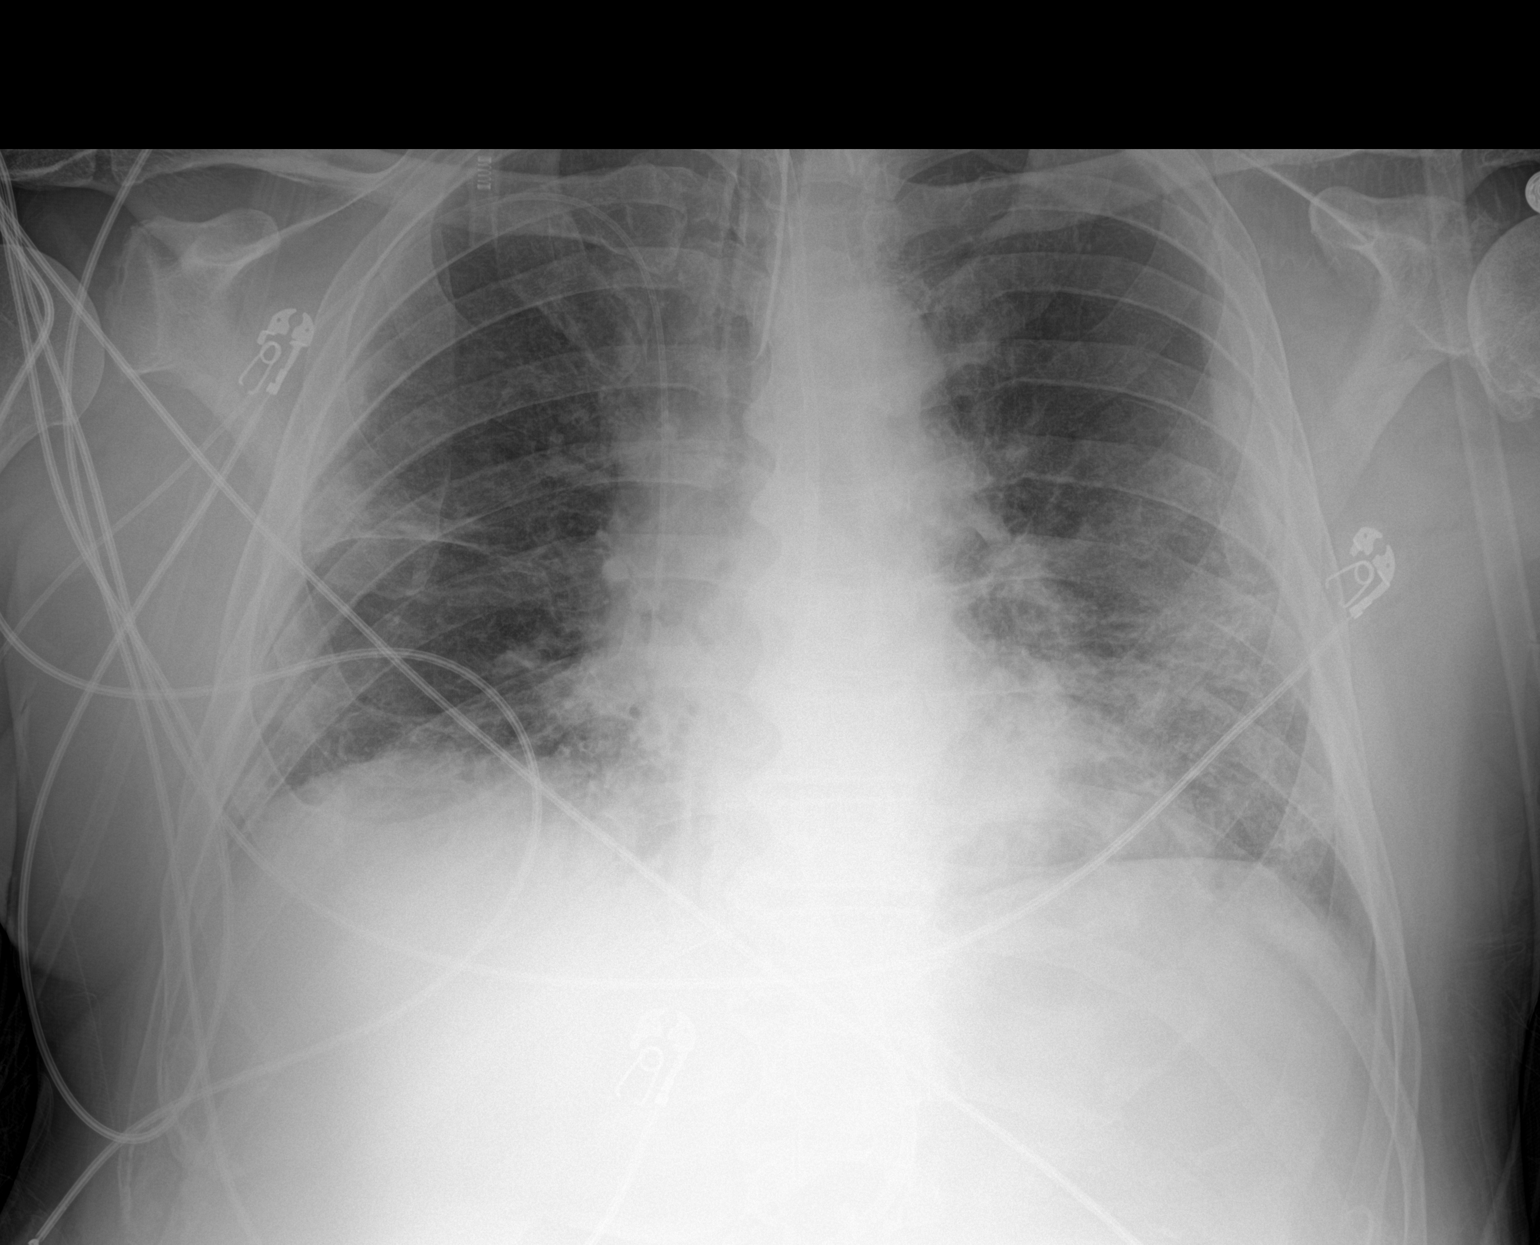

[1 of 1 positions shown; findings below may reference images not displayed]

FINDINGS: Endotracheal tube terminates 4 cm above the carina.

Right arm PICC terminates the cavoatrial junction.

Multifocal patchy opacities in the right upper lobe, lingula, and
bilateral lower lobes, suspicious for multifocal pneumonia.
Appearance is grossly unchanged. No pleural effusion or
pneumothorax.

The heart is normal in size.
IMPRESSION: Multifocal pneumonia, grossly unchanged.

Endotracheal tube terminates 4 cm above the carina. Stable right arm
PICC.

## 2021-04-01 ENCOUNTER — Ambulatory Visit: Payer: Medicare HMO | Admitting: Family Medicine
